# Patient Record
Sex: Male | Born: 1965 | Race: Black or African American | Hispanic: No | Marital: Married | Smoking: Never smoker
Health system: Southern US, Community
[De-identification: ages and names within clinical notes are randomized; demographics above are authoritative.]

## PROBLEM LIST (undated history)

## (undated) DIAGNOSIS — N289 Disorder of kidney and ureter, unspecified: Secondary | ICD-10-CM

## (undated) DIAGNOSIS — M779 Enthesopathy, unspecified: Secondary | ICD-10-CM

## (undated) DIAGNOSIS — M542 Cervicalgia: Secondary | ICD-10-CM

## (undated) DIAGNOSIS — I4891 Unspecified atrial fibrillation: Secondary | ICD-10-CM

## (undated) DIAGNOSIS — E119 Type 2 diabetes mellitus without complications: Secondary | ICD-10-CM

## (undated) DIAGNOSIS — I10 Essential (primary) hypertension: Secondary | ICD-10-CM

## (undated) DIAGNOSIS — M79603 Pain in arm, unspecified: Secondary | ICD-10-CM

## (undated) HISTORY — PX: NERVE SURGERY: SHX1016

---

## 2003-04-20 ENCOUNTER — Emergency Department (HOSPITAL_COMMUNITY): Admission: EM | Admit: 2003-04-20 | Discharge: 2003-04-20 | Payer: Self-pay | Admitting: Internal Medicine

## 2003-04-20 ENCOUNTER — Encounter: Payer: Self-pay | Admitting: *Deleted

## 2005-04-20 ENCOUNTER — Inpatient Hospital Stay (HOSPITAL_COMMUNITY): Admission: AD | Admit: 2005-04-20 | Discharge: 2005-04-22 | Payer: Self-pay | Admitting: Family Medicine

## 2005-04-21 ENCOUNTER — Ambulatory Visit: Payer: Self-pay | Admitting: *Deleted

## 2005-05-10 ENCOUNTER — Ambulatory Visit: Payer: Self-pay | Admitting: Cardiology

## 2007-12-26 ENCOUNTER — Emergency Department (HOSPITAL_COMMUNITY): Admission: EM | Admit: 2007-12-26 | Discharge: 2007-12-26 | Payer: Self-pay | Admitting: *Deleted

## 2008-06-14 ENCOUNTER — Ambulatory Visit (HOSPITAL_COMMUNITY): Admission: RE | Admit: 2008-06-14 | Discharge: 2008-06-14 | Payer: Self-pay | Admitting: Family Medicine

## 2008-06-14 ENCOUNTER — Encounter: Payer: Self-pay | Admitting: Orthopedic Surgery

## 2008-07-08 ENCOUNTER — Encounter: Payer: Self-pay | Admitting: Orthopedic Surgery

## 2008-07-15 ENCOUNTER — Ambulatory Visit: Payer: Self-pay | Admitting: Orthopedic Surgery

## 2008-07-15 DIAGNOSIS — M7580 Other shoulder lesions, unspecified shoulder: Secondary | ICD-10-CM

## 2008-07-15 DIAGNOSIS — M25519 Pain in unspecified shoulder: Secondary | ICD-10-CM | POA: Insufficient documentation

## 2008-07-15 DIAGNOSIS — S53106A Unspecified dislocation of unspecified ulnohumeral joint, initial encounter: Secondary | ICD-10-CM | POA: Insufficient documentation

## 2008-07-15 DIAGNOSIS — M25819 Other specified joint disorders, unspecified shoulder: Secondary | ICD-10-CM | POA: Insufficient documentation

## 2008-07-17 ENCOUNTER — Telehealth: Payer: Self-pay | Admitting: Orthopedic Surgery

## 2011-04-16 NOTE — Procedures (Signed)
NAMESEMAJ, COBURN                 ACCOUNT NO.:  [id number]   MEDICAL RECORD NO.:  [id number]          PATIENT TYPE:  INP   LOCATION:                                FACILITY:  [facility]   PHYSICIAN:  Donna Bernard, M.D.DATE OF BIRTH:  03/08/1966   DATE OF PROCEDURE:  04/22/2005  DATE OF DISCHARGE:  04/22/2005                                EKG INTERPRETATION   Electrocardiogram reveals normal sinus rhythm.  There are nonspecific ST-T  wave changes noted.  Voltage is on the low side on the limb leads.  There is  a small Q wave in lead 3 which is felt to be clinically nonsignificant.      Donna Bernard, M.D.  Electronically Signed     WSL/MEDQ  D:  09/06/2005  T:  09/06/2005  Job:  [id number]

## 2011-04-16 NOTE — Procedures (Signed)
NAMEKAUAN, KLOOSTERMAN                 ACCOUNT NO.:  [id number]   MEDICAL RECORD NO.:  [id number]          PATIENT TYPE:  INP   LOCATION:  IC07                          FACILITY:  [facility]   PHYSICIAN:  [place] Bing, M.D.  DATE OF BIRTH:  [birth year]   DATE OF PROCEDURE:  04/21/2005  DATE OF DISCHARGE:                                  ECHOCARDIOGRAM   PROCEDURE:  Echocardiogram.   CLINICAL DATA:  A 45 year old gentleman with atrial fibrillation.   DESCRIPTION OF PROCEDURE:  M-mode aorta 3.3.   Left atrium 3.9.   Septum 1.5.   Posterior wall 1.1.   LV diastole 4.0.   LV systole 2.7.   IMPRESSION:  1.  Technically adequate echocardiographic study.  2.  Normal left atrium, right atrium, and right ventricle.  3.  Normal aortic, mitral, and tricuspid, and pulmonic valve; flat mitral      coaptation without definite prolapse.  4.  Normal Doppler study with physiologic tricuspid regurgitation; estimated      right ventricular systolic pressure is normal.  5.  Normal proximal pulmonary artery.  6.  Normal left ventricular size; minimal septal hypertrophy, particularly      proximally.  Normal regional and global function.  7.  Normal inferior vena cava.      RR/MEDQ  D:  04/21/2005  T:  04/21/2005  Job:  [id number]

## 2011-04-16 NOTE — Group Therapy Note (Signed)
Philip Caldwell, Philip Caldwell                 ACCOUNT NO.:  [id number]   MEDICAL RECORD NO.:  [id number]          PATIENT TYPE:  INP   LOCATION:  IC07                          FACILITY:  [facility]   PHYSICIAN:  Scott A. Gerda Diss, MD    DATE OF BIRTH:  May 25, 1966   DATE OF PROCEDURE:  04/23/2004  DATE OF DISCHARGE:  04/22/2005                                   PROGRESS NOTE   HISTORY OF PRESENT ILLNESS:  The patient is now normal sinus rhythm. Denies  any chest tightness or pressure pain. No fever, chills, or vomiting.   PHYSICAL EXAMINATION:  LUNGS:  Clear.  HEART:  Regular. Chest wall non-tender. Normal EKG A/P. Atrial fibrillation  new onset, resolved.   PLAN:  Will have him see the cardiologist today but expect him to be able to  go home. Recommend followup with cardiologist as a direct, plus also  followup with Dr. Brett Canales within the next few weeks. For right now, to be on  aspirin only. Cardiologist does not recommend any ongoing medication.       SAL/MEDQ  D:  04/23/2005  T:  04/24/2005  Job:  [id number]

## 2011-04-16 NOTE — H&P (Signed)
Philip Caldwell, Philip Caldwell                 ACCOUNT NO.:  [id number]   MEDICAL RECORD NO.:  [id number]          PATIENT TYPE:  INP   LOCATION:  IC07                          FACILITY:  [facility]   PHYSICIAN:  Donna Bernard, M.D.DATE OF BIRTH:  December 20, 1965   DATE OF ADMISSION:  04/20/2005  DATE OF DISCHARGE:  LH                                HISTORY & PHYSICAL   CHIEF COMPLAINT:  Rapid heart rate.   SUBJECTIVE:  This patient is a 45 year old black male with a benign prior  medical history who presents to the office the day of admission with a sense  of a rapid heart rate. He woke up mid afternoon, noticing that his heart was  going quite fast and felt it skipping. He had no chest pain or pressure. No  shortness of breath. No nausea or diaphoresis. No history of prior  arrhythmia type problems or symptomatology. The patient notes recently he  has been under some increased stress with some issues at work and regarding  remodeling his home. He has no history of thyroid disease. No drug abuse. No  alcohol or tobacco abuse. Recently, we did a lipid profile with his total  cholesterol 190, triglycerides 167, HDL 43, LDL 114.   FAMILY HISTORY:  Interestingly, the patient has a sibling with heart rhythm  problems who recently required a pacemaker. Also positive for colon cancer,  brain cancer.   PRIOR SURGERIES:  None.   PRIOR HOSPITALIZATIONS:  None.   ALLERGIES:  PENICILLIN.   SOCIAL HISTORY:  The patient works as a Control and instrumentation engineer, is married, two  children, no tobacco or alcohol use is noted.   REVIEW OF SYSTEMS:  Otherwise negative.   PHYSICAL EXAMINATION:  VITAL SIGNS:  Blood pressure 126/70, heart rate 140  upon presentation and irregular.  GENERAL:  The patient is alert. No acute distress.  HEENT:  Normal fundi, disks sharp. TMs normal.  NECK:  Supple.  LUNGS:  Clear.  HEART:  Significant tachycardia noted. Irregular. No significant murmurs.  ABDOMEN:  Soft.  EXTREMITIES:  Normal.  NEUROLOGICAL:  Intact.   SIGNIFICANT LABORATORY DATA:  EKG:  Atrial fibrillation with an uncontrolled  rate of approximately 140 beats per minute. Blood work pending.   IMPRESSION:  New onset atrial fibrillation.   PLAN:  Check appropriate blood work. Schedule echocardiogram. Control rate  with Cardizem bolus and Cardizem drip. Initiate anticoagulant procedures  with Lovenox. [facility] cardiology consultation. Further orders in the chart.       WSL/MEDQ  D:  04/21/2005  T:  04/21/2005  Job:  [id number]

## 2011-04-16 NOTE — Consult Note (Signed)
NAME:  Philip Caldwell, Philip Caldwell                 ACCOUNT NO.:  [id number]   MEDICAL RECORD NO.:  [id number]          PATIENT TYPE:  INP   LOCATION:  IC07                          FACILITY:  [facility]   PHYSICIAN:  Vida Roller, M.D.   DATE OF BIRTH:  09-07-1966   DATE OF CONSULTATION:  04/21/2005  DATE OF DISCHARGE:                                   CONSULTATION   PRIMARY:  Dr. Lubertha South   HISTORY OF PRESENT ILLNESS:  Philip Caldwell is a 45 year old man with no  significant past medical history who presents with acute onset of  palpitations approximately 24 hours ago.  He was admitted by the  hospitalists group with rapid ventricular response, atrial fibrillation, and  was treated with subcutaneous Lovenox and IV diltiazem with subsequent  control of his heart rate.  We were asked to evaluate him for potential  treatment.  He states that he works nights for [employer] and that he has been  recently kind of burning the midnight oil as he says without a whole heck of  a lot of sleep and noticed when he awakened he had sudden onset of  palpitations.  No discomfort in his chest.  No PND, orthopnea.  No lower  extremity edema.  No syncope or presyncope.  Presented to the emergency  department for evaluation and was subsequently admitted.   PAST MEDICAL HISTORY:  Almost completely normal.  He has had no previous  surgeries.  He does not take any medications.  He does not smoke, drink, or  use illicit drugs.  He does not use any over-the-counter supplements.   FAMILY HISTORY:  His mother is alive and relatively healthy.  His father  died of cancer in his 31s.  He has one sister who has an arrhythmia problem  that required a pacemaker a history of thyroid problems, but no other  siblings with cardiovascular problems.   REVIEW OF SYSTEMS:  He denies any headache, visual changes, sinus  tenderness, sinus discharge, cough, production of sputum, shortness of  breath, wheezing.  He denies any thyroid problems,  problems with his neck,  difficulty swallowing.  No odynophagia or dysphasia.  He denies any  shortness of breath.  No wheezing, no sputum production.  No hemoptysis.  No  abdominal pain.  No hematemesis or melena.  No hematochezia.  No colitis,  jaundice.  No abdominal pain.  No lower extremity edema.  No musculoskeletal  problems.  No neurologic problems.  No skin rashes.  The remainder of review  of systems is negative.   PHYSICAL EXAMINATION:  VITAL SIGNS:  His heart rate is irregular, but around  60-70.  His blood pressure is 116/70.  His respirations were 14.  He is  afebrile.  HEENT:  Unremarkable.  NECK:  Supple.  There is no jugular venous distension, no carotid bruits.  His thyroid is normal size in the midline.  He has normal carotid upstrokes.  CHEST:  Clear to auscultation.  CARDIAC:  Irregular with no obvious murmur.  No third or fourth heart sound  is noted.  ABDOMEN:  Soft,  nontender with normoactive bowel sounds.  EXTREMITIES:  Lower extremities are without clubbing, cyanosis, or edema.  Pulses are 2+ throughout.  There are no bruits noted.  NEUROLOGIC:  Normal.  MUSCULOSKELETAL:  Normal.   LABORATORIES:  White blood cell count 8.9, H&H of 16 and 46, platelet count  340.  Sodium 134, potassium 3.4, chloride 103, bicarbonate 27, BUN 8,  creatinine 1.2, blood sugar 94.  Cardiac enzymes x2 were negative.  Chest x-  ray is unremarkable.  Abdominal x-ray shows a question of ileus.  CT scan of  the abdomen shows fatty liver and question of renal cysts.  There is some  question of pancreatitis on the abdominal scan, although it was not fairly  certain.  This was a definitive diagnosis.   CURRENT MEDICATIONS:  1.  Lovenox subcutaneous 1 mg/kg b.i.d.  2.  Potassium chloride 20 mEq a day.  3.  IV diltiazem.  4.  Tylenol p.r.n.   His electrocardiogram shows atrial fibrillation at a rate of 68 with normal  QRS duration at 78 milliseconds, QT corrected at 404.  His QRS axis  is  normal at 54.  He has no ischemic ST-T wave changes and there are no Q-waves  concerning for an old myocardial infarction.   ASSESSMENT:  1.  Atrial fibrillation, unknown etiology.  History of atrial fibrillation      with associated thyroid disease in a sister.  His heart rate is well-      controlled on IV diltiazem and he is adequately anticoagulated with      Lovenox although I am concerned that no PT/PTT was obtained prior to      starting the Lovenox.  2.  Hypokalemia, unknown etiology.  He is being replaced.  Probably would be      reasonable to recheck his potassium and his magnesium relatively soon      and continue to address this.  3.  Ileus on abdominal x-ray.  He does not appear to clinically have that      and he really denies any significant abdominal problems.  I am not sure      how to follow up on this and I will discuss this with his primary.   RECOMMENDATIONS:  1.  Check a PTT, probably check a urine drug screen as well.  2.  Check potassium and magnesium probably around noon today and then      supplemental those appropriately.  3.  Continue the Lovenox as soon as PTT is obtained.  4.  Start p.o. diltiazem and then titrate him off the IV diltiazem.  He      needs an echocardiogram.  We probably should check a set of thyroid      function studies and I would make him n.p.o. after midnight tonight and      if he is still in atrial fibrillation in the morning he can be      cardioverted without any particular concern if his surface      echocardiogram appears to be unremarkable.  The question regarding      whether to put him on Coumadin or not will be determined once the      echocardiogram has been reviewed.      JH/MEDQ  D:  04/21/2005  T:  04/21/2005  Job:  [id number]

## 2011-04-16 NOTE — Discharge Summary (Signed)
Philip Caldwell, Philip Caldwell                 ACCOUNT NO.:  [id number]   MEDICAL RECORD NO.:  [id number]          PATIENT TYPE:  IC07   LOCATION:                                FACILITY:  [facility]   PHYSICIAN:  Donna Bernard, M.D.DATE OF BIRTH:  06-Aug-1966   DATE OF ADMISSION:  DATE OF DISCHARGE:  05/25/2006LH                                 DISCHARGE SUMMARY   DISCHARGE DIAGNOSIS:  New-onset atrial fibrillation.   DISPOSITION:  Discharged to home.   DISCHARGE MEDICATIONS:  Enteric coated aspirin 325 mg daily.   HISTORY OF PRESENT ILLNESS:  Please see H&P as dictated.   HOSPITAL COURSE:  This patient is a 45 year old, African-American male with  a benign medical history who presented to the office on the day of admission  with the sense of a rapid heart rate.  He was noted to be significantly  tachycardic on exam at approximately 140 beats per minute and irregular.  An  EKG revealed new-onset atrial fibrillation.  The patient was admitted to the  hospital.  He was started on appropriate anticoagulation with Lovenox.  The  patient was given a Cardizem bolus and then drip to initiate control of the  heart rate.  Cardiac enzymes were done and these proved to be negative.  Potassium was shown to be borderline low at 3.4.  Based on this, potassium  was supplemented.  The cardiology folks were consulted as noted.  Echocardiogram revealed no significant abnormalities.  Potassium was  corrected with supplementation.  The patient on the second day of admission  spontaneously reverted to normal sinus rhythm and remained there.  On the  day of discharge, the patient was feeling fine.  Discharged home with  diagnosis and disposition as noted above.       WSL/MEDQ  D:  06/02/2005  T:  06/02/2005  Job:  [id number]

## 2012-06-10 ENCOUNTER — Encounter (HOSPITAL_COMMUNITY): Payer: Self-pay

## 2012-06-10 ENCOUNTER — Emergency Department (HOSPITAL_COMMUNITY)
Admission: EM | Admit: 2012-06-10 | Discharge: 2012-06-10 | Disposition: A | Discharge status: Home / Self Care | Payer: 59 | Attending: Emergency Medicine | Admitting: Emergency Medicine

## 2012-06-10 ENCOUNTER — Emergency Department (HOSPITAL_COMMUNITY): Payer: 59

## 2012-06-10 DIAGNOSIS — I4891 Unspecified atrial fibrillation: Secondary | ICD-10-CM | POA: Insufficient documentation

## 2012-06-10 DIAGNOSIS — J40 Bronchitis, not specified as acute or chronic: Secondary | ICD-10-CM | POA: Insufficient documentation

## 2012-06-10 DIAGNOSIS — J310 Chronic rhinitis: Secondary | ICD-10-CM

## 2012-06-10 HISTORY — DX: Unspecified atrial fibrillation: I48.91

## 2012-06-10 MED ORDER — PREDNISONE 20 MG PO TABS
60.0000 mg | ORAL_TABLET | ORAL | Status: AC
  Administered 2012-06-10: 60 mg via ORAL
  Filled 2012-06-10: qty 3

## 2012-06-10 MED ORDER — IPRATROPIUM BROMIDE 0.02 % IN SOLN
0.5000 mg | Freq: Once | RESPIRATORY_TRACT | Status: AC
  Administered 2012-06-10: 0.5 mg via RESPIRATORY_TRACT
  Filled 2012-06-10: qty 2.5

## 2012-06-10 MED ORDER — PREDNISONE 20 MG PO TABS: ORAL_TABLET | ORAL | Status: DC

## 2012-06-10 MED ORDER — ALBUTEROL SULFATE HFA 108 (90 BASE) MCG/ACT IN AERS: 2.0000 | INHALATION_SPRAY | RESPIRATORY_TRACT | Status: DC | PRN

## 2012-06-10 MED ORDER — ALBUTEROL SULFATE (5 MG/ML) 0.5% IN NEBU
5.0000 mg | INHALATION_SOLUTION | Freq: Once | RESPIRATORY_TRACT | Status: AC
  Administered 2012-06-10: 5 mg via RESPIRATORY_TRACT
  Filled 2012-06-10: qty 1

## 2012-06-10 NOTE — ED Notes (Signed)
Pt reports sinus and chest congestion since the beginning of July.  Reports was given an antibiotic and started feeling a little better.  Finished antibiotics and started feeling worse.  C/O chest pressure and difficulty breathing, and productive cough with clear sputum.

## 2012-06-10 NOTE — ED Notes (Signed)
Sick w/ sinus ans nasal congestion since July 1st, started on antibiotic by pmd, not coughing now, but feels worse.  No fever.

## 2012-06-10 NOTE — ED Provider Notes (Cosign Needed)
History   This chart was scribed for Ward Givens, MD by Sofie Rower. The patient was seen in room APA04/APA04 and the patient's care was started at 4:04 PM     CSN: [id number]  Arrival date & time 06/10/12  1535   First MD Initiated Contact with Patient 06/10/12 1550      Chief Complaint  Patient presents with  . Facial Pain  . Nasal Congestion    (Consider location/radiation/quality/duration/timing/severity/associated sxs/prior treatment) HPI  Philip Caldwell is a 46 y.o. male who presents to the Emergency Department complaining of nasal congestion onset two days ago with associated symptoms of sneezing, productive white cough, wheezing, chest feeling tight.  The pt informs the EDP that he went to see Dr. Gerda Diss July 1st, with symptoms of being stopped up (nasally), white nasal drainage difficulty breathing, productive white cough. Pt reports there was no wheezing noted, no chest x-ray performed, he was prescribed antibiotics, clarithromycin 500 mg daily for 10 days. Pt informs the EDP that he was feeling better but  that the last two days he has become worse, it has become increasingly difficult to breathe. Modifying factors include blowing nose which provides moderate relief.   Pt has a hx of atrial fibrillation, allergy to penicillin.   Pt denies fever, sore throat, nausea, vomiting, hx of asthma as a child. Pt has had wheezing in the past and used a inhaler.  PCP is Dr. Lubertha South   Past Medical History  Diagnosis Date  . A-fib     History reviewed. No pertinent past surgical history.  No family history on file.  History  Substance Use Topics  . Smoking status: Never Smoker   . Smokeless tobacco: Not on file  . Alcohol Use: No    Pt does not smoke.  Pt is employed as a Merchandiser, retail with [employer].   Review of Systems  All other systems reviewed and are negative.    10 Systems reviewed and all are negative for acute change except as noted in the HPI.    Allergies    Penicillins  Home Medications  No current outpatient prescriptions on file.  BP 150/84  Pulse 74  Temp 98.2 F (36.8 C) (Oral)  Resp 20  Ht 5\' 9"  (1.753 m)  Wt 185 lb (83.915 kg)  BMI 27.32 kg/m2  SpO2 100%  Vital signs normal    Physical Exam  Nursing note and vitals reviewed. Constitutional: He is oriented to person, place, and time. He appears well-developed and well-nourished.  HENT:  Head: Normocephalic and atraumatic.  Right Ear: External ear normal.  Left Ear: External ear normal.  Mouth/Throat: Oropharynx is clear and moist.       Nasal mucosa boggy, slightly inflamed.    Eyes: Conjunctivae and EOM are normal. Pupils are equal, round, and reactive to light.  Neck: Normal range of motion. Neck supple.  Cardiovascular: Normal rate and regular rhythm.  Exam reveals no gallop.   No murmur heard. Pulmonary/Chest: Effort normal and breath sounds normal. No respiratory distress. He has no wheezes. He has no rales.  Musculoskeletal: Normal range of motion.  Neurological: He is alert and oriented to person, place, and time.  Skin: Skin is warm and dry.  Psychiatric: He has a normal mood and affect. His behavior is normal.    ED Course  Procedures (including critical care time)  Medications  clarithromycin (BIAXIN) 500 MG tablet (not administered)  predniSONE (DELTASONE) tablet 60 mg (not administered)  albuterol (PROVENTIL HFA;VENTOLIN  HFA) 108 (90 BASE) MCG/ACT inhaler (not administered)  predniSONE (DELTASONE) 20 MG tablet (not administered)  albuterol (PROVENTIL) (5 MG/ML) 0.5% nebulizer solution 5 mg (5 mg Nebulization Given 06/10/12 1630)  ipratropium (ATROVENT) nebulizer solution 0.5 mg (0.5 mg Nebulization Given 06/10/12 1630)     4:10PM- EDP at bedside discusses treatment plan concerning breathing treatment.   5:09PM- EDP at bedside discusses treatment plan concerning x-ray results, application of steroids, application of inhaler. EDP evaluates pt breath  sounds, lungs clear, improved air movement. Pt reports he is breathing deeper, feeling better.    Dg Chest 2 View  06/10/2012  *RADIOLOGY REPORT*  Clinical Data: Cough and chest congestion.  CHEST - 2 VIEW  Comparison:  None.  Findings:  The heart size and mediastinal contours are within normal limits.  Both lungs are clear.  The visualized skeletal structures are unremarkable.  IMPRESSION: No active cardiopulmonary disease.  Original Report Authenticated By: Danae Orleans, M.D.      Date: 06/10/2012  Rate: 69  Rhythm: normal sinus rhythm  QRS Axis: normal  Intervals: normal  ST/T Wave abnormalities: normal  Conduction Disutrbances:none  Narrative Interpretation:   Old EKG Reviewed: unchanged from 12/26/2007     1. Bronchitis   2. Rhinitis    New Prescriptions   ALBUTEROL (PROVENTIL HFA;VENTOLIN HFA) 108 (90 BASE) MCG/ACT INHALER    Inhale 2 puffs into the lungs every 4 (four) hours as needed for wheezing or shortness of breath.   PREDNISONE (DELTASONE) 20 MG TABLET    Take 3 po QD x 2d starting tomorrow, then 2 po QD x 3d then 1 po QD x 3d    Plan discharge    MDM   I personally performed the services described in this documentation, which was scribed in my presence. The recorded information has been reviewed and considered.    Ward Givens, MD 06/10/12 1719

## 2012-12-09 ENCOUNTER — Other Ambulatory Visit: Payer: Self-pay

## 2012-12-09 ENCOUNTER — Encounter (HOSPITAL_COMMUNITY): Payer: Self-pay

## 2012-12-09 ENCOUNTER — Emergency Department (HOSPITAL_COMMUNITY)
Admission: EM | Admit: 2012-12-09 | Discharge: 2012-12-09 | Disposition: A | Discharge status: Home / Self Care | Payer: 59 | Attending: Emergency Medicine | Admitting: Emergency Medicine

## 2012-12-09 DIAGNOSIS — R110 Nausea: Secondary | ICD-10-CM | POA: Insufficient documentation

## 2012-12-09 DIAGNOSIS — R51 Headache: Secondary | ICD-10-CM

## 2012-12-09 DIAGNOSIS — I10 Essential (primary) hypertension: Secondary | ICD-10-CM

## 2012-12-09 LAB — COMPREHENSIVE METABOLIC PANEL
ALT: 36 U/L (ref 0–53)
Albumin: 4.1 g/dL (ref 3.5–5.2)
Alkaline Phosphatase: 83 U/L (ref 39–117)
BUN: 12 mg/dL (ref 6–23)
Chloride: 100 mEq/L (ref 96–112)
Glucose, Bld: 87 mg/dL (ref 70–99)
Potassium: 4.1 mEq/L (ref 3.5–5.1)
Sodium: 134 mEq/L — ABNORMAL LOW (ref 135–145)
Total Bilirubin: 0.4 mg/dL (ref 0.3–1.2)
Total Protein: 7.6 g/dL (ref 6.0–8.3)

## 2012-12-09 LAB — CBC WITH DIFFERENTIAL/PLATELET
Basophils Relative: 0 % (ref 0–1)
Eosinophils Relative: 2 % (ref 0–5)
Hemoglobin: 16.2 g/dL (ref 13.0–17.0)
Lymphs Abs: 2.7 10*3/uL (ref 0.7–4.0)
MCH: 29.5 pg (ref 26.0–34.0)
MCV: 80 fL (ref 78.0–100.0)
Monocytes Absolute: 1 10*3/uL (ref 0.1–1.0)
Monocytes Relative: 10 % (ref 3–12)
RBC: 5.5 MIL/uL (ref 4.22–5.81)
WBC: 10 10*3/uL (ref 4.0–10.5)

## 2012-12-09 MED ORDER — IBUPROFEN 800 MG PO TABS
800.0000 mg | ORAL_TABLET | Freq: Once | ORAL | Status: AC
  Administered 2012-12-09: 800 mg via ORAL

## 2012-12-09 MED ORDER — IBUPROFEN 800 MG PO TABS
ORAL_TABLET | ORAL | Status: AC
  Filled 2012-12-09: qty 1

## 2012-12-09 NOTE — ED Notes (Signed)
Spoke with Dr. Estell Harpin about pt's complaint, no CT ordered at this time.  Instructed to adminsiter ibuprofen 800mg .

## 2012-12-09 NOTE — ED Notes (Signed)
Pt reports bp has been elevated for past few days but worse today.  C/O headache that started around 0600 and nausea.  Reports bp at home 163/105.  Reports was diagnosed borderline htn but has never been put on any bp medication.

## 2012-12-09 NOTE — ED Provider Notes (Signed)
History    Scribed for Benny Lennert, MD, the patient was seen in room APA04/APA04. This chart was scribed by Katha Cabal.   CSN: [id number]  Arrival date & time 12/09/12  1300   First MD Initiated Contact with Patient 12/09/12 1417      Chief Complaint  Patient presents with  . Headache  . Nausea  . Hypertension    (Consider location/radiation/quality/duration/timing/severity/associated sxs/prior Treatment)  Benny Lennert, MD entered patient's room at 2:26 PM  Patient is a 47 y.o. male presenting with headaches and hypertension. The history is provided by the patient and a relative. No language interpreter was used.  Headache  This is a new problem. The current episode started more than 2 days ago. The problem occurs constantly. The problem has not changed since onset.Associated with: elevated blood pressure. The quality of the pain is described as throbbing. The pain is moderate. Associated symptoms include nausea. He has tried nothing for the symptoms.  Hypertension The problem has not changed since onset.Associated symptoms include headaches.    BP 163/105 at home.  Patient reports that past 2 weeks that BP has been elevated.  Patient has not been diagnosed with hypertension but reports that Dr. Gerda Diss wanted him to monitor his blood pressure.     PCP Harlow Asa, MD      Past Medical History  Diagnosis Date  . A-fib     History reviewed. No pertinent past surgical history.  No family history on file.  History  Substance Use Topics  . Smoking status: Never Smoker   . Smokeless tobacco: Not on file  . Alcohol Use: No      Review of Systems  Gastrointestinal: Positive for nausea.  Neurological: Positive for headaches.  All other systems reviewed and are negative.    Allergies  Penicillins  Home Medications   Current Outpatient Rx  Name  Route  Sig  Dispense  Refill  . VITAMIN C 500 MG PO TABS   Oral   Take 500 mg by mouth daily as  needed.           BP 150/103  Pulse 62  Temp 98.3 F (36.8 C) (Oral)  Resp 20  Ht 5\' 9"  (1.753 m)  Wt 180 lb (81.647 kg)  BMI 26.58 kg/m2  SpO2 95%  Physical Exam  Constitutional: He is oriented to person, place, and time. He appears well-developed.  HENT:  Head: Normocephalic and atraumatic.  Right Ear: Tympanic membrane normal.  Left Ear: Tympanic membrane normal.  Mouth/Throat: Oropharynx is clear and moist.  Eyes: Conjunctivae normal and EOM are normal. Pupils are equal, round, and reactive to light. No scleral icterus.  Neck: Neck supple. No thyromegaly present.  Cardiovascular: Normal rate and regular rhythm.  Exam reveals no gallop and no friction rub.   No murmur heard. Pulmonary/Chest: No stridor. He has no wheezes. He has no rales. He exhibits no tenderness.  Abdominal: He exhibits no distension. There is no tenderness. There is no rebound.  Musculoskeletal: Normal range of motion. He exhibits no edema.  Lymphadenopathy:    He has no cervical adenopathy.  Neurological: He is oriented to person, place, and time. Coordination normal.  Skin: No rash noted. No erythema.  Psychiatric: He has a normal mood and affect. His behavior is normal.    ED Course  Procedures (including critical care time)    DIAGNOSTIC STUDIES: Oxygen Saturation is 98% on room air normal by my interpretation.     COORDINATION  OF CARE: 2:32 PM  Physical exam complete.  Will order basic blood workup.   3:45 PM  Laboratory findings discussed with patient.  Plan to discharge patient.  Patient agrees with plan.        LABS / RADIOLOGY:   Labs Reviewed  CBC WITH DIFFERENTIAL - Abnormal; Notable for the following:    MCHC 36.8 (*)     All other components within normal limits  COMPREHENSIVE METABOLIC PANEL - Abnormal; Notable for the following:    Sodium 134 (*)     GFR calc non Af Amer 65 (*)     GFR calc Af Amer 75 (*)     All other components within normal limits   No results  found.      IMPRESSION: No diagnosis found.   NEW MEDICATIONS: New Prescriptions   No medications on file          MDM         The chart was scribed for me under my direct supervision.  I personally performed the history, physical, and medical decision making and all procedures in the evaluation of this patient.Benny Lennert, MD 12/09/12 [phone]

## 2013-05-09 ENCOUNTER — Emergency Department (HOSPITAL_COMMUNITY)
Admission: EM | Admit: 2013-05-09 | Discharge: 2013-05-09 | Disposition: A | Discharge status: Home / Self Care | Payer: 59 | Attending: Emergency Medicine | Admitting: Emergency Medicine

## 2013-05-09 ENCOUNTER — Encounter (HOSPITAL_COMMUNITY): Payer: Self-pay | Admitting: Emergency Medicine

## 2013-05-09 ENCOUNTER — Emergency Department (HOSPITAL_COMMUNITY): Payer: 59

## 2013-05-09 DIAGNOSIS — Z880 Allergy status to penicillin: Secondary | ICD-10-CM | POA: Insufficient documentation

## 2013-05-09 DIAGNOSIS — M2540 Effusion, unspecified joint: Secondary | ICD-10-CM | POA: Insufficient documentation

## 2013-05-09 DIAGNOSIS — Z8679 Personal history of other diseases of the circulatory system: Secondary | ICD-10-CM | POA: Insufficient documentation

## 2013-05-09 DIAGNOSIS — Z79899 Other long term (current) drug therapy: Secondary | ICD-10-CM | POA: Insufficient documentation

## 2013-05-09 DIAGNOSIS — M778 Other enthesopathies, not elsewhere classified: Secondary | ICD-10-CM

## 2013-05-09 DIAGNOSIS — M65839 Other synovitis and tenosynovitis, unspecified forearm: Secondary | ICD-10-CM | POA: Insufficient documentation

## 2013-05-09 MED ORDER — HYDROCODONE-ACETAMINOPHEN 5-325 MG PO TABS: ORAL_TABLET | ORAL | Status: DC

## 2013-05-09 MED ORDER — NAPROXEN 500 MG PO TABS: 500.0000 mg | ORAL_TABLET | Freq: Two times a day (BID) | ORAL | Status: DC

## 2013-05-09 NOTE — ED Provider Notes (Signed)
History     CSN: [id number]  Arrival date & time 05/09/13  1155   First MD Initiated Contact with Patient 05/09/13 1236      Chief Complaint  Patient presents with  . Wrist Pain    (Consider location/radiation/quality/duration/timing/severity/associated sxs/prior treatment) HPI Comments: Philip Caldwell is a 47 y.o. male who presents to the Emergency Department complaining of lateral left wrist pain for 2 weeks. States pain is worse with side-to-side movement of the wrist. He denies known injury. He also denies redness, numbness or swelling. He has not tried any home medications. He also denies any pain proximal to the wrist.  Patient is a 47 y.o. male presenting with wrist pain. The history is provided by the patient.  Wrist Pain Associated symptoms include arthralgias and joint swelling. Pertinent negatives include no chills or fever.    Past Medical History  Diagnosis Date  . A-fib     History reviewed. No pertinent past surgical history.  No family history on file.  History  Substance Use Topics  . Smoking status: Never Smoker   . Smokeless tobacco: Not on file  . Alcohol Use: No      Review of Systems  Constitutional: Negative for fever and chills.  Genitourinary: Negative for dysuria and difficulty urinating.  Musculoskeletal: Positive for joint swelling and arthralgias.  Skin: Negative for color change and wound.  All other systems reviewed and are negative.    Allergies  Penicillins  Home Medications   Current Outpatient Rx  Name  Route  Sig  Dispense  Refill  . verapamil (CALAN-SR) 180 MG CR tablet   Oral   Take 180 mg by mouth at bedtime.           BP 144/88  Pulse 73  Temp(Src) 98 F (36.7 C) (Oral)  Resp 20  Ht 5\' 9"  (1.753 m)  Wt 185 lb (83.915 kg)  BMI 27.31 kg/m2  SpO2 99%  Physical Exam  Nursing note and vitals reviewed. Constitutional: He is oriented to person, place, and time. He appears well-developed and well-nourished. No  distress.  HENT:  Head: Normocephalic and atraumatic.  Cardiovascular: Normal rate, regular rhythm, normal heart sounds and intact distal pulses.   No murmur heard. Pulmonary/Chest: Effort normal and breath sounds normal. No respiratory distress.  Musculoskeletal: Normal range of motion. He exhibits tenderness. He exhibits no edema.       Left wrist: He exhibits tenderness. He exhibits normal range of motion, no bony tenderness, no swelling, no effusion, no crepitus, no deformity and no laceration.       Arms: tenderness to palpation of the lateral left wrist .  Radial pulse is brisk, distal sensation intact.  CR< 2 sec.  No bruising, erythema or bony deformity.  Patient has full ROM. Positive Finkelstein test. No proximal tenderness  Neurological: He is alert and oriented to person, place, and time. He exhibits normal muscle tone. Coordination normal.  Skin: Skin is warm and dry.    ED Course  Procedures (including critical care time)  Labs Reviewed - No data to display Dg Wrist Complete Left  05/09/2013   *RADIOLOGY REPORT*  Clinical Data: Left wrist pain for 2 weeks.  LEFT WRIST - COMPLETE 3+ VIEW  Comparison: None.  Findings: Imaged bones, joints and soft tissues appear normal.  IMPRESSION: Negative exam.   Original Report Authenticated By: Holley Dexter, M.D.    Velcro wrist splint applied, pain improved, remains neurovascularly intact.    MDM  Pain is likely related to tendinitis. Do not suspect a septic joint. Patient agrees to elevate, ice, and wear the Velcro splint for at least one week. Have advised him to followup with Dr. Romeo Apple if the pain is not improving. I will treat with anti-inflammatory and Vicodin.      Bailyn Spackman L. Katty Fretwell, PA-C 05/09/13 1309

## 2013-05-09 NOTE — ED Notes (Signed)
Pt c/o left wrist pain x 2 weeks. Denies injury. C/m/s intact.

## 2013-05-09 NOTE — ED Notes (Signed)
Pt presents with left wrist pain x 2 weeks that has worsened. Denies injury/fall/trauma to said limb. No swelling noted.  Pt states pain is worse with abduction movement. NAD noted.

## 2013-05-09 NOTE — ED Provider Notes (Signed)
Medical screening examination/treatment/procedure(s) were performed by non-physician practitioner and as supervising physician I was immediately available for consultation/collaboration.   Benny Lennert, MD 05/09/13 1426

## 2013-06-25 ENCOUNTER — Other Ambulatory Visit: Payer: Self-pay | Admitting: Family Medicine

## 2014-04-02 ENCOUNTER — Emergency Department (HOSPITAL_COMMUNITY): Payer: 59

## 2014-04-02 ENCOUNTER — Emergency Department (HOSPITAL_COMMUNITY)
Admission: EM | Admit: 2014-04-02 | Discharge: 2014-04-02 | Disposition: A | Discharge status: Other Acute Inpatient Hospital | Payer: 59

## 2014-04-02 ENCOUNTER — Encounter (HOSPITAL_COMMUNITY): Payer: Self-pay | Admitting: Emergency Medicine

## 2014-04-02 ENCOUNTER — Emergency Department (HOSPITAL_COMMUNITY)
Admission: EM | Admit: 2014-04-02 | Discharge: 2014-04-02 | Disposition: A | Discharge status: Home / Self Care | Payer: 59 | Attending: Emergency Medicine | Admitting: Emergency Medicine

## 2014-04-02 DIAGNOSIS — S46812A Strain of other muscles, fascia and tendons at shoulder and upper arm level, left arm, initial encounter: Secondary | ICD-10-CM

## 2014-04-02 DIAGNOSIS — M47812 Spondylosis without myelopathy or radiculopathy, cervical region: Secondary | ICD-10-CM | POA: Insufficient documentation

## 2014-04-02 DIAGNOSIS — S43499A Other sprain of unspecified shoulder joint, initial encounter: Secondary | ICD-10-CM | POA: Insufficient documentation

## 2014-04-02 DIAGNOSIS — S46819A Strain of other muscles, fascia and tendons at shoulder and upper arm level, unspecified arm, initial encounter: Principal | ICD-10-CM

## 2014-04-02 DIAGNOSIS — Y929 Unspecified place or not applicable: Secondary | ICD-10-CM | POA: Insufficient documentation

## 2014-04-02 DIAGNOSIS — Z791 Long term (current) use of non-steroidal anti-inflammatories (NSAID): Secondary | ICD-10-CM | POA: Insufficient documentation

## 2014-04-02 DIAGNOSIS — Z880 Allergy status to penicillin: Secondary | ICD-10-CM | POA: Insufficient documentation

## 2014-04-02 DIAGNOSIS — R202 Paresthesia of skin: Secondary | ICD-10-CM

## 2014-04-02 DIAGNOSIS — R200 Anesthesia of skin: Secondary | ICD-10-CM

## 2014-04-02 DIAGNOSIS — X500XXA Overexertion from strenuous movement or load, initial encounter: Secondary | ICD-10-CM | POA: Insufficient documentation

## 2014-04-02 DIAGNOSIS — Y9389 Activity, other specified: Secondary | ICD-10-CM | POA: Insufficient documentation

## 2014-04-02 DIAGNOSIS — I10 Essential (primary) hypertension: Secondary | ICD-10-CM | POA: Insufficient documentation

## 2014-04-02 HISTORY — DX: Essential (primary) hypertension: I10

## 2014-04-02 MED ORDER — NAPROXEN 500 MG PO TABS: 500.0000 mg | ORAL_TABLET | Freq: Two times a day (BID) | ORAL | Status: DC

## 2014-04-02 MED ORDER — DIAZEPAM 5 MG/ML IJ SOLN
5.0000 mg | Freq: Once | INTRAMUSCULAR | Status: AC
  Administered 2014-04-02: 5 mg via INTRAMUSCULAR
  Filled 2014-04-02: qty 2

## 2014-04-02 MED ORDER — KETOROLAC TROMETHAMINE 60 MG/2ML IM SOLN
60.0000 mg | Freq: Once | INTRAMUSCULAR | Status: AC
  Administered 2014-04-02: 60 mg via INTRAMUSCULAR
  Filled 2014-04-02: qty 2

## 2014-04-02 MED ORDER — CYCLOBENZAPRINE HCL 5 MG PO TABS: 5.0000 mg | ORAL_TABLET | Freq: Three times a day (TID) | ORAL | Status: DC | PRN

## 2014-04-02 NOTE — ED Provider Notes (Signed)
CSN: [id number]     Arrival date & time 04/02/14  0718 History  This chart was scribed for Philip Norrie, MD by Eston Mould, ED Scribe. This patient was seen in room APA19/[facility]19 and the patient's care was started at 8:11 AM.    Chief Complaint  Patient presents with  . Arm Pain   The history is provided by the patient. No language interpreter was used.   HPI Comments: Philip Caldwell is a 48 y.o. male who presents to the Emergency Department complaining of sudden L arm pain with associated tingling and numbness that worsens with movement of neck that began last night. Pt states he woke up yesterday with a "stiff neck" to the L side and states last night while he was driving, reports having numbness with tingling sensation in L hand and arm. He denies having trouble opening doors or clothing himself but unsure if strength is normal. States he is still having neck stiffness at this time and denies hx of prior pain. Pt is a Librarian, academic of [employer] but denies any new activities or strenuous lifting while at work. No known injury.  Reports taking Verapamil daily but denies any other daily medications.Pt denies HA, blurred vision, n/v/d.  Dr. Francetta Found is his PCP.  Past Medical History  Diagnosis Date  . A-fib   . Hypertension    History reviewed. No pertinent past surgical history. History reviewed. No pertinent family history. History  Substance Use Topics  . Smoking status: Never Smoker   . Smokeless tobacco: Not on file  . Alcohol Use: No  employed  Review of Systems  Eyes: Negative for visual disturbance.  Gastrointestinal: Negative for nausea, vomiting and diarrhea.  Musculoskeletal: Positive for myalgias, neck pain and neck stiffness.  Neurological: Negative for weakness and headaches.  All other systems reviewed and are negative.  Allergies  Penicillins  Home Medications   Prior to Admission medications   Medication Sig Start Date End Date Taking? Authorizing Provider   HYDROcodone-acetaminophen (NORCO/VICODIN) 5-325 MG per tablet Take one-two tabs po q 4-6 hrs prn pain 05/09/13   Tammy L. Triplett, PA-C  naproxen (NAPROSYN) 500 MG tablet Take 1 tablet (500 mg total) by mouth 2 (two) times daily. 05/09/13   Tammy L. Triplett, PA-C  verapamil (CALAN-SR) 180 MG CR tablet TAKE 1 TABLET AT BEDTIME 06/25/13   [name], MD   BP 139/96  Pulse 79  Temp(Src) 98.3 F (36.8 C) (Oral)  Resp 18  Ht 5\' 9"  (1.753 m)  Wt 180 lb (81.647 kg)  BMI 26.57 kg/m2  SpO2 100%  Vital signs normal    Physical Exam  Nursing note and vitals reviewed. Constitutional: He is oriented to person, place, and time. He appears well-developed and well-nourished.  Non-toxic appearance. He does not appear ill. No distress.  HENT:  Head: Normocephalic and atraumatic.  Right Ear: External ear normal.  Left Ear: External ear normal.  Nose: Nose normal. No mucosal edema or rhinorrhea.  Mouth/Throat: Oropharynx is clear and moist and mucous membranes are normal. No dental abscesses or uvula swelling.  Eyes: Conjunctivae and EOM are normal. Pupils are equal, round, and reactive to light.  Neck: Normal range of motion and full passive range of motion without pain. Neck supple.    Non-tender c-spine. Tender along L trapezius. When looking R, feels pulling on L trapezius. Pain when looking L and looking upward in the L trapezius. Looking down causes stretching in the left trapezius.  Pulmonary/Chest: Effort normal  and breath sounds normal. No respiratory distress. He has no rhonchi. He exhibits no crepitus.  Abdominal: Normal appearance.  Musculoskeletal: Normal range of motion. He exhibits no edema and no tenderness.  Moves all extremities well.   Neurological: He is alert and oriented to person, place, and time. He has normal strength. No cranial nerve deficit.  Intact strength in shoulder to abduction/adduction, dorsiflexion and extension wrists , and flexion & extension of elbows  bilaterally. Equal strength.  Skin: Skin is warm, dry and intact. No rash noted. No erythema. No pallor.  Psychiatric: He has a normal mood and affect. His speech is normal and behavior is normal. His mood appears not anxious.   ED Course  Procedures   Medications  ketorolac (TORADOL) injection 60 mg (60 mg Intramuscular Given 04/02/14 0840)  diazepam (VALIUM) injection 5 mg (5 mg Intramuscular Given 04/02/14 0841)    DIAGNOSTIC STUDIES: Oxygen Saturation is 100% on RA, normal by my interpretation.    COORDINATION OF CARE: 8:17 AM-Discussed treatment plan which includes X-ray to R/O arthritis and pinched nerves. Will administer shots of Toradol and Vallum to improve pain. Pt agreed to plan.   9:50 Rechecked pt states he is better, does state his numbness is worse in his thumb.   Labs Review Labs Reviewed - No data to display  Imaging Review Dg Cervical Spine Complete  04/02/2014   CLINICAL DATA:  Neck pain.  Left arm numbness.  EXAM: CERVICAL SPINE  4+ VIEWS  COMPARISON:  None.  FINDINGS: Normal alignment. No fracture or prevertebral soft tissue swelling. Neural foraminal narrowing due to uncinate spurring and loss of their space height at C5-6 bilaterally, worse on the right. Lung apices clear. Negative odontoid. Negative cervicothoracic junction.  IMPRESSION: Cervical spondylosis at C5-6, right worse than left.   Electronically Signed   By: [name] M.D.   On: 04/02/2014 09:40     EKG Interpretation None      Date: 04/02/2014  Rate: 64  Rhythm: normal sinus rhythm  QRS Axis: normal  Intervals: normal  ST/T Wave abnormalities: normal  Conduction Disutrbances:none  Narrative Interpretation:   Old EKG Reviewed: unchanged     MDM   Final diagnoses:  Cervical spondylosis  Strain of left trapezius muscle  Numbness and tingling in left hand    New Prescriptions   CYCLOBENZAPRINE (FLEXERIL) 5 MG TABLET    Take 1 tablet (5 mg total) by mouth 3 (three) times daily as  needed for muscle spasms.   NAPROXEN (NAPROSYN) 500 MG TABLET    Take 1 tablet (500 mg total) by mouth 2 (two) times daily.    Plan discharge  [name], MD, FACEP   I personally performed the services described in this documentation, which was scribed in my presence. The recorded information has been reviewed and considered.  [name], MD, FACEP    Philip Norrie, MD 04/02/14 1003

## 2014-04-02 NOTE — ED Notes (Signed)
Pt reports woke up yesterday morning with "crick in his neck". Pt denies any known injury. Pt reports since last night numbness/tingling sensation has increased since last night. Pt denies any cp,sob, n/v,dizziness,changes in vision. Pt reports pain increased with movement of neck as well. Airway patent.

## 2014-04-02 NOTE — Discharge Instructions (Signed)
Ice packs and heat to the area. Take the medications as prescribed. Recheck if you aren't improving over the next week of if you get worse. If you continue to have problems with your neck, consider seeing Dr Ellene Route, a neurosurgeon, who is a specialist of the spine.

## 2014-04-08 ENCOUNTER — Ambulatory Visit (INDEPENDENT_AMBULATORY_CARE_PROVIDER_SITE_OTHER): Payer: 59 | Admitting: Family Medicine

## 2014-04-08 ENCOUNTER — Encounter: Payer: Self-pay | Admitting: Family Medicine

## 2014-04-08 VITALS — BP 138/68 | Ht 69.0 in | Wt 194.0 lb

## 2014-04-08 DIAGNOSIS — IMO0002 Reserved for concepts with insufficient information to code with codable children: Secondary | ICD-10-CM

## 2014-04-08 DIAGNOSIS — M792 Neuralgia and neuritis, unspecified: Secondary | ICD-10-CM

## 2014-04-08 MED ORDER — HYDROCODONE-ACETAMINOPHEN 5-325 MG PO TABS: 1.0000 | ORAL_TABLET | Freq: Four times a day (QID) | ORAL | Status: DC | PRN

## 2014-04-08 MED ORDER — PREDNISONE 20 MG PO TABS: ORAL_TABLET | ORAL | Status: DC

## 2014-04-08 NOTE — Progress Notes (Signed)
   Subjective:    Patient ID: Philip Caldwell, male    DOB: Dec 22, 1965, 48 y.o.   MRN: [id number]  [facility] Went to ED on 04/02/14 because he was having left arm numbness and tingling. Still having neck pain and numbness in arm. No better. Taking flexeril 5 BID and naproxen 500 Bid.   Deep pain in left lat neck and at times unbearable  Using heat and ice  Left hand somewhat weak  Hurts when turning the neck Has had numbness extend all the way into left hand. Accompanied by tingling. reclls no injry  No strong family history of disc disease. Review of Systems No headache no chest pain no back pain no abdominal pain ROS otherwise negative    Objective:   Physical Exam  Alert mild malaise. No acute distress. HEENT neck supple. Some pain with rotation of neck. Left arm strength completely intact. Reflexes intact. Sensation grossly intact at this time.      Assessment & Plan:  Impression nerve root irritation. Hopefully this represents temporary etiology. Discussed with patient. Plan stop Naprosyn start prednisone taper. Hydrocodone when necessary. Recheck in 2 weeks. Of note went to the emergency room at all he are notes were reviewed easily 25 minutes spent with patient most in discussion. WSL

## 2014-04-23 ENCOUNTER — Encounter: Payer: Self-pay | Admitting: Family Medicine

## 2014-04-23 ENCOUNTER — Ambulatory Visit (INDEPENDENT_AMBULATORY_CARE_PROVIDER_SITE_OTHER): Payer: 59 | Admitting: Family Medicine

## 2014-04-23 VITALS — BP 114/80 | Ht 69.0 in | Wt 191.0 lb

## 2014-04-23 DIAGNOSIS — M792 Neuralgia and neuritis, unspecified: Secondary | ICD-10-CM

## 2014-04-23 DIAGNOSIS — G542 Cervical root disorders, not elsewhere classified: Secondary | ICD-10-CM

## 2014-04-23 DIAGNOSIS — IMO0002 Reserved for concepts with insufficient information to code with codable children: Secondary | ICD-10-CM

## 2014-04-23 MED ORDER — CYCLOBENZAPRINE HCL 5 MG PO TABS: 5.0000 mg | ORAL_TABLET | Freq: Three times a day (TID) | ORAL | Status: DC | PRN

## 2014-04-23 MED ORDER — HYDROCODONE-ACETAMINOPHEN 5-325 MG PO TABS: 1.0000 | ORAL_TABLET | Freq: Four times a day (QID) | ORAL | Status: DC | PRN

## 2014-04-23 NOTE — Progress Notes (Signed)
   Subjective:    Patient ID: Philip Caldwell, male    DOB: 1966/02/02, 48 y.o.   MRN: [id number]  HPI Patient is here today for a follow up visit on neck pain. Patient states that the pain is about the same, no improvement noted. Patient states that the pain has now moved into his left shoulder and numbness is noted in his left arm also.   Patient states he has no other concerns at this time.   Numbness still going on, strength still intact  Pain impressive day and night  Pain his on the fourth  Naprosyn didn't help and now pred taper didn't  Review of Systems No chest pain no back pain no abdominal pain no change about habits no blood in stool ROS otherwise negative    Objective:   Physical Exam  Alert slight malaise no acute distress lungs clear heart rare rhythm neck supple left arm distal strength sensation intact shoulder good range of motion. Some diminished sensation in left hand. Hand strength appears okay. Numbness and tingling down into left hand when turning neck to the right      Assessment & Plan:  Impression neuropathic pain likely cervical disc disease and/or degenerative joint disease with secondary nerve root irritation discussed plan MRI of neck. Hydrocodone and Flexeril refilled. Local measures discussed further recommendations based results. WSL

## 2014-04-26 ENCOUNTER — Ambulatory Visit (HOSPITAL_COMMUNITY)
Admission: RE | Admit: 2014-04-26 | Discharge: 2014-04-26 | Disposition: A | Discharge status: Home / Self Care | Payer: 59 | Source: Ambulatory Visit | Attending: Family Medicine | Admitting: Family Medicine

## 2014-04-26 DIAGNOSIS — M5030 Other cervical disc degeneration, unspecified cervical region: Secondary | ICD-10-CM | POA: Insufficient documentation

## 2014-04-26 DIAGNOSIS — M542 Cervicalgia: Secondary | ICD-10-CM | POA: Insufficient documentation

## 2014-04-26 DIAGNOSIS — R209 Unspecified disturbances of skin sensation: Secondary | ICD-10-CM | POA: Insufficient documentation

## 2014-04-26 DIAGNOSIS — M5020 Other cervical disc displacement, unspecified cervical region: Secondary | ICD-10-CM | POA: Insufficient documentation

## 2014-04-29 NOTE — Addendum Note (Signed)
Addended by: Carmelina Noun on: 04/29/2014 11:54 AM   Modules accepted: Orders

## 2014-08-03 ENCOUNTER — Other Ambulatory Visit: Payer: Self-pay | Admitting: Family Medicine

## 2014-09-09 ENCOUNTER — Telehealth: Payer: Self-pay | Admitting: Family Medicine

## 2014-09-09 DIAGNOSIS — Z125 Encounter for screening for malignant neoplasm of prostate: Secondary | ICD-10-CM

## 2014-09-09 DIAGNOSIS — Z139 Encounter for screening, unspecified: Secondary | ICD-10-CM

## 2014-09-09 DIAGNOSIS — I10 Essential (primary) hypertension: Secondary | ICD-10-CM

## 2014-09-09 DIAGNOSIS — Z79899 Other long term (current) drug therapy: Secondary | ICD-10-CM

## 2014-09-09 NOTE — Telephone Encounter (Signed)
bloodwork orders are ready. Pt notified.  

## 2014-09-09 NOTE — Telephone Encounter (Signed)
bw orders for phy on 11/3  Last labs were pre epic, had some done via [facility] encounter Jan 1014   Call pt when labs sent

## 2014-09-09 NOTE — Telephone Encounter (Signed)
Lip liv m7 psa 

## 2014-09-11 LAB — BASIC METABOLIC PANEL
BUN: 10 mg/dL (ref 6–23)
CO2: 27 mEq/L (ref 19–32)
Calcium: 9.9 mg/dL (ref 8.4–10.5)
Chloride: 104 mEq/L (ref 96–112)
Creat: 1.22 mg/dL (ref 0.50–1.35)
GLUCOSE: 75 mg/dL (ref 70–99)
Potassium: 4.5 mEq/L (ref 3.5–5.3)
Sodium: 141 mEq/L (ref 135–145)

## 2014-09-11 LAB — HEPATIC FUNCTION PANEL
ALT: 16 U/L (ref 0–53)
AST: 18 U/L (ref 0–37)
Albumin: 4.3 g/dL (ref 3.5–5.2)
Alkaline Phosphatase: 70 U/L (ref 39–117)
BILIRUBIN DIRECT: 0.1 mg/dL (ref 0.0–0.3)
Indirect Bilirubin: 0.5 mg/dL (ref 0.2–1.2)
Total Bilirubin: 0.6 mg/dL (ref 0.2–1.2)
Total Protein: 7.1 g/dL (ref 6.0–8.3)

## 2014-09-11 LAB — LIPID PANEL
CHOLESTEROL: 186 mg/dL (ref 0–200)
HDL: 60 mg/dL (ref >39)
LDL Cholesterol: 109 mg/dL — ABNORMAL HIGH (ref 0–99)
Total CHOL/HDL Ratio: 3.1 Ratio
Triglycerides: 83 mg/dL (ref <150)
VLDL: 17 mg/dL (ref 0–40)

## 2014-09-12 LAB — PSA: PSA: 1.12 ng/mL (ref <=4.00)

## 2014-10-01 ENCOUNTER — Ambulatory Visit (HOSPITAL_COMMUNITY)
Admission: RE | Admit: 2014-10-01 | Discharge: 2014-10-01 | Disposition: A | Discharge status: Home / Self Care | Payer: 59 | Source: Ambulatory Visit | Attending: Family Medicine | Admitting: Family Medicine

## 2014-10-01 ENCOUNTER — Ambulatory Visit (INDEPENDENT_AMBULATORY_CARE_PROVIDER_SITE_OTHER): Payer: 59 | Admitting: Family Medicine

## 2014-10-01 ENCOUNTER — Encounter: Payer: Self-pay | Admitting: Family Medicine

## 2014-10-01 VITALS — BP 126/78 | Ht 69.0 in | Wt 188.0 lb

## 2014-10-01 DIAGNOSIS — M25532 Pain in left wrist: Secondary | ICD-10-CM

## 2014-10-01 DIAGNOSIS — M25572 Pain in left ankle and joints of left foot: Secondary | ICD-10-CM

## 2014-10-01 DIAGNOSIS — M79672 Pain in left foot: Secondary | ICD-10-CM

## 2014-10-01 DIAGNOSIS — Z0000 Encounter for general adult medical examination without abnormal findings: Secondary | ICD-10-CM

## 2014-10-01 DIAGNOSIS — M79642 Pain in left hand: Secondary | ICD-10-CM | POA: Diagnosis present

## 2014-10-01 DIAGNOSIS — R531 Weakness: Secondary | ICD-10-CM

## 2014-10-01 LAB — CBC WITH DIFFERENTIAL/PLATELET
BASOS ABS: 0.1 10*3/uL (ref 0.0–0.1)
Basophils Relative: 1 % (ref 0–1)
Eosinophils Absolute: 0.2 10*3/uL (ref 0.0–0.7)
Eosinophils Relative: 3 % (ref 0–5)
HCT: 42.9 % (ref 39.0–52.0)
Hemoglobin: 15.7 g/dL (ref 13.0–17.0)
Lymphocytes Relative: 26 % (ref 12–46)
Lymphs Abs: 2.1 10*3/uL (ref 0.7–4.0)
MCH: 29.1 pg (ref 26.0–34.0)
MCHC: 36.6 g/dL — ABNORMAL HIGH (ref 30.0–36.0)
MCV: 79.4 fL (ref 78.0–100.0)
Monocytes Absolute: 0.7 10*3/uL (ref 0.1–1.0)
Monocytes Relative: 9 % (ref 3–12)
NEUTROS ABS: 5 10*3/uL (ref 1.7–7.7)
NEUTROS PCT: 61 % (ref 43–77)
Platelets: 333 10*3/uL (ref 150–400)
RBC: 5.4 MIL/uL (ref 4.22–5.81)
RDW: 14.3 % (ref 11.5–15.5)
WBC: 8.2 10*3/uL (ref 4.0–10.5)

## 2014-10-01 LAB — TSH: TSH: 1.878 u[IU]/mL (ref 0.350–4.500)

## 2014-10-01 LAB — RHEUMATOID FACTOR: Rhuematoid fact SerPl-aCnc: <10 IU/mL (ref <=14)

## 2014-10-01 NOTE — Progress Notes (Signed)
Subjective:    Patient ID: Philip Caldwell, male    DOB: [birth year], 48 y.o.   MRN: [id number]  HPI The patient comes in today for a wellness visit.  A review of their health history was completed.  A review of medications was also completed.  Any needed refills: NO  Eating habits: Does not try to be health conscious  Falls/  MVA accidents in past few months: NO  Regular exercise: NO  Specialist pt sees on regular basis: NO  Preventative health issues were discussed.   Additional concerns: Refused flu vaccine. Wants to discuss weight loss. He lost 15 lbs and was not trying.  Left foot pain. He stepped on it wrong 2 weeks ago.   Top numbers 120s  Lost weight from 40 waist back to 38 waist.  c o pain in left foot  Joint pain for three to months, all over wrists and ankles.no hx of crippling arthritis. Noticeable when first gets up.  Thumb and index finger still numb,no losw of strength in the hand at this poingt. Feels at night some too.  No sig, no sig weight loss  fam hx of colon ca with mo's bro,  No reg exercise for a couple of yrs  Left lat foot pain feels with evry step, and notes it worse with certain motions. Stepped off of . Takes ibu prn    Results for orders placed or performed in visit on 09/09/14  Lipid panel  Result Value Ref Range   Cholesterol 186 0 - 200 mg/dL   Triglycerides 83 <150 mg/dL   HDL 60 >39 mg/dL   Total CHOL/HDL Ratio 3.1 Ratio   VLDL 17 0 - 40 mg/dL   LDL Cholesterol 109 (H) 0 - 99 mg/dL  Hepatic function panel  Result Value Ref Range   Total Bilirubin 0.6 0.2 - 1.2 mg/dL   Bilirubin, Direct 0.1 0.0 - 0.3 mg/dL   Indirect Bilirubin 0.5 0.2 - 1.2 mg/dL   Alkaline Phosphatase 70 39 - 117 U/L   AST 18 0 - 37 U/L   ALT 16 0 - 53 U/L   Total Protein 7.1 6.0 - 8.3 g/dL   Albumin 4.3 3.5 - 5.2 g/dL  Basic metabolic panel  Result Value Ref Range   Sodium 141 135 - 145 mEq/L   Potassium 4.5 3.5 - 5.3 mEq/L   Chloride 104 96 -  112 mEq/L   CO2 27 19 - 32 mEq/L   Glucose, Bld 75 70 - 99 mg/dL   BUN 10 6 - 23 mg/dL   Creat 1.22 0.50 - 1.35 mg/dL   Calcium 9.9 8.4 - 10.5 mg/dL  PSA  Result Value Ref Range   PSA 1.12 <=4.00 ng/mL     Pt mo was on thyr supp many yrs ago         Review of Systems  Constitutional: Negative for fever, activity change and appetite change.  HENT: Negative for congestion and rhinorrhea.   Eyes: Negative for discharge.  Respiratory: Negative for cough and wheezing.   Cardiovascular: Negative for chest pain.  Gastrointestinal: Negative for vomiting, abdominal pain and blood in stool.  Genitourinary: Negative for frequency and difficulty urinating.  Musculoskeletal: Negative for neck pain.  Skin: Negative for rash.  Allergic/Immunologic: Negative for environmental allergies and food allergies.  Neurological: Negative for weakness and headaches.  Psychiatric/Behavioral: Negative for agitation.  All other systems reviewed and are negative.      Objective:   Physical Exam  Constitutional: He appears well-developed and well-nourished.  HENT:  Head: Normocephalic and atraumatic.  Right Ear: External ear normal.  Left Ear: External ear normal.  Nose: Nose normal.  Mouth/Throat: Oropharynx is clear and moist.  Eyes: EOM are normal. Pupils are equal, round, and reactive to light.  Neck: Normal range of motion. Neck supple. No thyromegaly present.  Cardiovascular: Normal rate, regular rhythm and normal heart sounds.   No murmur heard. Pulmonary/Chest: Effort normal and breath sounds normal. No respiratory distress. He has no wheezes.  Abdominal: Soft. Bowel sounds are normal. He exhibits no distension and no mass. There is no tenderness.  Genitourinary: Penis normal.  Musculoskeletal: Normal range of motion. He exhibits no edema.  No obvious changes of arthritis or other noticeable joint abnormalities.Left lateral foot tenderness to deep palpation  Lymphadenopathy:    He has  no cervical adenopathy.  Neurological: He is alert. He exhibits normal muscle tone.  Sensation currently normal limits both hands.  Skin: Skin is warm and dry. No erythema.  Psychiatric: He has a normal mood and affect. His behavior is normal. Judgment normal.  Vitals reviewed.         Assessment & Plan:  Impression #1 wellness exam #2 multiple additional concerns. Including persistent left hand paresthesia post cervical spine injections., Left lateral foot tenderness after injury, 15 pounds weight loss with increased stress of latediffuse joint pain several months in duration. Worse in the morning with associated stiffness. Plan appropriate additional blood work and x-ray as indicated. Colonoscopy she given. Additional blood work. Current blood work reviewed. Follow-up in 2 weeks for continued workup. WSL

## 2014-10-02 LAB — SEDIMENTATION RATE: SED RATE: 4 mm/h (ref 0–16)

## 2014-10-04 ENCOUNTER — Telehealth: Payer: Self-pay | Admitting: *Deleted

## 2014-10-04 MED ORDER — BENZONATATE 100 MG PO CAPS: 100.0000 mg | ORAL_CAPSULE | Freq: Four times a day (QID) | ORAL | Status: DC | PRN

## 2014-10-04 MED ORDER — AZITHROMYCIN 250 MG PO TABS: ORAL_TABLET | ORAL | Status: DC

## 2014-10-04 NOTE — Telephone Encounter (Signed)
Meds sent to pharm. Pt notified.

## 2014-10-04 NOTE — Telephone Encounter (Signed)
z pk, tess perles 100 mg 24 one q 6hr pen xough

## 2014-10-04 NOTE — Telephone Encounter (Signed)
Patient seen Tuesday for PE- stared that night with cough congestion and fever- cough in congestion with color- request antibiotic and something for cough to [facility] [place]

## 2014-10-14 ENCOUNTER — Ambulatory Visit (INDEPENDENT_AMBULATORY_CARE_PROVIDER_SITE_OTHER): Payer: 59 | Admitting: Family Medicine

## 2014-10-14 ENCOUNTER — Encounter: Payer: Self-pay | Admitting: Family Medicine

## 2014-10-14 VITALS — BP 128/86 | Temp 98.7°F | Ht 69.0 in | Wt 189.0 lb

## 2014-10-14 DIAGNOSIS — M25562 Pain in left knee: Secondary | ICD-10-CM

## 2014-10-14 DIAGNOSIS — M25551 Pain in right hip: Secondary | ICD-10-CM

## 2014-10-14 DIAGNOSIS — M25552 Pain in left hip: Secondary | ICD-10-CM

## 2014-10-14 DIAGNOSIS — G8929 Other chronic pain: Secondary | ICD-10-CM | POA: Insufficient documentation

## 2014-10-14 DIAGNOSIS — J209 Acute bronchitis, unspecified: Secondary | ICD-10-CM

## 2014-10-14 DIAGNOSIS — M25561 Pain in right knee: Secondary | ICD-10-CM

## 2014-10-14 DIAGNOSIS — I10 Essential (primary) hypertension: Secondary | ICD-10-CM | POA: Insufficient documentation

## 2014-10-14 DIAGNOSIS — M25569 Pain in unspecified knee: Secondary | ICD-10-CM

## 2014-10-14 DIAGNOSIS — M25559 Pain in unspecified hip: Secondary | ICD-10-CM

## 2014-10-14 MED ORDER — LEVOFLOXACIN 500 MG PO TABS: 500.0000 mg | ORAL_TABLET | Freq: Every day | ORAL | Status: AC

## 2014-10-14 MED ORDER — BENZONATATE 100 MG PO CAPS: 100.0000 mg | ORAL_CAPSULE | Freq: Four times a day (QID) | ORAL | Status: DC | PRN

## 2014-10-14 NOTE — Progress Notes (Signed)
   Subjective:    Patient ID: Philip Caldwell, male    DOB: [birth year], 48 y.o.   MRN: [id number]  Cough This is a new problem. The current episode started 1 to 4 weeks ago. Associated symptoms include shortness of breath. Associated symptoms comments: congestion. Treatments tried: zpack, tessalon pearls. The treatment provided moderate relief.   Patient recently had productive cough. Follow viral syndrome. Now ongoing congested productive cough with element of burning in chest.  Has had difficulty with progressive arthralgias over the past several months. Blood work showed negative ESR negative rheumatoid factor and x-rays which showed no distracted of arthritis.   Patient also dealing with numbness and paresthesias and thumb and forefinger of left hand. Had cervical spine injection earlier in the summer. No weakness no pain. Wonders what to do now.  Compliant blood pressure medication. No obvious side effects.  Follow up on bloodwork and xrays for joint pain.     Review of Systems  Respiratory: Positive for cough and shortness of breath.   no headache no chest pain no back pain no abdominal pain no change in bowel habits no blood in stool     Objective:   Physical Exam Alert no apparent distress. HEENT normal. Lungs bronchial cough no true wheezes heart regular in rhythm. Pharynx normal. Left hand sensation slightly diminished, and forefinger strength intact pulses good negative Tinel's negative Phalen's sign good range of motion good strength proximally       Assessment & Plan:  Impression 1 persistent bronchitis post parainfluenza discussed #2 hypertension good control. #3 left hand paresthesias post cervical radiculopathy discussed #4 early osteoarthritis discussed plan exercise encourage. Antibiotics prescribed. Since Medicare discussed. Maintain same blood pressure medicine. Check every 6 months. No further injections at this time. And less pain or weakness ensues

## 2014-10-14 NOTE — Patient Instructions (Signed)
This is called parainfluenza

## 2014-10-26 ENCOUNTER — Other Ambulatory Visit: Payer: Self-pay | Admitting: Family Medicine

## 2015-07-03 ENCOUNTER — Telehealth: Payer: Self-pay | Admitting: Family Medicine

## 2015-07-03 ENCOUNTER — Other Ambulatory Visit: Payer: Self-pay | Admitting: Family Medicine

## 2015-07-03 NOTE — Telephone Encounter (Signed)
Pt would like to get a referral for his colonoscopy that he didn't get Done last year.   Doesn't care if it's [facility] or [facility], which ever one can get him  The soonest.

## 2015-07-03 NOTE — Telephone Encounter (Signed)
See my response re spouse

## 2015-07-03 NOTE — Telephone Encounter (Signed)
See note in wife, Jahe[facility] chart, # given for [facility] @ Dr. Olevia Perches office

## 2015-07-04 ENCOUNTER — Other Ambulatory Visit (INDEPENDENT_AMBULATORY_CARE_PROVIDER_SITE_OTHER): Payer: Self-pay | Admitting: *Deleted

## 2015-07-04 DIAGNOSIS — Z1211 Encounter for screening for malignant neoplasm of colon: Secondary | ICD-10-CM

## 2015-07-31 ENCOUNTER — Other Ambulatory Visit: Payer: Self-pay | Admitting: Family Medicine

## 2015-08-11 ENCOUNTER — Encounter: Payer: Self-pay | Admitting: Family Medicine

## 2015-08-11 ENCOUNTER — Ambulatory Visit (INDEPENDENT_AMBULATORY_CARE_PROVIDER_SITE_OTHER): Payer: 59 | Admitting: Family Medicine

## 2015-08-11 VITALS — BP 126/82 | Ht 69.0 in | Wt 191.2 lb

## 2015-08-11 DIAGNOSIS — I10 Essential (primary) hypertension: Secondary | ICD-10-CM

## 2015-08-11 DIAGNOSIS — Z0000 Encounter for general adult medical examination without abnormal findings: Secondary | ICD-10-CM

## 2015-08-11 DIAGNOSIS — Z125 Encounter for screening for malignant neoplasm of prostate: Secondary | ICD-10-CM | POA: Diagnosis not present

## 2015-08-11 DIAGNOSIS — Z79899 Other long term (current) drug therapy: Secondary | ICD-10-CM | POA: Diagnosis not present

## 2015-08-11 DIAGNOSIS — Z13220 Encounter for screening for lipoid disorders: Secondary | ICD-10-CM

## 2015-08-11 MED ORDER — VERAPAMIL HCL ER 180 MG PO TBCR: 180.0000 mg | EXTENDED_RELEASE_TABLET | Freq: Every day | ORAL | Status: DC

## 2015-08-11 NOTE — Progress Notes (Signed)
   Subjective:    Patient ID: Philip Caldwell, male    DOB: May 03, 1966, 49 y.o.   MRN: [id number]  HPI  The patient comes in today for a wellness visit.    A review of their health history was completed.  A review of medications was also completed.  Any needed refills; Yes  Eating habits: good  Falls/  MVA accidents in past few months: None  Regular exercise: none  Specialist pt sees on regular basis: none  Preventative health issues were discussed.   Additional concerns: Patient would like to discuss "knot in armpit" this visit. Patient states that knots are painful at times. Started tender like a boil, then finally wen t away, felt some discomfort where it is now  BP cked at home. Overall numbers are good 130 or so syst and 97J diastolic. Does not miss meds.  Trying to wwach salt intake, not exrcising much at this time  Pt has nt had colon yet, sched for November,   Review of Systems  Constitutional: Negative for fever, activity change and appetite change.  HENT: Negative for congestion and rhinorrhea.   Eyes: Negative for discharge.  Respiratory: Negative for cough and wheezing.   Cardiovascular: Negative for chest pain.  Gastrointestinal: Negative for vomiting, abdominal pain and blood in stool.  Genitourinary: Negative for frequency and difficulty urinating.  Musculoskeletal: Negative for neck pain.  Skin: Negative for rash.  Allergic/Immunologic: Negative for environmental allergies and food allergies.  Neurological: Negative for weakness and headaches.  Psychiatric/Behavioral: Negative for agitation.  All other systems reviewed and are negative.      Objective:   Physical Exam  Constitutional: He appears well-developed and well-nourished.  HENT:  Head: Normocephalic and atraumatic.  Right Ear: External ear normal.  Left Ear: External ear normal.  Nose: Nose normal.  Mouth/Throat: Oropharynx is clear and moist.  Eyes: EOM are normal. Pupils are equal,  round, and reactive to light.  Neck: Normal range of motion. Neck supple. No thyromegaly present.  Cardiovascular: Normal rate, regular rhythm and normal heart sounds.   No murmur heard. Pulmonary/Chest: Effort normal and breath sounds normal. No respiratory distress. He has no wheezes.  Abdominal: Soft. Bowel sounds are normal. He exhibits no distension and no mass. There is no tenderness.  Genitourinary: Penis normal.  Musculoskeletal: Normal range of motion. He exhibits no edema.  Lymphadenopathy:    He has no cervical adenopathy.  Neurological: He is alert. He exhibits normal muscle tone.  Skin: Skin is warm and dry. No erythema.   2 discrete subcutaneous nodules, tender in nature, within the sob dermis  Psychiatric: He has a normal mood and affect. His behavior is normal. Judgment normal.  Vitals reviewed.         Assessment & Plan:  Impression #1 wellness exam #2 hypertension good control discussed #3 intermittent infected sweat/oil glands. Discussed. Not enough for medications now. Plan diet discussed exercise discussed. Appropriate blood work. Blood pressure medicines refilled. If infection worsens call back and we will call in antibiotics on occasion [facility]

## 2015-08-13 LAB — LIPID PANEL
Chol/HDL Ratio: 5 ratio units (ref 0.0–5.0)
Cholesterol, Total: 216 mg/dL — ABNORMAL HIGH (ref 100–199)
HDL: 43 mg/dL (ref >39)
LDL Calculated: 109 mg/dL — ABNORMAL HIGH (ref 0–99)
Triglycerides: 319 mg/dL — ABNORMAL HIGH (ref 0–149)
VLDL Cholesterol Cal: 64 mg/dL — ABNORMAL HIGH (ref 5–40)

## 2015-08-13 LAB — HEPATIC FUNCTION PANEL
ALT: 19 IU/L (ref 0–44)
AST: 20 IU/L (ref 0–40)
Albumin: 4.1 g/dL (ref 3.5–5.5)
Alkaline Phosphatase: 88 IU/L (ref 39–117)
Bilirubin Total: 0.3 mg/dL (ref 0.0–1.2)
Bilirubin, Direct: 0.08 mg/dL (ref 0.00–0.40)
Total Protein: 6.9 g/dL (ref 6.0–8.5)

## 2015-08-13 LAB — BASIC METABOLIC PANEL
BUN/Creatinine Ratio: 6 — ABNORMAL LOW (ref 9–20)
BUN: 7 mg/dL (ref 6–24)
CHLORIDE: 99 mmol/L (ref 97–108)
CO2: 26 mmol/L (ref 18–29)
CREATININE: 1.1 mg/dL (ref 0.76–1.27)
Calcium: 9.4 mg/dL (ref 8.7–10.2)
GFR calc Af Amer: 89 mL/min/{1.73_m2} (ref >59)
GFR calc non Af Amer: 77 mL/min/{1.73_m2} (ref >59)
Glucose: 91 mg/dL (ref 65–99)
Potassium: 4.3 mmol/L (ref 3.5–5.2)
Sodium: 139 mmol/L (ref 134–144)

## 2015-08-13 LAB — PSA: Prostate Specific Ag, Serum: 1.5 ng/mL (ref 0.0–4.0)

## 2015-08-14 ENCOUNTER — Encounter: Payer: Self-pay | Admitting: Family Medicine

## 2015-09-02 ENCOUNTER — Telehealth (INDEPENDENT_AMBULATORY_CARE_PROVIDER_SITE_OTHER): Payer: Self-pay | Admitting: *Deleted

## 2015-09-02 DIAGNOSIS — Z1211 Encounter for screening for malignant neoplasm of colon: Secondary | ICD-10-CM

## 2015-09-02 NOTE — Telephone Encounter (Signed)
Patient need suprep ?

## 2015-09-05 MED ORDER — SUPREP BOWEL PREP KIT 17.5-3.13-1.6 GM/177ML PO SOLN: 1.0000 | Freq: Once | ORAL | Status: DC

## 2015-09-10 ENCOUNTER — Telehealth (INDEPENDENT_AMBULATORY_CARE_PROVIDER_SITE_OTHER): Payer: Self-pay | Admitting: *Deleted

## 2015-09-10 NOTE — Telephone Encounter (Signed)
Referring MD/PCP: steve luking   Procedure: tcs  Reason/Indication:  screening  Has patient had this procedure before?  no  If so, when, by whom and where?    Is there a family history of colon cancer?  Not sure  Who?  What age when diagnosed?    Is patient diabetic?   no      Does patient have prosthetic heart valve?  no  Do you have a pacemaker?  no  Has patient ever had endocarditis? no  Has patient had joint replacement within last 12 months?  no  Does patient tend to be constipated or take laxatives? no  Does patient have a history of alcohol/drug use? no  Is patient on Coumadin, Plavix and/or Aspirin? no  Medications: verapamil 180 mg daily  Allergies: pcn  Medication Adjustment:   Procedure date & time: 10/08/15 at 1030

## 2015-09-11 NOTE — Telephone Encounter (Signed)
agree

## 2015-09-29 ENCOUNTER — Telehealth (INDEPENDENT_AMBULATORY_CARE_PROVIDER_SITE_OTHER): Payer: Self-pay | Admitting: *Deleted

## 2015-09-29 DIAGNOSIS — Z1211 Encounter for screening for malignant neoplasm of colon: Secondary | ICD-10-CM

## 2015-09-29 NOTE — Telephone Encounter (Signed)
Patient need suprep, pharmacy didn't get

## 2015-09-30 MED ORDER — SUPREP BOWEL PREP KIT 17.5-3.13-1.6 GM/177ML PO SOLN: 1.0000 | Freq: Once | ORAL | Status: DC

## 2015-10-08 ENCOUNTER — Encounter (HOSPITAL_COMMUNITY)
Admission: RE | Disposition: A | Discharge status: Home / Self Care | Payer: Self-pay | Source: Ambulatory Visit | Attending: Internal Medicine

## 2015-10-08 ENCOUNTER — Encounter (HOSPITAL_COMMUNITY): Payer: Self-pay | Admitting: *Deleted

## 2015-10-08 ENCOUNTER — Ambulatory Visit (HOSPITAL_COMMUNITY)
Admission: RE | Admit: 2015-10-08 | Discharge: 2015-10-08 | Disposition: A | Discharge status: Home / Self Care | Payer: 59 | Source: Ambulatory Visit | Attending: Internal Medicine | Admitting: Internal Medicine

## 2015-10-08 DIAGNOSIS — I4891 Unspecified atrial fibrillation: Secondary | ICD-10-CM | POA: Diagnosis not present

## 2015-10-08 DIAGNOSIS — Z1211 Encounter for screening for malignant neoplasm of colon: Secondary | ICD-10-CM | POA: Diagnosis not present

## 2015-10-08 DIAGNOSIS — D124 Benign neoplasm of descending colon: Secondary | ICD-10-CM | POA: Insufficient documentation

## 2015-10-08 DIAGNOSIS — Z79899 Other long term (current) drug therapy: Secondary | ICD-10-CM | POA: Diagnosis not present

## 2015-10-08 DIAGNOSIS — I10 Essential (primary) hypertension: Secondary | ICD-10-CM | POA: Diagnosis not present

## 2015-10-08 DIAGNOSIS — K644 Residual hemorrhoidal skin tags: Secondary | ICD-10-CM | POA: Diagnosis not present

## 2015-10-08 DIAGNOSIS — K648 Other hemorrhoids: Secondary | ICD-10-CM | POA: Diagnosis not present

## 2015-10-08 HISTORY — PX: COLONOSCOPY: SHX5424

## 2015-10-08 SURGERY — COLONOSCOPY
Anesthesia: Moderate Sedation

## 2015-10-08 MED ORDER — MEPERIDINE HCL 50 MG/ML IJ SOLN
INTRAMUSCULAR | Status: DC | PRN
  Administered 2015-10-08 (×2): 25 mg via INTRAVENOUS

## 2015-10-08 MED ORDER — MEPERIDINE HCL 50 MG/ML IJ SOLN
INTRAMUSCULAR | Status: AC
  Filled 2015-10-08: qty 1

## 2015-10-08 MED ORDER — SODIUM CHLORIDE 0.9 % IV SOLN
INTRAVENOUS | Status: DC
  Administered 2015-10-08: 1000 mL via INTRAVENOUS

## 2015-10-08 MED ORDER — MIDAZOLAM HCL 5 MG/5ML IJ SOLN
INTRAMUSCULAR | Status: DC | PRN
  Administered 2015-10-08: 1 mg via INTRAVENOUS
  Administered 2015-10-08 (×3): 2 mg via INTRAVENOUS
  Administered 2015-10-08: 3 mg via INTRAVENOUS

## 2015-10-08 MED ORDER — MIDAZOLAM HCL 5 MG/5ML IJ SOLN
INTRAMUSCULAR | Status: AC
  Filled 2015-10-08: qty 10

## 2015-10-08 MED ORDER — STERILE WATER FOR IRRIGATION IR SOLN
Status: DC | PRN
  Administered 2015-10-08: 2.5 mL

## 2015-10-08 NOTE — Discharge Instructions (Signed)
Resume usual medications and diet. °No driving for 24 hours. °Physician will call with biopsy results. ° ° °Colonoscopy, Care After °These instructions give you information on caring for yourself after your procedure. Your doctor may also give you more specific instructions. Call your doctor if you have any problems or questions after your procedure. °HOME CARE °· Do not drive for 24 hours. °· Do not sign important papers or use machinery for 24 hours. °· You may shower. °· You may go back to your usual activities, but go slower for the first 24 hours. °· Take rest breaks often during the first 24 hours. °· Walk around or use warm packs on your belly (abdomen) if you have belly cramping or gas. °· Drink enough fluids to keep your pee (urine) clear or pale yellow. °· Resume your normal diet. Avoid heavy or fried foods. °· Avoid drinking alcohol for 24 hours or as told by your doctor. °· Only take medicines as told by your doctor. °If a tissue sample (biopsy) was taken during the procedure:  °· Do not take aspirin or blood thinners for 7 days, or as told by your doctor. °· Do not drink alcohol for 7 days, or as told by your doctor. °· Eat soft foods for the first 24 hours. °GET HELP IF: °You still have a small amount of blood in your poop (stool) 2-3 days after the procedure. °GET HELP RIGHT AWAY IF: °· You have more than a small amount of blood in your poop. °· You see clumps of tissue (blood clots) in your poop. °· Your belly is puffy (swollen). °· You feel sick to your stomach (nauseous) or throw up (vomit). °· You have a fever. °· You have belly pain that gets worse and medicine does not help. °MAKE SURE YOU: °· Understand these instructions. °· Will watch your condition. °· Will get help right away if you are not doing well or get worse. °  °This information is not intended to replace advice given to you by your health care provider. Make sure you discuss any questions you have with your health care provider. °    °Document Released: 12/18/2010 Document Revised: 11/20/2013 Document Reviewed: 07/23/2013 °Elsevier Interactive Patient Education ©2016 [employer] ° ° °Hemorrhoids °Hemorrhoids are swollen veins around the rectum or anus. There are two types of hemorrhoids:  °· Internal hemorrhoids. These occur in the veins just inside the rectum. They may poke through to the outside and become irritated and painful. °· External hemorrhoids. These occur in the veins outside the anus and can be felt as a painful swelling or hard lump near the anus. °CAUSES °· Pregnancy.   °· Obesity.   °· Constipation or diarrhea.   °· Straining to have a bowel movement.   °· Sitting for long periods on the toilet. °· Heavy lifting or other activity that caused you to strain. °· Anal intercourse. °SYMPTOMS  °· Pain.   °· Anal itching or irritation.   °· Rectal bleeding.   °· Fecal leakage.   °· Anal swelling.   °· One or more lumps around the anus.   °DIAGNOSIS  °Your caregiver may be able to diagnose hemorrhoids by visual examination. Other examinations or tests that may be performed include:  °· Examination of the rectal area with a gloved hand (digital rectal exam).   °· Examination of anal canal using a small tube (scope).   °· A blood test if you have lost a significant amount of blood. °· A test to look inside the colon (sigmoidoscopy or colonoscopy). °TREATMENT °Most   hemorrhoids can be treated at home. However, if symptoms do not seem to be getting better or if you have a lot of rectal bleeding, your caregiver may perform a procedure to help make the hemorrhoids get smaller or remove them completely. Possible treatments include:  °· Placing a rubber band at the base of the hemorrhoid to cut off the circulation (rubber band ligation).   °· Injecting a chemical to shrink the hemorrhoid (sclerotherapy).   °· Using a tool to burn the hemorrhoid (infrared light therapy).   °· Surgically removing the hemorrhoid (hemorrhoidectomy).   °· Stapling  the hemorrhoid to block blood flow to the tissue (hemorrhoid stapling).   °HOME CARE INSTRUCTIONS  °· Eat foods with fiber, such as whole grains, beans, nuts, fruits, and vegetables. Ask your doctor about taking products with added fiber in them (fiber supplements). °· Increase fluid intake. Drink enough water and fluids to keep your urine clear or pale yellow.   °· Exercise regularly.   °· Go to the bathroom when you have the urge to have a bowel movement. Do not wait.   °· Avoid straining to have bowel movements.   °· Keep the anal area dry and clean. Use wet toilet paper or moist towelettes after a bowel movement.   °· Medicated creams and suppositories may be used or applied as directed.   °· Only take over-the-counter or prescription medicines as directed by your caregiver.   °· Take warm sitz baths for 15-20 minutes, 3-4 times a day to ease pain and discomfort.   °· Place ice packs on the hemorrhoids if they are tender and swollen. Using ice packs between sitz baths may be helpful.   °¨ Put ice in a plastic bag.   °¨ Place a towel between your skin and the bag.   °¨ Leave the ice on for 15-20 minutes, 3-4 times a day.   °· Do not use a donut-shaped pillow or sit on the toilet for long periods. This increases blood pooling and pain.   °SEEK MEDICAL CARE IF: °· You have increasing pain and swelling that is not controlled by treatment or medicine. °· You have uncontrolled bleeding. °· You have difficulty or you are unable to have a bowel movement. °· You have pain or inflammation outside the area of the hemorrhoids. °MAKE SURE YOU: °· Understand these instructions. °· Will watch your condition. °· Will get help right away if you are not doing well or get worse. °  °This information is not intended to replace advice given to you by your health care provider. Make sure you discuss any questions you have with your health care provider. °  °Document Released: 11/12/2000 Document Revised: 11/01/2012 Document Reviewed:  09/19/2012 °Elsevier Interactive Patient Education ©2016 [employer] ° ° °Colon Polyps °Polyps are lumps of extra tissue growing inside the body. Polyps can grow in the large intestine (colon). Most colon polyps are noncancerous (benign). However, some colon polyps can become cancerous over time. Polyps that are larger than a pea may be harmful. To be safe, caregivers remove and test all polyps. °CAUSES  °Polyps form when mutations in the genes cause your cells to grow and divide even though no more tissue is needed. °RISK FACTORS °There are a number of risk factors that can increase your chances of getting colon polyps. They include: °· Being older than 50 years. °· Family history of colon polyps or colon cancer. °· Long-term colon diseases, such as colitis or Crohn disease. °· Being overweight. °· Smoking. °· Being inactive. °· Drinking too much alcohol. °SYMPTOMS  °Most small polyps do not   cause symptoms. If symptoms are present, they may include: °· Blood in the stool. The stool may look dark red or black. °· Constipation or diarrhea that lasts longer than 1 week. °DIAGNOSIS °People often do not know they have polyps until their caregiver finds them during a regular checkup. Your caregiver can use 4 tests to check for polyps: °· Digital rectal exam. The caregiver wears gloves and feels inside the rectum. This test would find polyps only in the rectum. °· Barium enema. The caregiver puts a liquid called barium into your rectum before taking X-rays of your colon. Barium makes your colon look white. Polyps are dark, so they are easy to see in the X-ray pictures. °· Sigmoidoscopy. A thin, flexible tube (sigmoidoscope) is placed into your rectum. The sigmoidoscope has a light and tiny camera in it. The caregiver uses the sigmoidoscope to look at the last third of your colon. °· Colonoscopy. This test is like sigmoidoscopy, but the caregiver looks at the entire colon. This is the most common method for finding and  removing polyps. °TREATMENT  °Any polyps will be removed during a sigmoidoscopy or colonoscopy. The polyps are then tested for cancer. °PREVENTION  °To help lower your risk of getting more colon polyps: °· Eat plenty of fruits and vegetables. Avoid eating fatty foods. °· Do not smoke. °· Avoid drinking alcohol. °· Exercise every day. °· Lose weight if recommended by your caregiver. °· Eat plenty of calcium and folate. Foods that are rich in calcium include milk, cheese, and broccoli. Foods that are rich in folate include chickpeas, kidney beans, and spinach. °HOME CARE INSTRUCTIONS °Keep all follow-up appointments as directed by your caregiver. You may need periodic exams to check for polyps. °SEEK MEDICAL CARE IF: °You notice bleeding during a bowel movement. °  °This information is not intended to replace advice given to you by your health care provider. Make sure you discuss any questions you have with your health care provider. °  °Document Released: 08/11/2004 Document Revised: 12/06/2014 Document Reviewed: 01/25/2012 °Elsevier Interactive Patient Education ©2016 [employer] ° °

## 2015-10-08 NOTE — Op Note (Signed)
COLONOSCOPY PROCEDURE REPORT  PATIENT:  Philip Caldwell  MR#:  [id number] Birthdate:  June 18, 1966, 49 y.o., male Endoscopist:  Dr. Rogene Houston, MD Referred By:  Dr. Kelle Darting, MD Procedure Date: 10/08/2015  Procedure:   Colonoscopy  Indications:  Patient is 49 year old African-American male who is here for average risk screening colonoscopy  Informed Consent:  The procedure and risks were reviewed with the patient and informed consent was obtained.  Medications:  Demerol 50 mg IV Versed 10 mg IV  Description of procedure:  After a digital rectal exam was performed, that colonoscope was advanced from the anus through the rectum and colon to the area of the cecum, ileocecal valve and appendiceal orifice. The cecum was deeply intubated. These structures were well-seen and photographed for the record. From the level of the cecum and ileocecal valve, the scope was slowly and cautiously withdrawn. The mucosal surfaces were carefully surveyed utilizing scope tip to flexion to facilitate fold flattening as needed. The scope was pulled down into the rectum where a thorough exam including retroflexion was performed.  Findings:   Prep satisfactory. He had stool coating mucosa of cecum and ascending colon and landmarks well seen after vigorous washing. Normal mucosa of cecum, ascending colon, hepatic flexure, transverse colon, splenic flexure and sigmoid colon. 5 mm polyp cold snare from descending colon. Normal rectal mucosa. Small hemorrhoids below the dentate line.   Therapeutic/Diagnostic Maneuvers Performed:   None  Complications:  None  EBL: none  Cecal Withdrawal Time:  11 minutes  Impression:  Examination performed to cecum. 5 mm polyp cold snare from descending colon. Small external hemorrhoids.  Recommendations:  Standard instructions given. I will contact patient with biopsy results and further recommendations.  Finnbar Cedillos U  10/08/2015 11:11 AM  CC: Dr.  Mickie Hillier, MD & Dr. Rayne Du ref. provider found

## 2015-10-08 NOTE — H&P (Signed)
Philip Caldwell is an 49 y.o. male.   Chief Complaint: Patient is here for colonoscopy. HPI: She is 49 year old African-American male was here for screening colonoscopy. He denies abdominal pain change in bowel habits or rectal bleeding. Family history is negative for CRC.  Past Medical History  Diagnosis Date  . A-fib ([place])   . Hypertension     History reviewed. No pertinent past surgical history.  Family History  Problem Relation Age of Onset  . Dementia Mother   . Cancer Father   . Heart attack Brother    Social History:  reports that he has never smoked. He does not have any smokeless tobacco history on file. He reports that he does not drink alcohol or use illicit drugs.  Allergies:  Allergies  Allergen Reactions  . Penicillins Rash    Medications Prior to Admission  Medication Sig Dispense Refill  . SUPREP BOWEL PREP SOLN Take 1 kit by mouth once. 1 Bottle 0  . verapamil (CALAN-SR) 180 MG CR tablet Take 1 tablet (180 mg total) by mouth at bedtime. 30 tablet 6  . benzonatate (TESSALON PERLES) 100 MG capsule Take 1 capsule (100 mg total) by mouth every 6 (six) hours as needed for cough. (Patient not taking: Reported on 08/11/2015) 30 capsule 0  . benzonatate (TESSALON) 100 MG capsule Take 1 capsule (100 mg total) by mouth every 6 (six) hours as needed for cough. (Patient not taking: Reported on 08/11/2015) 24 capsule 0  . SUPREP BOWEL PREP SOLN Take 1 kit by mouth once. 1 Bottle 0    No results found for this or any previous visit (from the past 48 hour(s)). No results found.  ROS  Blood pressure 122/84, pulse 76, temperature 98.5 F (36.9 C), temperature source Oral, resp. rate 12, height 5' 9"  (1.753 m), weight 185 lb (83.915 kg), SpO2 96 %. Physical Exam  Constitutional: He appears well-developed and well-nourished.  HENT:  Mouth/Throat: Oropharynx is clear and moist.  Eyes: Conjunctivae are normal. No scleral icterus.  Neck: No thyromegaly present.  Cardiovascular:  Normal rate, regular rhythm and normal heart sounds.   No murmur heard. Respiratory: Effort normal and breath sounds normal.  GI: Soft. He exhibits no distension and no mass. There is no tenderness.  Musculoskeletal: He exhibits no edema.  Lymphadenopathy:    He has no cervical adenopathy.  Neurological: He is alert.  Skin: Skin is warm and dry.     Assessment/Plan Average risk screening colonoscopy.  Philip Caldwell U 10/08/2015, 10:33 AM

## 2015-10-13 ENCOUNTER — Encounter (HOSPITAL_COMMUNITY): Payer: Self-pay | Admitting: Internal Medicine

## 2016-01-30 ENCOUNTER — Other Ambulatory Visit: Payer: Self-pay | Admitting: Family Medicine

## 2016-02-24 ENCOUNTER — Other Ambulatory Visit: Payer: Self-pay

## 2016-02-24 MED ORDER — VERAPAMIL HCL ER 180 MG PO TBCR: EXTENDED_RELEASE_TABLET | ORAL | Status: DC

## 2016-03-17 ENCOUNTER — Other Ambulatory Visit: Payer: Self-pay

## 2016-03-17 ENCOUNTER — Telehealth: Payer: Self-pay | Admitting: Family Medicine

## 2016-03-17 DIAGNOSIS — Z13220 Encounter for screening for lipoid disorders: Secondary | ICD-10-CM

## 2016-03-17 DIAGNOSIS — I10 Essential (primary) hypertension: Secondary | ICD-10-CM

## 2016-03-17 DIAGNOSIS — Z125 Encounter for screening for malignant neoplasm of prostate: Secondary | ICD-10-CM

## 2016-03-17 DIAGNOSIS — Z139 Encounter for screening, unspecified: Secondary | ICD-10-CM

## 2016-03-17 MED ORDER — VERAPAMIL HCL ER 180 MG PO TBCR: EXTENDED_RELEASE_TABLET | ORAL | Status: DC

## 2016-03-17 NOTE — Telephone Encounter (Signed)
Pt would like BW called in for this appt please

## 2016-03-17 NOTE — Telephone Encounter (Signed)
Discussed with pt. Pt wants to do bw with physical coming up. Orders put in. Pt notified orders are ready.

## 2016-03-17 NOTE — Telephone Encounter (Signed)
Normally we do his b w yrly, wants it sooner, if so repeat, if not, wait ti next six mo visit

## 2016-03-17 NOTE — Telephone Encounter (Signed)
Last labs 08/12/15 lipid, liver, bmp, psa

## 2016-03-17 NOTE — Telephone Encounter (Signed)
Pt is needing refills on his   verapamil (CALAN-SR) 180 MG CR tablet        [facility]

## 2016-03-17 NOTE — Telephone Encounter (Signed)
Spoke with patient and informed him that per protocol we are sending over refill on verapamil. Informed patient that he was due for an office visit. Patient transferred to front desk to schedule an appointment.

## 2016-03-24 ENCOUNTER — Emergency Department (HOSPITAL_COMMUNITY): Payer: 59

## 2016-03-24 ENCOUNTER — Emergency Department (HOSPITAL_COMMUNITY)
Admission: EM | Admit: 2016-03-24 | Discharge: 2016-03-24 | Disposition: A | Discharge status: Home / Self Care | Payer: 59 | Attending: Emergency Medicine | Admitting: Emergency Medicine

## 2016-03-24 ENCOUNTER — Encounter (HOSPITAL_COMMUNITY): Payer: Self-pay | Admitting: *Deleted

## 2016-03-24 DIAGNOSIS — X58XXXA Exposure to other specified factors, initial encounter: Secondary | ICD-10-CM | POA: Insufficient documentation

## 2016-03-24 DIAGNOSIS — I10 Essential (primary) hypertension: Secondary | ICD-10-CM | POA: Diagnosis not present

## 2016-03-24 DIAGNOSIS — S3992XA Unspecified injury of lower back, initial encounter: Secondary | ICD-10-CM | POA: Diagnosis present

## 2016-03-24 DIAGNOSIS — I4891 Unspecified atrial fibrillation: Secondary | ICD-10-CM | POA: Insufficient documentation

## 2016-03-24 DIAGNOSIS — Y9389 Activity, other specified: Secondary | ICD-10-CM | POA: Diagnosis not present

## 2016-03-24 DIAGNOSIS — Y929 Unspecified place or not applicable: Secondary | ICD-10-CM | POA: Diagnosis not present

## 2016-03-24 DIAGNOSIS — Z79899 Other long term (current) drug therapy: Secondary | ICD-10-CM | POA: Diagnosis not present

## 2016-03-24 DIAGNOSIS — S39012A Strain of muscle, fascia and tendon of lower back, initial encounter: Secondary | ICD-10-CM | POA: Diagnosis not present

## 2016-03-24 DIAGNOSIS — Y999 Unspecified external cause status: Secondary | ICD-10-CM | POA: Diagnosis not present

## 2016-03-24 LAB — BASIC METABOLIC PANEL
BUN / CREAT RATIO: 9 (ref 9–20)
BUN: 11 mg/dL (ref 6–24)
CHLORIDE: 98 mmol/L (ref 96–106)
CO2: 25 mmol/L (ref 18–29)
Calcium: 9.4 mg/dL (ref 8.7–10.2)
Creatinine, Ser: 1.29 mg/dL — ABNORMAL HIGH (ref 0.76–1.27)
GFR calc non Af Amer: 64 mL/min/{1.73_m2} (ref >59)
GFR, EST AFRICAN AMERICAN: 74 mL/min/{1.73_m2} (ref >59)
Glucose: 86 mg/dL (ref 65–99)
POTASSIUM: 4.2 mmol/L (ref 3.5–5.2)
Sodium: 139 mmol/L (ref 134–144)

## 2016-03-24 LAB — LIPID PANEL
CHOLESTEROL TOTAL: 192 mg/dL (ref 100–199)
Chol/HDL Ratio: 4.5 ratio units (ref 0.0–5.0)
HDL: 43 mg/dL (ref >39)
LDL Calculated: 105 mg/dL — ABNORMAL HIGH (ref 0–99)
TRIGLYCERIDES: 219 mg/dL — AB (ref 0–149)
VLDL Cholesterol Cal: 44 mg/dL — ABNORMAL HIGH (ref 5–40)

## 2016-03-24 LAB — PSA: Prostate Specific Ag, Serum: 1.4 ng/mL (ref 0.0–4.0)

## 2016-03-24 LAB — HEPATIC FUNCTION PANEL
ALT: 28 IU/L (ref 0–44)
AST: 22 IU/L (ref 0–40)
Albumin: 4.3 g/dL (ref 3.5–5.5)
Alkaline Phosphatase: 83 IU/L (ref 39–117)
BILIRUBIN TOTAL: 0.3 mg/dL (ref 0.0–1.2)
BILIRUBIN, DIRECT: 0.11 mg/dL (ref 0.00–0.40)
Total Protein: 7.2 g/dL (ref 6.0–8.5)

## 2016-03-24 MED ORDER — HYDROCODONE-ACETAMINOPHEN 5-325 MG PO TABS: 1.0000 | ORAL_TABLET | ORAL | Status: DC | PRN

## 2016-03-24 MED ORDER — PREDNISONE 10 MG PO TABS: ORAL_TABLET | ORAL | Status: DC

## 2016-03-24 MED ORDER — PREDNISONE 50 MG PO TABS
60.0000 mg | ORAL_TABLET | Freq: Once | ORAL | Status: AC
  Administered 2016-03-24: 60 mg via ORAL
  Filled 2016-03-24: qty 1

## 2016-03-24 MED ORDER — CYCLOBENZAPRINE HCL 5 MG PO TABS: 5.0000 mg | ORAL_TABLET | Freq: Three times a day (TID) | ORAL | Status: DC | PRN

## 2016-03-24 MED ORDER — HYDROCODONE-ACETAMINOPHEN 5-325 MG PO TABS
1.0000 | ORAL_TABLET | Freq: Once | ORAL | Status: AC
  Administered 2016-03-24: 1 via ORAL
  Filled 2016-03-24: qty 1

## 2016-03-24 NOTE — ED Notes (Signed)
Pt. C/o low back that started Monday. Denies any GI/GU problems. Seen by chiropractor for same.

## 2016-03-24 NOTE — ED Provider Notes (Signed)
CSN: [id number]     Arrival date & time 03/24/16  1317 History   First MD Initiated Contact with Patient 03/24/16 1324     Chief Complaint  Patient presents with  . Back Pain     (Consider location/radiation/quality/duration/timing/severity/associated sxs/prior Treatment) The history is provided by the patient.   Philip Caldwell is a 50 y.o. male with a history of intermittent mild low back pain which is generally relieved with rest after a few days presenting with bilateral lower back pain starting insidiously 2 days ago.  He endorses constant pressure like pain across his lower back which is worsened with certain movements.  He describes stiffness after he has been sitting for a while such as driving home which then improves after walking and stretching.  He denies radiation of pain into his extremities and has no numbness or weakness in his legs, also denies urinary or fecal incontinence or retention.  He took a Flexeril this morning without symptom relief.  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     Past Medical History  Diagnosis Date  . A-fib ([place])   . Hypertension    Past Surgical History  Procedure Laterality Date  . Colonoscopy N/A 10/08/2015    Procedure: COLONOSCOPY;  Surgeon: Rogene Houston, MD;  Location: [facility];  Service: Endoscopy;  Laterality: N/A;  1030   Family History  Problem Relation Age of Onset  . Dementia Mother   . Cancer Father   . Heart attack Brother    Social History  Substance Use Topics  . Smoking status: Never Smoker   . Smokeless tobacco: None  . Alcohol Use: No    Review of Systems  Constitutional: Negative for fever.  Respiratory: Negative for shortness of breath.   Cardiovascular: Negative for chest pain and leg swelling.  Gastrointestinal: Negative for abdominal pain, constipation and abdominal distention.  Genitourinary: Negative for dysuria, urgency, frequency,  flank pain and difficulty urinating.  Musculoskeletal: Positive for back pain. Negative for joint swelling and gait problem.  Skin: Negative for rash.  Neurological: Negative for weakness and numbness.      Allergies  Penicillins  Home Medications   Prior to Admission medications   Medication Sig Start Date End Date Taking? Authorizing Provider  verapamil (CALAN-SR) 180 MG CR tablet TAKE 1 TABLET (180 MG TOTAL) BY MOUTH AT BEDTIME. 03/17/16  Yes [name], MD  cyclobenzaprine (FLEXERIL) 5 MG tablet Take 1 tablet (5 mg total) by mouth 3 (three) times daily as needed for muscle spasms. 03/24/16   Evalee Jefferson, PA-C  HYDROcodone-acetaminophen (NORCO/VICODIN) 5-325 MG tablet Take 1 tablet by mouth every 4 (four) hours as needed. 03/24/16   Evalee Jefferson, PA-C  predniSONE (DELTASONE) 10 MG tablet 6, 5, 4, 3, 2 then 1 tablet by mouth daily for 6 days total. 03/24/16   Evalee Jefferson, PA-C   BP 155/84 mmHg  Pulse 63  Temp(Src) 98 F (36.7 C) (Oral)  Resp 17  Ht 5\' 9"  (1.753 m)  Wt 86.183 kg  BMI 28.05 kg/m2  SpO2 98% Physical Exam  Constitutional: He appears well-developed and well-nourished.  HENT:  Head: Normocephalic.  Eyes: Conjunctivae are normal.  Neck: Normal range of motion. Neck supple.  Cardiovascular: Normal rate and intact distal pulses.   Pedal pulses normal.  Pulmonary/Chest: Effort normal.  Abdominal: Soft. Bowel sounds are normal. He exhibits no distension and no mass.  Musculoskeletal: Normal range  of motion. He exhibits no edema.       Lumbar back: He exhibits tenderness. He exhibits no swelling, no edema and no spasm.  ttp bilateral low lumbar soft tissue.  No midline pain.  Neurological: He is alert. He has normal strength. He displays no atrophy and no tremor. No sensory deficit. Gait normal.  Reflex Scores:      Patellar reflexes are 2+ on the right side and 2+ on the left side.      Achilles reflexes are 2+ on the right side and 2+ on the left side. No strength  deficit noted in hip and knee flexor and extensor muscle groups.  Ankle flexion and extension intact.  Skin: Skin is warm and dry.  Psychiatric: He has a normal mood and affect.  Nursing note and vitals reviewed.   ED Course  Procedures (including critical care time) Labs Review Labs Reviewed - No data to display  Imaging Review Dg Lumbar Spine Complete  03/24/2016  CLINICAL DATA:  50 year old male with a history of lumbar back pain for 2 days. No injury EXAM: LUMBAR SPINE - COMPLETE 4+ VIEW COMPARISON:  None. FINDINGS: Lumbar Spine: Lumbar vertebral elements maintain normal alignment without evidence of anterolisthesis, retrolisthesis, subluxation. No fracture line identified. Vertebral body heights maintained as well as disc space heights. No significant degenerative disc disease or endplate changes. No significant facet changes. Unremarkable appearance of the visualized abdomen. Oblique images demonstrate no displaced pars defect. IMPRESSION: Negative for acute fracture or malalignment of the lumbar spine. Signed, Dulcy Fanny. Earleen Newport, DO [facility] Electronically Signed   By: Corrie Mckusick D.O.   On: 03/24/2016 14:10   I have personally reviewed and evaluated these images and lab results as part of my medical decision-making.   EKG Interpretation None      MDM   Final diagnoses:  Lumbar strain, initial encounter     Radiological studies were viewed, interpreted and considered during the medical decision making and disposition process. I agree with radiologists reading.  Results were also discussed with patient. Pt prescribed additional flexeril, hydrocodone, prednisone.  Advised activity as tolerated,  Recheck by pcp if not improved over 5 days.    No neuro deficit on exam or by history to suggest emergent or surgical presentation.  Discussed worsened sx that should prompt immediate re-evaluation including distal weakness,  bowel/bladder retention/incontinence.           Evalee Jefferson, PA-C 03/24/16 [place], MD 03/27/16 [phone]

## 2016-03-24 NOTE — Discharge Instructions (Signed)
Lumbosacral Strain Lumbosacral strain is a strain of any of the parts that make up your lumbosacral vertebrae. Your lumbosacral vertebrae are the bones that make up the lower third of your backbone. Your lumbosacral vertebrae are held together by muscles and tough, fibrous tissue (ligaments).  CAUSES  A sudden blow to your back can cause lumbosacral strain. Also, anything that causes an excessive stretch of the muscles in the low back can cause this strain. This is typically seen when people exert themselves strenuously, fall, lift heavy objects, bend, or crouch repeatedly. RISK FACTORS  Physically demanding work.  Participation in pushing or pulling sports or sports that require a sudden twist of the back (tennis, golf, baseball).  Weight lifting.  Excessive lower back curvature.  Forward-tilted pelvis.  Weak back or abdominal muscles or both.  Tight hamstrings. SIGNS AND SYMPTOMS  Lumbosacral strain may cause pain in the area of your injury or pain that moves (radiates) down your leg.  DIAGNOSIS Your health care provider can often diagnose lumbosacral strain through a physical exam. In some cases, you may need tests such as X-ray exams.  TREATMENT  Treatment for your lower back injury depends on many factors that your clinician will have to evaluate. However, most treatment will include the use of anti-inflammatory medicines. HOME CARE INSTRUCTIONS   Avoid hard physical activities (tennis, racquetball, waterskiing) if you are not in proper physical condition for it. This may aggravate or create problems.  If you have a back problem, avoid sports requiring sudden body movements. Swimming and walking are generally safer activities.  Maintain good posture.  Maintain a healthy weight.  For acute conditions, you may put ice on the injured area.  Put ice in a plastic bag.  Place a towel between your skin and the bag.  Leave the ice on for 20 minutes, 2-3 times a day.  When the  low back starts healing, stretching and strengthening exercises may be recommended. SEEK MEDICAL CARE IF:  Your back pain is getting worse.  You experience severe back pain not relieved with medicines. SEEK IMMEDIATE MEDICAL CARE IF:   You have numbness, tingling, weakness, or problems with the use of your arms or legs.  There is a change in bowel or bladder control.  You have increasing pain in any area of the body, including your belly (abdomen).  You notice shortness of breath, dizziness, or feel faint.  You feel sick to your stomach (nauseous), are throwing up (vomiting), or become sweaty.  You notice discoloration of your toes or legs, or your feet get very cold. MAKE SURE YOU:   Understand these instructions.  Will watch your condition.  Will get help right away if you are not doing well or get worse.   This information is not intended to replace advice given to you by your health care provider. Make sure you discuss any questions you have with your health care provider.   Document Released: 08/25/2005 Document Revised: 12/06/2014 Document Reviewed: 07/04/2013 Elsevier Interactive Patient Education 2016 [place] your next dose of prednisone tomorrow morning.  Use the the other medicines as directed.  Do not drive within 4 hours of taking hydrocodone as this will make you drowsy.  Avoid lifting,  Bending,  Twisting or any other activity that worsens your pain over the next week.  Apply an  icepack  to your lower back for 10-15 minutes every 2 hours for the next 2 days.  You should get rechecked if  your symptoms are not better over the next 5 days,  Or you develop increased pain,  Weakness in your leg(s) or loss of bladder or bowel function - these are symptoms of a worse injury.

## 2016-03-29 ENCOUNTER — Other Ambulatory Visit: Payer: Self-pay | Admitting: Family Medicine

## 2016-04-01 ENCOUNTER — Encounter: Payer: Self-pay | Admitting: Family Medicine

## 2016-04-01 ENCOUNTER — Ambulatory Visit (INDEPENDENT_AMBULATORY_CARE_PROVIDER_SITE_OTHER): Payer: 59 | Admitting: Family Medicine

## 2016-04-01 VITALS — BP 138/86 | Ht 69.0 in | Wt 202.4 lb

## 2016-04-01 DIAGNOSIS — Z0000 Encounter for general adult medical examination without abnormal findings: Secondary | ICD-10-CM | POA: Diagnosis not present

## 2016-04-01 MED ORDER — VERAPAMIL HCL ER 180 MG PO TBCR: EXTENDED_RELEASE_TABLET | ORAL | Status: DC

## 2016-04-01 NOTE — Patient Instructions (Signed)
Results for orders placed or performed in visit on 03/17/16  Lipid panel  Result Value Ref Range   Cholesterol, Total 192 100 - 199 mg/dL   Triglycerides 219 (H) 0 - 149 mg/dL   HDL 43 >39 mg/dL   VLDL Cholesterol Cal 44 (H) 5 - 40 mg/dL   LDL Calculated 105 (H) 0 - 99 mg/dL   Chol/HDL Ratio 4.5 0.0 - 5.0 ratio units  Hepatic function panel  Result Value Ref Range   Total Protein 7.2 6.0 - 8.5 g/dL   Albumin 4.3 3.5 - 5.5 g/dL   Bilirubin Total 0.3 0.0 - 1.2 mg/dL   Bilirubin, Direct 0.11 0.00 - 0.40 mg/dL   Alkaline Phosphatase 83 39 - 117 IU/L   AST 22 0 - 40 IU/L   ALT 28 0 - 44 IU/L  Basic metabolic panel  Result Value Ref Range   Glucose 86 65 - 99 mg/dL   BUN 11 6 - 24 mg/dL   Creatinine, Ser 1.29 (H) 0.76 - 1.27 mg/dL   GFR calc non Af Amer 64 >59 mL/min/1.73   GFR calc Af Amer 74 >59 mL/min/1.73   BUN/Creatinine Ratio 9 9 - 20   Sodium 139 134 - 144 mmol/L   Potassium 4.2 3.5 - 5.2 mmol/L   Chloride 98 96 - 106 mmol/L   CO2 25 18 - 29 mmol/L   Calcium 9.4 8.7 - 10.2 mg/dL  PSA  Result Value Ref Range   Prostate Specific Ag, Serum 1.4 0.0 - 4.0 ng/mL

## 2016-04-01 NOTE — Progress Notes (Signed)
Subjective:    Patient ID: Philip Caldwell, male    DOB: 1966/05/05, 50 y.o.   MRN: [id number]  HPI The patient comes in today for a wellness visit.  Colon done, given seven yre interval Pt had fairly severe low back pain, did xrays, normal per pt, pain has gone away, finished taking the meds A review of their health history was completed.  A review of medications was also completed.  not exercising reg at these days   Any needed refills; yes  Eating habits: could be better  Falls/  MVA accidents in past few months: none  Regular exercise: not really  Specialist pt sees on regular basis: no  Preventative health issues were discussed.   Pt calle d [employer] and statee since this  Additional concerns: none  Results for orders placed or performed in visit on 03/17/16  Lipid panel  Result Value Ref Range   Cholesterol, Total 192 100 - 199 mg/dL   Triglycerides 219 (H) 0 - 149 mg/dL   HDL 43 >39 mg/dL   VLDL Cholesterol Cal 44 (H) 5 - 40 mg/dL   LDL Calculated 105 (H) 0 - 99 mg/dL   Chol/HDL Ratio 4.5 0.0 - 5.0 ratio units  Hepatic function panel  Result Value Ref Range   Total Protein 7.2 6.0 - 8.5 g/dL   Albumin 4.3 3.5 - 5.5 g/dL   Bilirubin Total 0.3 0.0 - 1.2 mg/dL   Bilirubin, Direct 0.11 0.00 - 0.40 mg/dL   Alkaline Phosphatase 83 39 - 117 IU/L   AST 22 0 - 40 IU/L   ALT 28 0 - 44 IU/L  Basic metabolic panel  Result Value Ref Range   Glucose 86 65 - 99 mg/dL   BUN 11 6 - 24 mg/dL   Creatinine, Ser 1.29 (H) 0.76 - 1.27 mg/dL   GFR calc non Af Amer 64 >59 mL/min/1.73   GFR calc Af Amer 74 >59 mL/min/1.73   BUN/Creatinine Ratio 9 9 - 20   Sodium 139 134 - 144 mmol/L   Potassium 4.2 3.5 - 5.2 mmol/L   Chloride 98 96 - 106 mmol/L   CO2 25 18 - 29 mmol/L   Calcium 9.4 8.7 - 10.2 mg/dL  PSA  Result Value Ref Range   Prostate Specific Ag, Serum 1.4 0.0 - 4.0 ng/mL     Review of Systems  Constitutional: Negative for fever, activity change and appetite change.    HENT: Negative for congestion and rhinorrhea.   Eyes: Negative for discharge.  Respiratory: Negative for cough and wheezing.   Cardiovascular: Negative for chest pain.  Gastrointestinal: Negative for vomiting, abdominal pain and blood in stool.  Genitourinary: Negative for frequency and difficulty urinating.  Musculoskeletal: Negative for neck pain.  Skin: Negative for rash.  Allergic/Immunologic: Negative for environmental allergies and food allergies.  Neurological: Negative for weakness and headaches.  Psychiatric/Behavioral: Negative for agitation.  All other systems reviewed and are negative.      Objective:   Physical Exam  Constitutional: He appears well-developed and well-nourished.  HENT:  Head: Normocephalic and atraumatic.  Right Ear: External ear normal.  Left Ear: External ear normal.  Nose: Nose normal.  Mouth/Throat: Oropharynx is clear and moist.  Eyes: EOM are normal. Pupils are equal, round, and reactive to light.  Neck: Normal range of motion. Neck supple. No thyromegaly present.  Cardiovascular: Normal rate, regular rhythm and normal heart sounds.   No murmur heard. Pulmonary/Chest: Effort normal and breath sounds normal. No  respiratory distress. He has no wheezes.  Abdominal: Soft. Bowel sounds are normal. He exhibits no distension and no mass. There is no tenderness.  Genitourinary: Penis normal.  Musculoskeletal: Normal range of motion. He exhibits no edema.  Lymphadenopathy:    He has no cervical adenopathy.  Neurological: He is alert. He exhibits normal muscle tone.  Skin: Skin is warm and dry. No erythema.  Psychiatric: He has a normal mood and affect. His behavior is normal. Judgment normal.  Vitals reviewed.  prostate within normal limits        Assessment & Plan:  Impression well adult exam, of note patient had a wellness exam only 7 months ago, he states he called [place] [facility] and was told he can get another one now #2 hypertension  good control #3 overweight discussed plan exercise diet discussed. Up-to-date on colonoscopy. Blood work reviewed. Medications refilled. Recheck in 6 months, ongoing into the future [facility]

## 2016-10-04 ENCOUNTER — Ambulatory Visit: Payer: 59

## 2016-10-12 ENCOUNTER — Other Ambulatory Visit: Payer: Self-pay | Admitting: Family Medicine

## 2016-10-13 ENCOUNTER — Other Ambulatory Visit: Payer: Self-pay | Admitting: Family Medicine

## 2016-11-10 ENCOUNTER — Other Ambulatory Visit: Payer: Self-pay | Admitting: *Deleted

## 2016-11-10 MED ORDER — VERAPAMIL HCL ER 180 MG PO TBCR: EXTENDED_RELEASE_TABLET | ORAL | 0 refills | Status: DC

## 2016-11-12 ENCOUNTER — Other Ambulatory Visit: Payer: Self-pay | Admitting: Family Medicine

## 2016-12-01 ENCOUNTER — Ambulatory Visit (INDEPENDENT_AMBULATORY_CARE_PROVIDER_SITE_OTHER): Payer: 59 | Admitting: Family Medicine

## 2016-12-01 ENCOUNTER — Encounter: Payer: Self-pay | Admitting: Family Medicine

## 2016-12-01 VITALS — BP 132/90 | Temp 99.5°F | Ht 69.0 in | Wt 205.0 lb

## 2016-12-01 DIAGNOSIS — J189 Pneumonia, unspecified organism: Secondary | ICD-10-CM

## 2016-12-01 DIAGNOSIS — J181 Lobar pneumonia, unspecified organism: Secondary | ICD-10-CM | POA: Diagnosis not present

## 2016-12-01 DIAGNOSIS — R509 Fever, unspecified: Secondary | ICD-10-CM

## 2016-12-01 MED ORDER — ONDANSETRON 4 MG PO TBDP: 4.0000 mg | ORAL_TABLET | Freq: Three times a day (TID) | ORAL | 0 refills | Status: DC | PRN

## 2016-12-01 MED ORDER — DOXYCYCLINE HYCLATE 100 MG PO TABS: 100.0000 mg | ORAL_TABLET | Freq: Two times a day (BID) | ORAL | 0 refills | Status: DC

## 2016-12-01 MED ORDER — CEFTRIAXONE SODIUM 1 G IJ SOLR
1.0000 g | Freq: Once | INTRAMUSCULAR | Status: AC
  Administered 2016-12-01: 1 g via INTRAMUSCULAR

## 2016-12-01 NOTE — Progress Notes (Signed)
   Subjective:    Patient ID: Philip Caldwell, male    DOB: 1966/10/06, 51 y.o.   MRN: [id number]  Sinusitis  This is a new problem. Episode onset: 3 days. Associated symptoms include congestion, coughing and headaches. (Fever, wheezing, diarrhea, abd pain)   Patient had about a one-week prodrome cough congestion drainage. Slowly diminishing energy. Cold and cough for a week or so Next  2 days ago had a fairly quick turn for the worse. Substantial fevers. Chills. Right-sided pleuritic chest pain worse with deep breath worse with cough. Productive green phlegm diminished energy diminished appetite no vomiting mild headache   mon night fever  Right sided chest pain, sharp in nature  Dm guaf for cough  Taking tob prn    Review of Systems  HENT: Positive for congestion.   Respiratory: Positive for cough.   Neurological: Positive for headaches.       Objective:   Physical Exam  Alert moderate malaise. Vitals stable lungs distinct right basilar crackles. No wheezes no tick tachypnea heart rare rhythm abdomen benign      Assessment & Plan:  Impression right lobar pneumonia long discussion held. Patient on calcium channel blockers. We should avoid macrolides. However with community-acquired need to cover for both atypical and straightforward agents. Due to relatively substantial sickness and malaise and need for turning him around or [place] up in the emergency room or hospitalized. With an injection. Then oral doxycycline. Symptom care discussed warning signs discussed carefully easily 25 minutes spent most in discussion of the patient's pneumonia which concerned particularly his wife during discussion

## 2016-12-14 ENCOUNTER — Emergency Department (HOSPITAL_COMMUNITY)
Admission: EM | Admit: 2016-12-14 | Discharge: 2016-12-14 | Disposition: A | Discharge status: Home / Self Care | Payer: 59 | Attending: Emergency Medicine | Admitting: Emergency Medicine

## 2016-12-14 ENCOUNTER — Emergency Department (HOSPITAL_COMMUNITY): Payer: 59

## 2016-12-14 ENCOUNTER — Encounter (HOSPITAL_COMMUNITY): Payer: Self-pay | Admitting: Emergency Medicine

## 2016-12-14 DIAGNOSIS — I10 Essential (primary) hypertension: Secondary | ICD-10-CM | POA: Diagnosis not present

## 2016-12-14 DIAGNOSIS — Z791 Long term (current) use of non-steroidal anti-inflammatories (NSAID): Secondary | ICD-10-CM | POA: Insufficient documentation

## 2016-12-14 DIAGNOSIS — M7989 Other specified soft tissue disorders: Secondary | ICD-10-CM | POA: Diagnosis not present

## 2016-12-14 DIAGNOSIS — M79671 Pain in right foot: Secondary | ICD-10-CM | POA: Insufficient documentation

## 2016-12-14 DIAGNOSIS — M25571 Pain in right ankle and joints of right foot: Secondary | ICD-10-CM | POA: Diagnosis not present

## 2016-12-14 MED ORDER — NAPROXEN 500 MG PO TABS: 500.0000 mg | ORAL_TABLET | Freq: Two times a day (BID) | ORAL | 1 refills | Status: DC

## 2016-12-14 NOTE — Discharge Instructions (Signed)
Recommending that you follow-up with podiatry. There is podiatrist here in [place] area. Dr. Caprice Beaver has an office in [place]. Phone number is [phone]. Take the Naprosyn on a regular basis. X-rays were negative.

## 2016-12-14 NOTE — ED Provider Notes (Signed)
[facility] DEPT Provider Note   CSN: [id number] Arrival date & time: 12/14/16  0720     History   Chief Complaint Chief Complaint  Patient presents with  . Foot Pain    HPI Philip Caldwell is a 51 y.o. male.  The patient with complaint of right foot pain lateral aspect of the foot for one week. No known injury. No prior history of any similar problem. Patient states her swelling to the lateral aspect of the right foot. No other complaints. Patient works for [employer] a lot of time on his feet.      Past Medical History:  Diagnosis Date  . A-fib ([place])   . Hypertension     Patient Active Problem List   Diagnosis Date Noted  . Essential hypertension 10/14/2014  . Chronic arthralgias of knees and hips 10/14/2014  . SHOULDER PAIN 07/15/2008  . IMPINGEMENT SYNDROME 07/15/2008  . [place] HEAD 07/15/2008    Past Surgical History:  Procedure Laterality Date  . COLONOSCOPY N/A 10/08/2015   Procedure: COLONOSCOPY;  Surgeon: Rogene Houston, MD;  Location: [facility];  Service: Endoscopy;  Laterality: N/A;  1030       Home Medications    Prior to Admission medications   Medication Sig Start Date End Date Taking? Authorizing Provider  ibuprofen (ADVIL,MOTRIN) 200 MG tablet Take 200-400 mg by mouth as needed for moderate pain.   Yes Historical Provider, MD  doxycycline (VIBRA-TABS) 100 MG tablet Take 1 tablet (100 mg total) by mouth 2 (two) times daily. Patient not taking: Reported on 12/14/2016 12/01/16   [name], MD  naproxen (NAPROSYN) 500 MG tablet Take 1 tablet (500 mg total) by mouth 2 (two) times daily. 12/14/16   Fredia Sorrow, MD  ondansetron (ZOFRAN ODT) 4 MG disintegrating tablet Take 1 tablet (4 mg total) by mouth every 8 (eight) hours as needed for nausea or vomiting. Patient not taking: Reported on 12/14/2016 12/01/16   [name], MD  verapamil (CALAN-SR) 180 MG CR tablet TAKE 1 TABLET (180 MG TOTAL) BY MOUTH AT BEDTIME. 10/12/16    [name], MD    Family History Family History  Problem Relation Age of Onset  . Dementia Mother   . Cancer Father   . Heart attack Brother     Social History Social History  Substance Use Topics  . Smoking status: Never Smoker  . Smokeless tobacco: Never Used  . Alcohol use No     Allergies   Penicillins   Review of Systems Review of Systems  Constitutional: Negative for fever.  HENT: Negative for congestion.   Eyes: Negative for redness.  Respiratory: Negative for shortness of breath.   Cardiovascular: Negative for chest pain.  Gastrointestinal: Negative for abdominal pain.  Genitourinary: Negative for hematuria.  Musculoskeletal: Positive for joint swelling. Negative for back pain.  Neurological: Negative for headaches.  Hematological: Does not bruise/bleed easily.  Psychiatric/Behavioral: Negative for confusion.     Physical Exam Updated Vital Signs BP 160/98 (BP Location: Left Arm)   Pulse 73   Temp 97.8 F (36.6 C) (Oral)   Resp 18   Ht 5\' 9"  (1.753 m)   Wt 93 kg   SpO2 96%   BMI 30.27 kg/m   Physical Exam  Constitutional: He is oriented to person, place, and time. He appears well-developed and well-nourished. No distress.  HENT:  Head: Normocephalic and atraumatic.  Eyes: EOM are normal. Pupils are equal, round, and reactive to light.  Neck: Normal range of motion. Neck supple.  Cardiovascular: Normal rate, regular rhythm and normal heart sounds.   Pulmonary/Chest: Effort normal and breath sounds normal. No respiratory distress.  Abdominal: Soft. Bowel sounds are normal. There is no tenderness.  Musculoskeletal: Normal range of motion. He exhibits edema.  Right foot laterally and proximal area about 5 cm increased warmth and swelling. Cap refill is normal. No proximal leg tenderness. No swelling to the medial aspect of the foot.  Neurological: He is alert and oriented to person, place, and time. No cranial nerve deficit or sensory  deficit. He exhibits normal muscle tone. Coordination normal.  Skin: Skin is warm. There is erythema.  Nursing note and vitals reviewed.    ED Treatments / Results  Labs (all labs ordered are listed, but only abnormal results are displayed) Labs Reviewed - No data to display  EKG  EKG Interpretation None       Radiology Dg Ankle Complete Right  Result Date: 12/14/2016 CLINICAL DATA:  Pain EXAM: RIGHT ANKLE - COMPLETE 3+ VIEW COMPARISON:  None. FINDINGS: Frontal, oblique, and lateral views were obtained. There is no appreciable fracture or joint effusion. Ankle mortise appears intact. No appreciable joint space narrowing or erosion. IMPRESSION: No fracture or evident arthropathy.  Ankle mortise appears intact. Electronically Signed   By: Lowella Grip III M.D.   On: 12/14/2016 08:49   Dg Foot Complete Right  Result Date: 12/14/2016 CLINICAL DATA:  Pain for 1 week EXAM: RIGHT FOOT COMPLETE - 3+ VIEW COMPARISON:  None. FINDINGS: Frontal, oblique, and lateral views were obtained. There is no fracture or dislocation. The joint spaces appear normal. No erosive change. IMPRESSION: No fracture or dislocation.  No evident arthropathy. Electronically Signed   By: Lowella Grip III M.D.   On: 12/14/2016 08:48    Procedures Procedures (including critical care time)  Medications Ordered in ED Medications - No data to display   Initial Impression / Assessment and Plan / ED Course  I have reviewed the triage vital signs and the nursing notes.  Pertinent labs & imaging results that were available during my care of the patient were reviewed by me and considered in my medical decision making (see chart for details).  Clinical Course    X-rays of the right foot and ankle without any acute findings. Patient with proximal lateral foot increased warmth and swelling suggestive of an inflammatory arthritis. Could possibly be gout. But no bony findings. Will treat with Naprosyn and follow-up  with podiatry.     Final Clinical Impressions(s) / ED Diagnoses   Final diagnoses:  Foot pain, right    New Prescriptions New Prescriptions   NAPROXEN (NAPROSYN) 500 MG TABLET    Take 1 tablet (500 mg total) by mouth 2 (two) times daily.     Fredia Sorrow, MD 12/14/16 [phone]

## 2016-12-14 NOTE — ED Triage Notes (Signed)
Patient complains of right foot pain that started 1 weeks ago.

## 2016-12-28 ENCOUNTER — Other Ambulatory Visit: Payer: Self-pay | Admitting: Family Medicine

## 2017-01-29 ENCOUNTER — Other Ambulatory Visit: Payer: Self-pay | Admitting: Family Medicine

## 2017-02-16 ENCOUNTER — Ambulatory Visit (INDEPENDENT_AMBULATORY_CARE_PROVIDER_SITE_OTHER): Payer: 59 | Admitting: Family Medicine

## 2017-02-16 ENCOUNTER — Encounter: Payer: Self-pay | Admitting: Family Medicine

## 2017-02-16 VITALS — BP 140/92 | Ht 69.0 in | Wt 205.0 lb

## 2017-02-16 DIAGNOSIS — Z125 Encounter for screening for malignant neoplasm of prostate: Secondary | ICD-10-CM | POA: Diagnosis not present

## 2017-02-16 DIAGNOSIS — I10 Essential (primary) hypertension: Secondary | ICD-10-CM | POA: Diagnosis not present

## 2017-02-16 DIAGNOSIS — Z13220 Encounter for screening for lipoid disorders: Secondary | ICD-10-CM

## 2017-02-16 MED ORDER — VERAPAMIL HCL ER 240 MG PO TBCR: 240.0000 mg | EXTENDED_RELEASE_TABLET | Freq: Every day | ORAL | 5 refills | Status: DC

## 2017-02-16 NOTE — Progress Notes (Signed)
   Subjective:    Patient ID: Philip Caldwell, male    DOB: 12-Sep-1966, 51 y.o.   MRN: [id number]  Hypertension  This is a chronic problem. Associated symptoms include headaches. Treatments tried: verapamil 180 mg.  BP readings ( 170's over 90's) high for the past month.   Compliant with bp meds takes regulalry  No exercise at all   Mostly taking b p meds  Pt has own cuff Top numb high o140s  Bottom 90s    Blood pressure medicine and blood pressure levels reviewed today with patient. Compliant with blood pressure medicine. States does not miss a dose. No obvious side effects. Blood pressure generally elevated when checked elsewhere. Watching salt intake.   Review of Systems  Neurological: Positive for headaches.  No vomiting no loss of consciousness     Objective:   Physical Exam Blood pressure 142/92 both arms  HEENT normal lungs clear. Heart regular in rhythm.       Assessment & Plan:  Impression hypertension suboptimal in control discussed plan increase verapamil 240 mg daily compliance discussed diet exercise discussed recheck in several months for wellness plus blood pressure evaluation

## 2017-04-08 DIAGNOSIS — Z13220 Encounter for screening for lipoid disorders: Secondary | ICD-10-CM | POA: Diagnosis not present

## 2017-04-08 DIAGNOSIS — Z0000 Encounter for general adult medical examination without abnormal findings: Secondary | ICD-10-CM | POA: Diagnosis not present

## 2017-04-08 DIAGNOSIS — I10 Essential (primary) hypertension: Secondary | ICD-10-CM | POA: Diagnosis not present

## 2017-04-09 LAB — LIPID PANEL
CHOL/HDL RATIO: 3.9 ratio (ref 0.0–5.0)
Cholesterol, Total: 208 mg/dL — ABNORMAL HIGH (ref 100–199)
HDL: 54 mg/dL (ref >39)
LDL CALC: 139 mg/dL — AB (ref 0–99)
Triglycerides: 75 mg/dL (ref 0–149)
VLDL Cholesterol Cal: 15 mg/dL (ref 5–40)

## 2017-04-09 LAB — BASIC METABOLIC PANEL
BUN / CREAT RATIO: 13 (ref 9–20)
BUN: 18 mg/dL (ref 6–24)
CALCIUM: 9.6 mg/dL (ref 8.7–10.2)
CHLORIDE: 103 mmol/L (ref 96–106)
CO2: 25 mmol/L (ref 18–29)
CREATININE: 1.37 mg/dL — AB (ref 0.76–1.27)
GFR calc non Af Amer: 59 mL/min/{1.73_m2} — ABNORMAL LOW (ref >59)
GFR, EST AFRICAN AMERICAN: 68 mL/min/{1.73_m2} (ref >59)
GLUCOSE: 74 mg/dL (ref 65–99)
Potassium: 4.9 mmol/L (ref 3.5–5.2)
Sodium: 141 mmol/L (ref 134–144)

## 2017-04-09 LAB — HEPATIC FUNCTION PANEL
ALT: 21 IU/L (ref 0–44)
AST: 17 IU/L (ref 0–40)
Albumin: 4.4 g/dL (ref 3.5–5.5)
Alkaline Phosphatase: 85 IU/L (ref 39–117)
BILIRUBIN TOTAL: 0.5 mg/dL (ref 0.0–1.2)
BILIRUBIN, DIRECT: 0.15 mg/dL (ref 0.00–0.40)
TOTAL PROTEIN: 7.1 g/dL (ref 6.0–8.5)

## 2017-04-09 LAB — PSA: PROSTATE SPECIFIC AG, SERUM: 1.8 ng/mL (ref 0.0–4.0)

## 2017-04-11 ENCOUNTER — Telehealth: Payer: Self-pay | Admitting: *Deleted

## 2017-04-11 NOTE — Telephone Encounter (Signed)
Results of bloodwork

## 2017-04-18 ENCOUNTER — Encounter: Payer: Self-pay | Admitting: Family Medicine

## 2017-04-18 ENCOUNTER — Ambulatory Visit (INDEPENDENT_AMBULATORY_CARE_PROVIDER_SITE_OTHER): Payer: 59 | Admitting: Family Medicine

## 2017-04-18 VITALS — BP 120/80 | Ht 69.0 in | Wt 196.1 lb

## 2017-04-18 DIAGNOSIS — Z0000 Encounter for general adult medical examination without abnormal findings: Secondary | ICD-10-CM | POA: Diagnosis not present

## 2017-04-18 MED ORDER — VERAPAMIL HCL ER 240 MG PO TBCR: 240.0000 mg | EXTENDED_RELEASE_TABLET | Freq: Every day | ORAL | 5 refills | Status: DC

## 2017-04-18 NOTE — Progress Notes (Signed)
Subjective:    Patient ID: Philip Caldwell, male    DOB: [birth year], 51 y.o.   MRN: [id number]  HPI The patient comes in today for a wellness visit.    A review of their health history was completed.  A review of medications was also completed.  Any needed refills; Yes   Eating habits: States eating habits are fair.   Falls/  MVA accidents in past few months: None   Regular exercise: Patient states does not exercise  Specialist pt sees on regular basis: States does not see any specialist.   Preventative health issues were discussed.   Additional concerns: State no other concerns this visit.   Blood pressure medicine and blood pressure levels reviewed today with patient. Compliant with blood pressure medicine. States does not miss a dose. No obvious side effects. Blood pressure generally good when checked elsewhere. Watching salt intake.    130s over mostly high 80s  Results for orders placed or performed in visit on 02/16/17  Lipid panel  Result Value Ref Range   Cholesterol, Total 208 (H) 100 - 199 mg/dL   Triglycerides 75 0 - 149 mg/dL   HDL 54 >39 mg/dL   VLDL Cholesterol Cal 15 5 - 40 mg/dL   LDL Calculated 139 (H) 0 - 99 mg/dL   Chol/HDL Ratio 3.9 0.0 - 5.0 ratio  Hepatic function panel  Result Value Ref Range   Total Protein 7.1 6.0 - 8.5 g/dL   Albumin 4.4 3.5 - 5.5 g/dL   Bilirubin Total 0.5 0.0 - 1.2 mg/dL   Bilirubin, Direct 0.15 0.00 - 0.40 mg/dL   Alkaline Phosphatase 85 39 - 117 IU/L   AST 17 0 - 40 IU/L   ALT 21 0 - 44 IU/L  Basic metabolic panel  Result Value Ref Range   Glucose 74 65 - 99 mg/dL   BUN 18 6 - 24 mg/dL   Creatinine, Ser 1.37 (H) 0.76 - 1.27 mg/dL   GFR calc non Af Amer 59 (L) >59 mL/min/1.73   GFR calc Af Amer 68 >59 mL/min/1.73   BUN/Creatinine Ratio 13 9 - 20   Sodium 141 134 - 144 mmol/L   Potassium 4.9 3.5 - 5.2 mmol/L   Chloride 103 96 - 106 mmol/L   CO2 25 18 - 29 mmol/L   Calcium 9.6 8.7 - 10.2 mg/dL  PSA  Result  Value Ref Range   Prostate Specific Ag, Serum 1.8 0.0 - 4.0 ng/mL   Lot of walking t work   Review of Systems  Constitutional: Negative for activity change, appetite change and fever.  HENT: Negative for congestion and rhinorrhea.   Eyes: Negative for discharge.  Respiratory: Negative for cough and wheezing.   Cardiovascular: Negative for chest pain.  Gastrointestinal: Negative for abdominal pain, blood in stool and vomiting.  Genitourinary: Negative for difficulty urinating and frequency.  Musculoskeletal: Negative for neck pain.  Skin: Negative for rash.  Allergic/Immunologic: Negative for environmental allergies and food allergies.  Neurological: Negative for weakness and headaches.  Psychiatric/Behavioral: Negative for agitation.  All other systems reviewed and are negative.      Objective:   Physical Exam  Constitutional: He appears well-developed and well-nourished.  HENT:  Head: Normocephalic and atraumatic.  Right Ear: External ear normal.  Left Ear: External ear normal.  Nose: Nose normal.  Mouth/Throat: Oropharynx is clear and moist.  Eyes: EOM are normal. Pupils are equal, round, and reactive to light.  Neck: Normal range of motion. Neck  supple. No thyromegaly present.  Cardiovascular: Normal rate, regular rhythm and normal heart sounds.   No murmur heard. Pulmonary/Chest: Effort normal and breath sounds normal. No respiratory distress. He has no wheezes.  Abdominal: Soft. Bowel sounds are normal. He exhibits no distension and no mass. There is no tenderness.  Genitourinary: Penis normal.  Musculoskeletal: Normal range of motion. He exhibits no edema.  Lymphadenopathy:    He has no cervical adenopathy.  Neurological: He is alert. He exhibits normal muscle tone.  Skin: Skin is warm and dry. No erythema.  Psychiatric: He has a normal mood and affect. His behavior is normal. Judgment normal.  Vitals reviewed.         Assessment & Plan:  Impression 1 well  adult exam. Up-to-date on colonoscopy. Not exercising currently. Trying to watch diet. #2 hypertension compliant with meds no obvious side effects plan diet exercise discussed blood work discussed follow-up in 6 months [facility]

## 2017-05-11 ENCOUNTER — Other Ambulatory Visit: Payer: Self-pay

## 2017-05-11 MED ORDER — VERAPAMIL HCL ER 240 MG PO TBCR: 240.0000 mg | EXTENDED_RELEASE_TABLET | Freq: Every day | ORAL | 1 refills | Status: DC

## 2017-09-29 ENCOUNTER — Telehealth: Payer: Self-pay | Admitting: Family Medicine

## 2017-09-29 ENCOUNTER — Encounter (HOSPITAL_COMMUNITY): Payer: Self-pay | Admitting: Emergency Medicine

## 2017-09-29 ENCOUNTER — Emergency Department (HOSPITAL_COMMUNITY)
Admission: EM | Admit: 2017-09-29 | Discharge: 2017-09-29 | Disposition: A | Discharge status: Home / Self Care | Payer: 59 | Attending: Emergency Medicine | Admitting: Emergency Medicine

## 2017-09-29 DIAGNOSIS — Z79899 Other long term (current) drug therapy: Secondary | ICD-10-CM | POA: Diagnosis not present

## 2017-09-29 DIAGNOSIS — I10 Essential (primary) hypertension: Secondary | ICD-10-CM | POA: Insufficient documentation

## 2017-09-29 DIAGNOSIS — Z880 Allergy status to penicillin: Secondary | ICD-10-CM | POA: Diagnosis not present

## 2017-09-29 HISTORY — DX: Enthesopathy, unspecified: M77.9

## 2017-09-29 HISTORY — DX: Pain in arm, unspecified: M79.603

## 2017-09-29 HISTORY — DX: Cervicalgia: M54.2

## 2017-09-29 LAB — COMPREHENSIVE METABOLIC PANEL
ALBUMIN: 4.2 g/dL (ref 3.5–5.0)
ALK PHOS: 88 U/L (ref 38–126)
ALT: 40 U/L (ref 17–63)
ANION GAP: 10 (ref 5–15)
AST: 25 U/L (ref 15–41)
BILIRUBIN TOTAL: 0.4 mg/dL (ref 0.3–1.2)
BUN: 14 mg/dL (ref 6–20)
CALCIUM: 9.5 mg/dL (ref 8.9–10.3)
CO2: 27 mmol/L (ref 22–32)
Chloride: 102 mmol/L (ref 101–111)
Creatinine, Ser: 1.38 mg/dL — ABNORMAL HIGH (ref 0.61–1.24)
GFR calc non Af Amer: 57 mL/min — ABNORMAL LOW (ref >60)
GLUCOSE: 138 mg/dL — AB (ref 65–99)
POTASSIUM: 4.2 mmol/L (ref 3.5–5.1)
SODIUM: 139 mmol/L (ref 135–145)
TOTAL PROTEIN: 7.4 g/dL (ref 6.5–8.1)

## 2017-09-29 LAB — CBC WITH DIFFERENTIAL/PLATELET
BASOS ABS: 0 10*3/uL (ref 0.0–0.1)
BASOS PCT: 0 %
EOS ABS: 0.3 10*3/uL (ref 0.0–0.7)
Eosinophils Relative: 3 %
HEMATOCRIT: 43.3 % (ref 39.0–52.0)
HEMOGLOBIN: 15.7 g/dL (ref 13.0–17.0)
Lymphocytes Relative: 34 %
Lymphs Abs: 3.2 10*3/uL (ref 0.7–4.0)
MCH: 29.6 pg (ref 26.0–34.0)
MCHC: 36.3 g/dL — ABNORMAL HIGH (ref 30.0–36.0)
MCV: 81.7 fL (ref 78.0–100.0)
MONOS PCT: 8 %
Monocytes Absolute: 0.7 10*3/uL (ref 0.1–1.0)
NEUTROS ABS: 5.2 10*3/uL (ref 1.7–7.7)
NEUTROS PCT: 55 %
Platelets: 318 10*3/uL (ref 150–400)
RBC: 5.3 MIL/uL (ref 4.22–5.81)
RDW: 14.1 % (ref 11.5–15.5)
WBC: 9.3 10*3/uL (ref 4.0–10.5)

## 2017-09-29 LAB — TROPONIN I: Troponin I: <0.03 ng/mL (ref <0.03)

## 2017-09-29 MED ORDER — LISINOPRIL 10 MG PO TABS: 10.0000 mg | ORAL_TABLET | Freq: Every day | ORAL | 1 refills | Status: DC

## 2017-09-29 MED ORDER — LISINOPRIL 10 MG PO TABS
10.0000 mg | ORAL_TABLET | ORAL | Status: AC
  Administered 2017-09-29: 10 mg via ORAL
  Filled 2017-09-29: qty 1

## 2017-09-29 NOTE — ED Provider Notes (Signed)
[facility] EMERGENCY DEPARTMENT Provider Note   CSN: [id number] Arrival date & time: 09/29/17  1546     History   Chief Complaint Chief Complaint  Patient presents with  . Hypertension    HPI Philip Caldwell is a 51 y.o. male.  HPI  The patient is a 51 year old male with a history of high blood pressure, currently taking verapamil.  He has been on this medication for years, in May it was increased in its dose but he states that he continues to have blood pressures that range from 150-160 at times.  He states that normal is 150/90, today he had increased amounts of general fatigue and then felt a pain in his left arm while the blood pressure machine was squeezing his left arm followed by some tingling in his fingertips which concerned him.  He denies any other neurologic symptoms, he states that he has tenderness and a lump in the left arm just above the antecubital fossa on the medial surface.  He notes that his blood pressure has been slightly higher than usual today and is concerned about that.  He has no other complaints including chest pain shortness of breath numbness or weakness of the arms or the legs except for the fingers tips of the left hand.  No difficulty with balance or vision or speech.  Past Medical History:  Diagnosis Date  . A-fib ([place])   . Arm pain, central    with intermittent numbness  . Hypertension   . Neck pain   . Tendonitis     Patient Active Problem List   Diagnosis Date Noted  . Essential hypertension 10/14/2014  . Chronic arthralgias of knees and hips 10/14/2014  . SHOULDER PAIN 07/15/2008  . IMPINGEMENT SYNDROME 07/15/2008  . [place] HEAD 07/15/2008    Past Surgical History:  Procedure Laterality Date  . COLONOSCOPY N/A 10/08/2015   Procedure: COLONOSCOPY;  Surgeon: Rogene Houston, MD;  Location: [facility];  Service: Endoscopy;  Laterality: N/A;  1030  . NERVE SURGERY         Home Medications    Prior to Admission  medications   Medication Sig Start Date End Date Taking? Authorizing Provider  verapamil (CALAN-SR) 240 MG CR tablet Take 1 tablet (240 mg total) by mouth at bedtime. 05/11/17  Yes [name], MD  lisinopril (PRINIVIL,ZESTRIL) 10 MG tablet Take 1 tablet (10 mg total) by mouth daily. 09/29/17   Noemi Chapel, MD    Family History Family History  Problem Relation Age of Onset  . Dementia Mother   . Cancer Father   . Heart attack Brother     Social History Social History  Substance Use Topics  . Smoking status: Never Smoker  . Smokeless tobacco: Never Used  . Alcohol use No     Allergies   Penicillins   Review of Systems Review of Systems  All other systems reviewed and are negative.    Physical Exam Updated Vital Signs BP 132/70 (BP Location: Right Arm)   Pulse (!) 58   Temp 98.7 F (37.1 C) (Oral)   Resp 16   Ht 5\' 9"  (1.753 m)   Wt 86.2 kg (190 lb)   SpO2 97%   BMI 28.06 kg/m   Physical Exam  Constitutional: He appears well-developed and well-nourished. No distress.  HENT:  Head: Normocephalic and atraumatic.  Mouth/Throat: Oropharynx is clear and moist. No oropharyngeal exudate.  Eyes: Pupils are equal, round, and reactive to light. Conjunctivae and  EOM are normal. Right eye exhibits no discharge. Left eye exhibits no discharge. No scleral icterus.  Neck: Normal range of motion. Neck supple. No JVD present. No thyromegaly present.  Cardiovascular: Normal rate, regular rhythm, normal heart sounds and intact distal pulses.  Exam reveals no gallop and no friction rub.   No murmur heard. Pulmonary/Chest: Effort normal and breath sounds normal. No respiratory distress. He has no wheezes. He has no rales.  Abdominal: Soft. Bowel sounds are normal. He exhibits no distension and no mass. There is no tenderness.  Musculoskeletal: Normal range of motion. He exhibits tenderness ( Focal area of tenderness to the left upper extremity proximal to the antecubital fossa  with a small lump, there is no redness, no induration, no warmth, normal range of motion of the left elbow). He exhibits no edema.  Lymphadenopathy:    He has no cervical adenopathy.  Neurological: He is alert. Coordination normal.  Normal strength and sensation of the bilateral upper and lower extremities, he can straight leg raise and grip bilaterally, major muscle groups are 5 out of 5 strength, normal finger-nose-finger, no pronator drift, speech is clear, coordination is normal, cranial nerves III through XII are normal  Skin: Skin is warm and dry. No rash noted. No erythema.  Psychiatric: He has a normal mood and affect. His behavior is normal.  Nursing note and vitals reviewed.    ED Treatments / Results  Labs (all labs ordered are listed, but only abnormal results are displayed) Labs Reviewed  COMPREHENSIVE METABOLIC PANEL - Abnormal; Notable for the following:       Result Value   Glucose, Bld 138 (*)    Creatinine, Ser 1.38 (*)    GFR calc non Af Amer 57 (*)    All other components within normal limits  CBC WITH DIFFERENTIAL/PLATELET - Abnormal; Notable for the following:    MCHC 36.3 (*)    All other components within normal limits  TROPONIN I    EKG  EKG Interpretation None       Radiology No results found.  Procedures Procedures (including critical care time)  Medications Ordered in ED Medications  lisinopril (PRINIVIL,ZESTRIL) tablet 10 mg (10 mg Oral Given 09/29/17 2018)     Initial Impression / Assessment and Plan / ED Course  I have reviewed the triage vital signs and the nursing notes.  Pertinent labs & imaging results that were available during my care of the patient were reviewed by me and considered in my medical decision making (see chart for details).     The patient has no focal neurologic findings, his laboratory data is unremarkable except for a very slight renal insufficiency.  The patient will need to have close follow-up with his  primary care physician but at this point it looks like it would be reasonable to add an additional medication to his already taking verapamil.  I discussed the risks benefits and alternatives of the ACE inhibitor including the risk for chronic cough or angioedema and the patient accepted this as a medication to use.  He will get the first dose to be discharged home.  He has no focal neurologic symptoms to make me concerned about a stroke.  I believe that the left upper extremity has a very small lump that is likely causing the symptoms in the arm right where the blood pressure cuff squeezes and he was taking his blood pressure more frequently today making this the likely culprit.  Final Clinical Impressions(s) / ED Diagnoses  Final diagnoses:  Essential hypertension    New Prescriptions Discharge Medication List as of 09/29/2017  8:19 PM    START taking these medications   Details  lisinopril (PRINIVIL,ZESTRIL) 10 MG tablet Take 1 tablet (10 mg total) by mouth daily., Starting Thu 09/29/2017, Print         Noemi Chapel, MD 10/01/17 2008

## 2017-09-29 NOTE — Telephone Encounter (Signed)
Patient's wife called and stated that patient has a headache, elevated blood pressure of 160/102 with left arm numbness. Advised patient's wife with the elevated blood pressure and symptoms we recommend patient e elevated at the ER. Patient's wife verbalized understanding.

## 2017-09-29 NOTE — ED Triage Notes (Signed)
PT states he has had dizziness and HTN readings at home for the past 3 days. 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

## 2017-10-03 ENCOUNTER — Ambulatory Visit (INDEPENDENT_AMBULATORY_CARE_PROVIDER_SITE_OTHER): Payer: 59 | Admitting: Family Medicine

## 2017-10-03 ENCOUNTER — Encounter: Payer: Self-pay | Admitting: Family Medicine

## 2017-10-03 VITALS — BP 118/76 | Ht 69.0 in | Wt 204.4 lb

## 2017-10-03 DIAGNOSIS — I10 Essential (primary) hypertension: Secondary | ICD-10-CM

## 2017-10-03 DIAGNOSIS — M79622 Pain in left upper arm: Secondary | ICD-10-CM | POA: Diagnosis not present

## 2017-10-03 DIAGNOSIS — Z23 Encounter for immunization: Secondary | ICD-10-CM

## 2017-10-03 MED ORDER — LISINOPRIL 10 MG PO TABS: 10.0000 mg | ORAL_TABLET | Freq: Every day | ORAL | 1 refills | Status: DC

## 2017-10-03 MED ORDER — VERAPAMIL HCL ER 240 MG PO TBCR: 240.0000 mg | EXTENDED_RELEASE_TABLET | Freq: Every day | ORAL | 1 refills | Status: DC

## 2017-10-03 NOTE — Progress Notes (Signed)
   Subjective:    Patient ID: Philip Caldwell, male    DOB: Jun 23, 1966, 51 y.o.   MRN: [id number]  Hypertension  This is a chronic problem. The current episode started more than 1 year ago.  Full emergency room report reviewed in presence of patient Patient in today for ER follow up for hypertension. Patient was seen at the ED on 09/29/17   Patient had elevated blood pressure.  He also had left arm discomfort.  He began to worry about his heart.  Seen in the emergency room.  Negative EKG.  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  Received injection in past which helped.  Compliant with new medication.  No obvious side effects.    Review of Systems No headache, no major weight loss or weight gain, no chest pain no back pain abdominal pain no change in bowel habits complete ROS otherwise negative     Objective:   Physical Exam  Alert and oriented, vitals reviewed and stable, NAD ENT-TM's and ext canals WNL bilat via otoscopic exam Soft palate, tonsils and post pharynx WNL via oropharyngeal exam Neck-symmetric, no masses; thyroid nonpalpable and nontender Pulmonary-no tachypnea or accessory muscle use; Clear without wheezes via auscultation Card--no abnrml murmurs, rhythm reg and rate WNL Carotid pulses symmetric, without bruits Some left arm sensitivity to deep palpation      Assessment & Plan:  Impression 1 hypertension suboptimal discussed with appropriate need to add new medication.  Tolerating the medicine well.  Proper use of medicines and potential side effects discussed  2.  Emergency room visit reviewed with patient at length  3.  Left arm pain.  No cervical neuropathic in description rationale discussed.  Not cardiac in description either rationale discussed.  With focal tenderness musculoskeletal.  No need for further workups along the heart and  neurological side at this time  Medications refilled diet exercise discussed.  Side effects discussed.  Follow-up in 6 months.  Flu shot today.  Multiple questions answered in this regard also  Greater than 50% of this 25 minute face to face visit was spent in counseling and discussion and coordination of care regarding the above diagnosis/diagnosies

## 2017-10-17 ENCOUNTER — Ambulatory Visit: Payer: 59 | Admitting: Family Medicine

## 2017-11-14 ENCOUNTER — Ambulatory Visit (INDEPENDENT_AMBULATORY_CARE_PROVIDER_SITE_OTHER): Payer: 59 | Admitting: Family Medicine

## 2017-11-14 ENCOUNTER — Encounter: Payer: Self-pay | Admitting: Family Medicine

## 2017-11-14 VITALS — BP 122/80 | Temp 98.7°F | Ht 69.0 in | Wt 202.0 lb

## 2017-11-14 DIAGNOSIS — H9203 Otalgia, bilateral: Secondary | ICD-10-CM | POA: Diagnosis not present

## 2017-11-14 NOTE — Progress Notes (Signed)
   Subjective:    Patient ID: Philip Caldwell, male    DOB: 10-Dec-1965, 51 y.o.   MRN: [id number]  Otalgia   There is pain in both ears. This is a new problem. Episode onset: 2 days. He has tried nothing for the symptoms.   Right knee pain for 3 weeks. Pos sig pain with certain motions, constant ache and discomfort   Both ears aching, ringing   No runny nose no cough no cong  ocas tyl prn    No fever     fam and work responsiblilites       Review of Systems  HENT: Positive for ear pain.        Objective:   Physical Exam Alert vitals stable, NAD. Blood pressure good on repeat. HEENT normal. Lungs clear. Heart regular rate and rhythm. Tympanic membrane is normal bilateral  Right knee good range of motion no obvious effusion no joint line tenderness no joint laxity       Assessment & Plan:  Impression bilateral otalgia likely due to cold exposure no evidence of infection discussed  Right knee suboptimal flare of pain etiology uncertain discussed trial of Aleve recommended for both problems recheck if persists

## 2018-04-03 ENCOUNTER — Ambulatory Visit: Payer: 59 | Admitting: Family Medicine

## 2018-04-12 ENCOUNTER — Other Ambulatory Visit: Payer: Self-pay | Admitting: Family Medicine

## 2018-04-13 ENCOUNTER — Other Ambulatory Visit: Payer: Self-pay | Admitting: *Deleted

## 2018-04-13 ENCOUNTER — Telehealth: Payer: Self-pay | Admitting: Family Medicine

## 2018-04-13 MED ORDER — AMLODIPINE BESYLATE 5 MG PO TABS: 5.0000 mg | ORAL_TABLET | Freq: Every day | ORAL | 0 refills | Status: DC

## 2018-04-13 NOTE — Telephone Encounter (Signed)
Because of the shortage of the medicine I would recommend the following.  Stop verapamil.  Use amlodipine 5 mg 1 daily, #30, 2 refills.  I also recommend the patient do a follow-up office visit somewhere within the course of the next few weeks to see how her blood pressure is doing.  Continue lisinopril.  Explain to the pharmacy they need to explained to the patient that this medication should help him with his blood pressure and that they did talk with [place].

## 2018-04-13 NOTE — Telephone Encounter (Signed)
Patient went to his pharmacy to get refill on his blood pressure medication verapamil 240 mg but pharmacist stated they were out  And didn't know when they would get some in so he is completely out and need something called in that close to this to [facility]

## 2018-04-13 NOTE — Telephone Encounter (Signed)
Discussed with pt. Pt verbalized understanding. New med sent to pharm. Pt has an appointment already scheduled end of the month.

## 2018-04-13 NOTE — Telephone Encounter (Signed)
Med on back order 

## 2018-04-20 ENCOUNTER — Other Ambulatory Visit: Payer: Self-pay | Admitting: Family Medicine

## 2018-04-20 ENCOUNTER — Telehealth: Payer: Self-pay | Admitting: Family Medicine

## 2018-04-20 DIAGNOSIS — Z13220 Encounter for screening for lipoid disorders: Secondary | ICD-10-CM

## 2018-04-20 DIAGNOSIS — Z125 Encounter for screening for malignant neoplasm of prostate: Secondary | ICD-10-CM

## 2018-04-20 DIAGNOSIS — Z0000 Encounter for general adult medical examination without abnormal findings: Secondary | ICD-10-CM

## 2018-04-20 DIAGNOSIS — I10 Essential (primary) hypertension: Secondary | ICD-10-CM

## 2018-04-20 NOTE — Telephone Encounter (Signed)
Patient has an appointment on 04/25/18 with Dr. Richardson Landry.  He wants to know if he is due for labs?

## 2018-04-20 NOTE — Telephone Encounter (Signed)
Left message to return call 

## 2018-04-20 NOTE — Telephone Encounter (Signed)
Pt returned call and was notified that lab orders were placed.

## 2018-04-20 NOTE — Telephone Encounter (Signed)
Rep same four tests again

## 2018-04-20 NOTE — Telephone Encounter (Signed)
Last labs PSA, BMP, Hepatic, Lipid

## 2018-04-21 DIAGNOSIS — I10 Essential (primary) hypertension: Secondary | ICD-10-CM | POA: Diagnosis not present

## 2018-04-21 DIAGNOSIS — Z0000 Encounter for general adult medical examination without abnormal findings: Secondary | ICD-10-CM | POA: Diagnosis not present

## 2018-04-21 DIAGNOSIS — Z13220 Encounter for screening for lipoid disorders: Secondary | ICD-10-CM | POA: Diagnosis not present

## 2018-04-22 LAB — BASIC METABOLIC PANEL
BUN/Creatinine Ratio: 12 (ref 9–20)
BUN: 13 mg/dL (ref 6–24)
CALCIUM: 9.5 mg/dL (ref 8.7–10.2)
CHLORIDE: 105 mmol/L (ref 96–106)
CO2: 23 mmol/L (ref 20–29)
Creatinine, Ser: 1.11 mg/dL (ref 0.76–1.27)
GFR calc Af Amer: 87 mL/min/{1.73_m2} (ref >59)
GFR calc non Af Amer: 75 mL/min/{1.73_m2} (ref >59)
GLUCOSE: 91 mg/dL (ref 65–99)
Potassium: 4.3 mmol/L (ref 3.5–5.2)
Sodium: 143 mmol/L (ref 134–144)

## 2018-04-22 LAB — HEPATIC FUNCTION PANEL
ALT: 38 IU/L (ref 0–44)
AST: 21 IU/L (ref 0–40)
Albumin: 4.3 g/dL (ref 3.5–5.5)
Alkaline Phosphatase: 80 IU/L (ref 39–117)
Bilirubin Total: 0.3 mg/dL (ref 0.0–1.2)
Bilirubin, Direct: 0.11 mg/dL (ref 0.00–0.40)
Total Protein: 7 g/dL (ref 6.0–8.5)

## 2018-04-22 LAB — LIPID PANEL
CHOL/HDL RATIO: 5.4 ratio — AB (ref 0.0–5.0)
CHOLESTEROL TOTAL: 182 mg/dL (ref 100–199)
HDL: 34 mg/dL — AB (ref >39)
LDL CALC: 72 mg/dL (ref 0–99)
TRIGLYCERIDES: 382 mg/dL — AB (ref 0–149)
VLDL Cholesterol Cal: 76 mg/dL — ABNORMAL HIGH (ref 5–40)

## 2018-04-22 LAB — PSA: PROSTATE SPECIFIC AG, SERUM: 1.5 ng/mL (ref 0.0–4.0)

## 2018-04-25 ENCOUNTER — Ambulatory Visit (INDEPENDENT_AMBULATORY_CARE_PROVIDER_SITE_OTHER): Payer: 59 | Admitting: Family Medicine

## 2018-04-25 ENCOUNTER — Encounter: Payer: Self-pay | Admitting: Family Medicine

## 2018-04-25 VITALS — BP 134/72 | Ht 69.0 in | Wt 205.4 lb

## 2018-04-25 DIAGNOSIS — Z0000 Encounter for general adult medical examination without abnormal findings: Secondary | ICD-10-CM

## 2018-04-25 DIAGNOSIS — I10 Essential (primary) hypertension: Secondary | ICD-10-CM

## 2018-04-25 MED ORDER — AMLODIPINE BESYLATE 5 MG PO TABS: 5.0000 mg | ORAL_TABLET | Freq: Every day | ORAL | 1 refills | Status: DC

## 2018-04-25 MED ORDER — LISINOPRIL 10 MG PO TABS: 10.0000 mg | ORAL_TABLET | Freq: Every day | ORAL | 1 refills | Status: DC

## 2018-04-25 NOTE — Patient Instructions (Signed)
Results for orders placed or performed in visit on 04/20/18  Lipid Profile  Result Value Ref Range   Cholesterol, Total 182 100 - 199 mg/dL   Triglycerides 382 (H) 0 - 149 mg/dL   HDL 34 (L) >39 mg/dL   VLDL Cholesterol Cal 76 (H) 5 - 40 mg/dL   LDL Calculated 72 0 - 99 mg/dL   Chol/HDL Ratio 5.4 (H) 0.0 - 5.0 ratio  Hepatic function panel  Result Value Ref Range   Total Protein 7.0 6.0 - 8.5 g/dL   Albumin 4.3 3.5 - 5.5 g/dL   Bilirubin Total 0.3 0.0 - 1.2 mg/dL   Bilirubin, Direct 0.11 0.00 - 0.40 mg/dL   Alkaline Phosphatase 80 39 - 117 IU/L   AST 21 0 - 40 IU/L   ALT 38 0 - 44 IU/L  Basic Metabolic Panel (BMET)  Result Value Ref Range   Glucose 91 65 - 99 mg/dL   BUN 13 6 - 24 mg/dL   Creatinine, Ser 1.11 0.76 - 1.27 mg/dL   GFR calc non Af Amer 75 >59 mL/min/1.73   GFR calc Af Amer 87 >59 mL/min/1.73   BUN/Creatinine Ratio 12 9 - 20   Sodium 143 134 - 144 mmol/L   Potassium 4.3 3.5 - 5.2 mmol/L   Chloride 105 96 - 106 mmol/L   CO2 23 20 - 29 mmol/L   Calcium 9.5 8.7 - 10.2 mg/dL  PSA  Result Value Ref Range   Prostate Specific Ag, Serum 1.5 0.0 - 4.0 ng/mL

## 2018-04-25 NOTE — Progress Notes (Signed)
Subjective:    Patient ID: Philip Caldwell, male    DOB: December 24, 1965, 52 y.o.   MRN: [id number]  HPI The patient comes in today for a wellness visit.    A review of their health history was completed.  A review of medications was also completed.  Any needed refills; not at the moment  Eating habits: tries to be health conscious   Falls/  MVA accidents in past few months: none  Regular exercise: not really   Specialist pt sees on regular basis: no  Preventative health issues were discussed.   Additional concerns: none  Results for orders placed or performed in visit on 04/20/18  Lipid Profile  Result Value Ref Range   Cholesterol, Total 182 100 - 199 mg/dL   Triglycerides 382 (H) 0 - 149 mg/dL   HDL 34 (L) >39 mg/dL   VLDL Cholesterol Cal 76 (H) 5 - 40 mg/dL   LDL Calculated 72 0 - 99 mg/dL   Chol/HDL Ratio 5.4 (H) 0.0 - 5.0 ratio  Hepatic function panel  Result Value Ref Range   Total Protein 7.0 6.0 - 8.5 g/dL   Albumin 4.3 3.5 - 5.5 g/dL   Bilirubin Total 0.3 0.0 - 1.2 mg/dL   Bilirubin, Direct 0.11 0.00 - 0.40 mg/dL   Alkaline Phosphatase 80 39 - 117 IU/L   AST 21 0 - 40 IU/L   ALT 38 0 - 44 IU/L  Basic Metabolic Panel (BMET)  Result Value Ref Range   Glucose 91 65 - 99 mg/dL   BUN 13 6 - 24 mg/dL   Creatinine, Ser 1.11 0.76 - 1.27 mg/dL   GFR calc non Af Amer 75 >59 mL/min/1.73   GFR calc Af Amer 87 >59 mL/min/1.73   BUN/Creatinine Ratio 12 9 - 20   Sodium 143 134 - 144 mmol/L   Potassium 4.3 3.5 - 5.2 mmol/L   Chloride 105 96 - 106 mmol/L   CO2 23 20 - 29 mmol/L   Calcium 9.5 8.7 - 10.2 mg/dL  PSA  Result Value Ref Range   Prostate Specific Ag, Serum 1.5 0.0 - 4.0 ng/mL   Blood pressure medicine and blood pressure levels reviewed today with patient. Compliant with blood pressure medicine. States does not miss a dose. No obvious side effects. Blood pressure generally good when checked elsewhere. Watching salt intake.  BP 130s over 80,  134 over  81   Pt compliant with b p meds     BP cks on occasion, has a portable one at hoem '  Review of Systems  Constitutional: Negative for activity change, appetite change and fever.  HENT: Negative for congestion and rhinorrhea.   Eyes: Negative for discharge.  Respiratory: Negative for cough and wheezing.   Cardiovascular: Negative for chest pain.  Gastrointestinal: Negative for abdominal pain, blood in stool and vomiting.  Genitourinary: Negative for difficulty urinating and frequency.  Musculoskeletal: Negative for neck pain.  Skin: Negative for rash.  Allergic/Immunologic: Negative for environmental allergies and food allergies.  Neurological: Negative for weakness and headaches.  Psychiatric/Behavioral: Negative for agitation.  All other systems reviewed and are negative.      Objective:   Physical Exam  Constitutional: He appears well-developed and well-nourished.  HENT:  Head: Normocephalic and atraumatic.  Right Ear: External ear normal.  Left Ear: External ear normal.  Nose: Nose normal.  Mouth/Throat: Oropharynx is clear and moist.  Eyes: Pupils are equal, round, and reactive to light. EOM are normal.  Neck:  Normal range of motion. Neck supple. No thyromegaly present.  Cardiovascular: Normal rate, regular rhythm and normal heart sounds.  No murmur heard. Pulmonary/Chest: Effort normal and breath sounds normal. No respiratory distress. He has no wheezes.  Abdominal: Soft. Bowel sounds are normal. He exhibits no distension and no mass. There is no tenderness.  Genitourinary: Prostate normal and penis normal.  Musculoskeletal: Normal range of motion. He exhibits no edema.  Lymphadenopathy:    He has no cervical adenopathy.  Neurological: He is alert. He exhibits normal muscle tone.  Skin: Skin is warm and dry. No erythema.  Psychiatric: He has a normal mood and affect. His behavior is normal. Judgment normal.  Vitals reviewed.         Assessment & Plan:   Next colon du three more yr impression wellness exam.  Colonoscopy due 3 more years.  Diet discussed.  Exercise discussed.  Flu shots encouraged.  2.  Hypertension.  Long discussion held.  Now on new calcium channel blocker per loss of prior calcium channel blocker from pharmacy production.  Handling well.  Combination doing well maintain same meds  Follow-up in 6 months medications refilled diet exercise discussed and encouraged

## 2018-05-15 ENCOUNTER — Ambulatory Visit (INDEPENDENT_AMBULATORY_CARE_PROVIDER_SITE_OTHER): Payer: 59 | Admitting: Family Medicine

## 2018-05-15 ENCOUNTER — Encounter: Payer: Self-pay | Admitting: Family Medicine

## 2018-05-15 VITALS — BP 132/82 | Ht 69.0 in | Wt 205.4 lb

## 2018-05-15 DIAGNOSIS — R058 Other specified cough: Secondary | ICD-10-CM

## 2018-05-15 DIAGNOSIS — I10 Essential (primary) hypertension: Secondary | ICD-10-CM

## 2018-05-15 DIAGNOSIS — R05 Cough: Secondary | ICD-10-CM

## 2018-05-15 DIAGNOSIS — T464X5A Adverse effect of angiotensin-converting-enzyme inhibitors, initial encounter: Secondary | ICD-10-CM

## 2018-05-15 MED ORDER — LOSARTAN POTASSIUM 50 MG PO TABS: 50.0000 mg | ORAL_TABLET | Freq: Every day | ORAL | 1 refills | Status: DC

## 2018-05-15 NOTE — Progress Notes (Signed)
   Subjective:    Patient ID: Philip Caldwell, male    DOB: 1966/05/17, 52 y.o.   MRN: [id number]  Cough   Patient arrives with a cough for 2 months.  Having really bad cough and terrible coug  Occurs every so often   No con g no drange      No prob with reflux acid   Severe at times   Cough 2 months duration.  Starts as a tickle.  Then becomes very substantial.  Definitely compromised and ability to do what he wants to do.  Review of Systems  Respiratory: Positive for cough.        Objective:   Physical Exam  Alert vitals stable, NAD. Blood pressure good on repeat. HEENT normal. Lungs clear. Heart regular rate and rhythm.       Assessment & Plan:  Impression probable ACE inhibitor cough.  Discussed.  Stop lisinopril.  Start losartan.  Rationale discussed.  Hopefully cough will fade with this.  There is still a very slight chance of cough with losartan but it certainly generic angiotensin blocker currently discussed

## 2018-07-12 ENCOUNTER — Encounter: Payer: Self-pay | Admitting: Family Medicine

## 2018-07-12 ENCOUNTER — Ambulatory Visit (INDEPENDENT_AMBULATORY_CARE_PROVIDER_SITE_OTHER): Payer: 59 | Admitting: Family Medicine

## 2018-07-12 VITALS — BP 128/80 | Temp 98.1°F | Ht 69.0 in | Wt 203.6 lb

## 2018-07-12 DIAGNOSIS — I10 Essential (primary) hypertension: Secondary | ICD-10-CM

## 2018-07-12 DIAGNOSIS — J329 Chronic sinusitis, unspecified: Secondary | ICD-10-CM | POA: Diagnosis not present

## 2018-07-12 MED ORDER — LOSARTAN POTASSIUM 100 MG PO TABS: 100.0000 mg | ORAL_TABLET | Freq: Every day | ORAL | 1 refills | Status: DC

## 2018-07-12 MED ORDER — BENZONATATE 100 MG PO CAPS: 100.0000 mg | ORAL_CAPSULE | Freq: Three times a day (TID) | ORAL | 0 refills | Status: DC | PRN

## 2018-07-12 MED ORDER — CLARITHROMYCIN 500 MG PO TABS: 500.0000 mg | ORAL_TABLET | Freq: Two times a day (BID) | ORAL | 0 refills | Status: DC

## 2018-07-12 NOTE — Progress Notes (Signed)
   Subjective:    Patient ID: Philip Caldwell, male    DOB: 1966-07-07, 52 y.o.   MRN: [id number]  Cough  This is a new problem. The current episode started in the past 7 days. Associated symptoms include nasal congestion and a sore throat. He has tried nothing for the symptoms.   bp numbers have been On the high side    So went back to the lisinopril and the cough came back   Runny nose and sniffling and voice is very hoarse  Cough in chewt cough is poroductive  Last few days on the new b p med       Review of Systems  HENT: Positive for sore throat.   Respiratory: Positive for cough.        Objective:   Physical Exam  Alert, mild malaise. Hydration good Vitals stable. frontal/ maxillary tenderness evident positive nasal congestion. pharynx normal neck supple  lungs clear/no crackles or wheezes. heart regular in rhythm       Assessment & Plan:  Impression rhinosinusitis likely post viral, discussed with patient. plan antibiotics prescribed. Questions answered. Symptomatic care discussed. warning signs discussed. WSL Suboptimal control of hypertension, with recent switch to losartan because of ACE induced cough, or increased milligrams daily rationale discussed.  Follow-up regular appointment

## 2018-07-19 ENCOUNTER — Other Ambulatory Visit: Payer: Self-pay | Admitting: Dermatology

## 2018-07-19 DIAGNOSIS — L821 Other seborrheic keratosis: Secondary | ICD-10-CM | POA: Diagnosis not present

## 2018-07-19 DIAGNOSIS — D229 Melanocytic nevi, unspecified: Secondary | ICD-10-CM | POA: Diagnosis not present

## 2018-07-19 DIAGNOSIS — D485 Neoplasm of uncertain behavior of skin: Secondary | ICD-10-CM | POA: Diagnosis not present

## 2018-07-19 DIAGNOSIS — L918 Other hypertrophic disorders of the skin: Secondary | ICD-10-CM | POA: Diagnosis not present

## 2018-07-21 ENCOUNTER — Encounter: Payer: Self-pay | Admitting: Family Medicine

## 2018-07-21 ENCOUNTER — Other Ambulatory Visit: Payer: Self-pay

## 2018-07-21 ENCOUNTER — Observation Stay (HOSPITAL_COMMUNITY)
Admission: EM | Admit: 2018-07-21 | Discharge: 2018-07-22 | DRG: 638 | Disposition: A | Discharge status: Home / Self Care | Payer: 59 | Attending: Internal Medicine | Admitting: Internal Medicine

## 2018-07-21 ENCOUNTER — Ambulatory Visit (INDEPENDENT_AMBULATORY_CARE_PROVIDER_SITE_OTHER): Payer: 59 | Admitting: Family Medicine

## 2018-07-21 ENCOUNTER — Encounter (HOSPITAL_COMMUNITY): Payer: Self-pay | Admitting: Emergency Medicine

## 2018-07-21 VITALS — BP 128/84 | Temp 98.6°F | Ht 69.0 in | Wt 200.0 lb

## 2018-07-21 DIAGNOSIS — E119 Type 2 diabetes mellitus without complications: Secondary | ICD-10-CM | POA: Diagnosis not present

## 2018-07-21 DIAGNOSIS — E860 Dehydration: Secondary | ICD-10-CM | POA: Diagnosis not present

## 2018-07-21 DIAGNOSIS — R000 Tachycardia, unspecified: Secondary | ICD-10-CM | POA: Diagnosis not present

## 2018-07-21 DIAGNOSIS — R350 Frequency of micturition: Secondary | ICD-10-CM | POA: Diagnosis not present

## 2018-07-21 DIAGNOSIS — Z79899 Other long term (current) drug therapy: Secondary | ICD-10-CM

## 2018-07-21 DIAGNOSIS — I10 Essential (primary) hypertension: Secondary | ICD-10-CM | POA: Diagnosis present

## 2018-07-21 DIAGNOSIS — N179 Acute kidney failure, unspecified: Secondary | ICD-10-CM | POA: Diagnosis present

## 2018-07-21 DIAGNOSIS — E1100 Type 2 diabetes mellitus with hyperosmolarity without nonketotic hyperglycemic-hyperosmolar coma (NKHHC): Secondary | ICD-10-CM | POA: Diagnosis not present

## 2018-07-21 DIAGNOSIS — E871 Hypo-osmolality and hyponatremia: Secondary | ICD-10-CM | POA: Diagnosis not present

## 2018-07-21 DIAGNOSIS — I4891 Unspecified atrial fibrillation: Secondary | ICD-10-CM | POA: Diagnosis present

## 2018-07-21 DIAGNOSIS — R739 Hyperglycemia, unspecified: Secondary | ICD-10-CM | POA: Diagnosis not present

## 2018-07-21 DIAGNOSIS — E1165 Type 2 diabetes mellitus with hyperglycemia: Secondary | ICD-10-CM | POA: Diagnosis not present

## 2018-07-21 LAB — URINALYSIS, ROUTINE W REFLEX MICROSCOPIC
Bacteria, UA: NONE SEEN
Bilirubin Urine: NEGATIVE
Glucose, UA: >=500 mg/dL — AB
HGB URINE DIPSTICK: NEGATIVE
Ketones, ur: NEGATIVE mg/dL
LEUKOCYTES UA: NEGATIVE
NITRITE: NEGATIVE
PROTEIN: NEGATIVE mg/dL
Specific Gravity, Urine: 1.027 (ref 1.005–1.030)
pH: 6 (ref 5.0–8.0)

## 2018-07-21 LAB — COMPREHENSIVE METABOLIC PANEL
ALBUMIN: 4.2 g/dL (ref 3.5–5.0)
ALT: 50 U/L — AB (ref 0–44)
AST: 30 U/L (ref 15–41)
Alkaline Phosphatase: 113 U/L (ref 38–126)
Anion gap: 12 (ref 5–15)
BUN: 16 mg/dL (ref 6–20)
CHLORIDE: 90 mmol/L — AB (ref 98–111)
CO2: 23 mmol/L (ref 22–32)
Calcium: 9.4 mg/dL (ref 8.9–10.3)
Creatinine, Ser: 1.59 mg/dL — ABNORMAL HIGH (ref 0.61–1.24)
GFR calc Af Amer: 55 mL/min — ABNORMAL LOW (ref >60)
GFR calc non Af Amer: 48 mL/min — ABNORMAL LOW (ref >60)
Glucose, Bld: 891 mg/dL (ref 70–99)
Potassium: 4.7 mmol/L (ref 3.5–5.1)
SODIUM: 125 mmol/L — AB (ref 135–145)
Total Bilirubin: 0.7 mg/dL (ref 0.3–1.2)
Total Protein: 7.7 g/dL (ref 6.5–8.1)

## 2018-07-21 LAB — CBC WITH DIFFERENTIAL/PLATELET
BASOS ABS: 0 10*3/uL (ref 0.0–0.1)
Basophils Relative: 0 %
EOS ABS: 0.2 10*3/uL (ref 0.0–0.7)
Eosinophils Relative: 1 %
HCT: 44.4 % (ref 39.0–52.0)
Hemoglobin: 16.3 g/dL (ref 13.0–17.0)
LYMPHS PCT: 15 %
Lymphs Abs: 1.9 10*3/uL (ref 0.7–4.0)
MCH: 30.2 pg (ref 26.0–34.0)
MCHC: 36.7 g/dL — ABNORMAL HIGH (ref 30.0–36.0)
MCV: 82.4 fL (ref 78.0–100.0)
Monocytes Absolute: 0.9 10*3/uL (ref 0.1–1.0)
Monocytes Relative: 7 %
Neutro Abs: 9.5 10*3/uL — ABNORMAL HIGH (ref 1.7–7.7)
Neutrophils Relative %: 77 %
PLATELETS: 323 10*3/uL (ref 150–400)
RBC: 5.39 MIL/uL (ref 4.22–5.81)
RDW: 13.6 % (ref 11.5–15.5)
WBC: 12.5 10*3/uL — AB (ref 4.0–10.5)

## 2018-07-21 LAB — CBC
HEMATOCRIT: 39.2 % (ref 39.0–52.0)
Hemoglobin: 14.7 g/dL (ref 13.0–17.0)
MCH: 30.4 pg (ref 26.0–34.0)
MCHC: 37.5 g/dL — AB (ref 30.0–36.0)
MCV: 81 fL (ref 78.0–100.0)
PLATELETS: 283 10*3/uL (ref 150–400)
RBC: 4.84 MIL/uL (ref 4.22–5.81)
RDW: 13.6 % (ref 11.5–15.5)
WBC: 11.7 10*3/uL — ABNORMAL HIGH (ref 4.0–10.5)

## 2018-07-21 LAB — CBG MONITORING, ED
GLUCOSE-CAPILLARY: 418 mg/dL — AB (ref 70–99)
GLUCOSE-CAPILLARY: 435 mg/dL — AB (ref 70–99)
Glucose-Capillary: >600 mg/dL (ref 70–99)

## 2018-07-21 LAB — POCT GLYCOSYLATED HEMOGLOBIN (HGB A1C): Hemoglobin A1C: 9.9 % — AB (ref 4.0–5.6)

## 2018-07-21 LAB — TROPONIN I: Troponin I: <0.03 ng/mL (ref <0.03)

## 2018-07-21 LAB — GLUCOSE, CAPILLARY
GLUCOSE-CAPILLARY: 132 mg/dL — AB (ref 70–99)
GLUCOSE-CAPILLARY: 173 mg/dL — AB (ref 70–99)
Glucose-Capillary: 221 mg/dL — ABNORMAL HIGH (ref 70–99)
Glucose-Capillary: 163 mg/dL — ABNORMAL HIGH (ref 70–99)
Glucose-Capillary: 126 mg/dL — ABNORMAL HIGH (ref 70–99)
Glucose-Capillary: 165 mg/dL — ABNORMAL HIGH (ref 70–99)

## 2018-07-21 LAB — BASIC METABOLIC PANEL
Anion gap: 7 (ref 5–15)
BUN: 13 mg/dL (ref 6–20)
CHLORIDE: 104 mmol/L (ref 98–111)
CO2: 26 mmol/L (ref 22–32)
CREATININE: 1.17 mg/dL (ref 0.61–1.24)
Calcium: 8.6 mg/dL — ABNORMAL LOW (ref 8.9–10.3)
GFR calc Af Amer: >60 mL/min (ref >60)
GFR calc non Af Amer: >60 mL/min (ref >60)
Glucose, Bld: 167 mg/dL — ABNORMAL HIGH (ref 70–99)
Potassium: 3.3 mmol/L — ABNORMAL LOW (ref 3.5–5.1)
Sodium: 137 mmol/L (ref 135–145)

## 2018-07-21 LAB — LIPASE, BLOOD: Lipase: 70 U/L — ABNORMAL HIGH (ref 11–51)

## 2018-07-21 MED ORDER — DEXTROSE 50 % IV SOLN: 25.0000 mL | INTRAVENOUS | Status: DC | PRN

## 2018-07-21 MED ORDER — POTASSIUM CHLORIDE 10 MEQ/100ML IV SOLN
10.0000 meq | INTRAVENOUS | Status: DC
  Administered 2018-07-21: 10 meq via INTRAVENOUS
  Filled 2018-07-21: qty 100

## 2018-07-21 MED ORDER — SODIUM CHLORIDE 0.9 % IV BOLUS
1000.0000 mL | Freq: Once | INTRAVENOUS | Status: AC
  Administered 2018-07-21: 1000 mL via INTRAVENOUS

## 2018-07-21 MED ORDER — ACETAMINOPHEN 325 MG PO TABS: 650.0000 mg | ORAL_TABLET | Freq: Four times a day (QID) | ORAL | Status: DC | PRN

## 2018-07-21 MED ORDER — INSULIN DETEMIR 100 UNIT/ML ~~LOC~~ SOLN
10.0000 [IU] | Freq: Every day | SUBCUTANEOUS | Status: DC
  Filled 2018-07-21 (×3): qty 0.1

## 2018-07-21 MED ORDER — INSULIN ASPART 100 UNIT/ML ~~LOC~~ SOLN
0.0000 [IU] | SUBCUTANEOUS | Status: DC
  Administered 2018-07-21: 4 [IU] via SUBCUTANEOUS
  Administered 2018-07-22: 8 [IU] via SUBCUTANEOUS

## 2018-07-21 MED ORDER — DEXTROSE-NACL 5-0.45 % IV SOLN: INTRAVENOUS | Status: DC

## 2018-07-21 MED ORDER — AMLODIPINE BESYLATE 5 MG PO TABS
5.0000 mg | ORAL_TABLET | Freq: Every day | ORAL | Status: DC
  Administered 2018-07-22: 5 mg via ORAL
  Filled 2018-07-21: qty 1

## 2018-07-21 MED ORDER — SODIUM CHLORIDE 0.9 % IV SOLN
INTRAVENOUS | Status: DC
  Administered 2018-07-21: 16:00:00 via INTRAVENOUS

## 2018-07-21 MED ORDER — ONDANSETRON HCL 4 MG/2ML IJ SOLN: 4.0000 mg | Freq: Four times a day (QID) | INTRAMUSCULAR | Status: DC | PRN

## 2018-07-21 MED ORDER — SODIUM CHLORIDE 0.9 % IV SOLN
INTRAVENOUS | Status: DC
  Administered 2018-07-21: 17:00:00 via INTRAVENOUS

## 2018-07-21 MED ORDER — SODIUM CHLORIDE 0.9 % IV SOLN
INTRAVENOUS | Status: DC
  Administered 2018-07-21: 3.2 [IU]/h via INTRAVENOUS
  Filled 2018-07-21: qty 1

## 2018-07-21 MED ORDER — INSULIN DETEMIR 100 UNIT/ML ~~LOC~~ SOLN
10.0000 [IU] | Freq: Once | SUBCUTANEOUS | Status: AC
  Administered 2018-07-21: 10 [IU] via SUBCUTANEOUS
  Filled 2018-07-21: qty 0.1

## 2018-07-21 MED ORDER — ENOXAPARIN SODIUM 40 MG/0.4ML ~~LOC~~ SOLN
40.0000 mg | SUBCUTANEOUS | Status: DC
  Filled 2018-07-21: qty 0.4

## 2018-07-21 MED ORDER — ONDANSETRON HCL 4 MG PO TABS: 4.0000 mg | ORAL_TABLET | Freq: Four times a day (QID) | ORAL | Status: DC | PRN

## 2018-07-21 MED ORDER — SODIUM CHLORIDE 0.9 % IV SOLN
INTRAVENOUS | Status: DC
  Administered 2018-07-21: 5.4 [IU]/h via INTRAVENOUS
  Filled 2018-07-21: qty 1

## 2018-07-21 MED ORDER — INSULIN REGULAR BOLUS VIA INFUSION
0.0000 [IU] | Freq: Three times a day (TID) | INTRAVENOUS | Status: DC
  Filled 2018-07-21: qty 10

## 2018-07-21 MED ORDER — ACETAMINOPHEN 650 MG RE SUPP: 650.0000 mg | Freq: Four times a day (QID) | RECTAL | Status: DC | PRN

## 2018-07-21 NOTE — Progress Notes (Signed)
   Subjective:    Patient ID: Philip Caldwell, male    DOB: 03-22-1966, 52 y.o.   MRN: [id number]                                                                                                                       HPI Pt here today with dizzy, sweating, weakness. Pt was here last week for HTN and sinus infection. Pt states he has no energy, chills, was sitting at home and just started sweating. HTN medication increased and pt was placed on ABT for sinus infection. Pt states he is constantly going to bathroom and his mouth is constantly dry like he can not get enough to drink. Denies having chest pain but has felt like he was gasping for air. Other symptoms include coughing, headache, and abdominal pain.    Off work this week  hasnt done a thing  Broke out in sweat while working, no chest pain,,got to feeling dizzy    States no energy  Going to the bathroom every two hrs   Mouth feels very dry, very thirsty  Lots of water around the clock   Congestion is better   Coughs while lying flat on his back     Has no energy at all   Symptoms have been progressing in recent weeks.  Worse this week.  Patient concerned something serious is going on.  Polydipsia.  Minimal polyphasia.  Severe polyuria day and night.    Review of Systems No headache, no major weight loss or weight gain, no chest pain no back pain abdominal pain no change in bowel habits complete ROS otherwise negative     Objective:   Physical Exam Alert and oriented, vitals reviewed and stable, substantial malaise ENT-TM's and ext canals WNL bilat via otoscopic exam Soft palate, tonsils and post pharynx WNL via oropharyngeal exam Neck-symmetric, no masses; thyroid nonpalpable and nontender Pulmonary-no tachypnea or accessory muscle use; Clear without wheezes via auscultation Card--no abnrml  murmurs, rhythm reg and resting tachycardia otherwise WNL Carotid pulses symmetric, without bruits Resting heart rate 115 bpm supine 135 bpm while standing.  Systolic blood pressure 268 supine.  122 standing.      Assessment & Plan:  Impression polyuria polydipsia dizziness and lightheadedness orthostatic features feeling very poorly.  Thought it was new blood pressure medication.  Glucose this spring 1991 fasting.  Glucose today via glucometer to high to measure.  A1c 9.9% office assessment.  Long discussion held.  With orthostatic features.  Resting tachycardia.  History of borderline renal function.  And now diabetes out-of-control needs IV fluids further assessment potentially IV insulin.  Depending on how high the glucose actually is, may require hospitalization, discussed, spoke with the ER doctor and patient we will press on to the emergency room Greater than 50% of this 40 minute face to face visit was spent in counseling and discussion and coordination of care regarding the above diagnosis/diagnosies

## 2018-07-21 NOTE — ED Notes (Addendum)
Date and time results received: 07/21/18 1256   Test: glucose Critical Value: 891  Name of Provider Notified: Dr. Sabra Heck  Orders Received? Or Actions Taken?: No new orders given at this time.

## 2018-07-21 NOTE — ED Provider Notes (Signed)
[facility] EMERGENCY DEPARTMENT Provider Note   CSN: [id number] Arrival date & time: 07/21/18  1047     History   Chief Complaint Chief Complaint  Patient presents with  . Abnormal Lab    HPI Philip Caldwell is a 52 y.o. male.  HPI   The patient is a pleasant 60-year male who has a history significant for high blood pressure currently taking losartan 100 mg daily and amlodipine 5 mg daily.  The patient recently had an antibiotic for an upper respiratory infection, he noted 2 days ago that he became very symptomatic with some shortness of breath while he was trying to paint a bedroom and since that time is had urinary frequency, generalized fatigue.  His symptoms are persistent, gradually worsening, they are not associated with fevers, swelling of the legs, rashes or focal weakness.  He was seen at his family doctor's office today, Dr. Mickie Hillier, he called ahead to let me know that the patient was coming over with severe hyperglycemia.  Review of the medical record confirms the patient's story and laboratory work-up and history  Past Medical History:  Diagnosis Date  . A-fib ([place])   . Arm pain, central    with intermittent numbness  . Hypertension   . Neck pain   . Tendonitis     Patient Active Problem List   Diagnosis Date Noted  . Uncontrolled type 2 diabetes mellitus with hyperosmolar nonketotic hyperglycemia ([place]) 07/21/2018  . Essential hypertension 10/14/2014  . Chronic arthralgias of knees and hips 10/14/2014  . SHOULDER PAIN 07/15/2008  . IMPINGEMENT SYNDROME 07/15/2008  . [place] HEAD 07/15/2008    Past Surgical History:  Procedure Laterality Date  . COLONOSCOPY N/A 10/08/2015   Procedure: COLONOSCOPY;  Surgeon: Rogene Houston, MD;  Location[facility];  Service: Endoscopy;  Laterality: N/A;  1030  . NERVE SURGERY          Home Medications    Prior to Admission medications   Medication Sig Start Date End Date Taking? Authorizing  Provider  amLODipine (NORVASC) 5 MG tablet Take 1 tablet (5 mg total) by mouth daily. 04/25/18   [name], MD  clarithromycin (BIAXIN) 500 MG tablet Take 1 tablet (500 mg total) by mouth 2 (two) times daily. 07/12/18   [name], MD  losartan (COZAAR) 100 MG tablet Take 1 tablet (100 mg total) by mouth daily. 07/12/18   [name], MD    Family History Family History  Problem Relation Age of Onset  . Dementia Mother   . Cancer Father   . Heart attack Brother     Social History Social History   Tobacco Use  . Smoking status: Never Smoker  . Smokeless tobacco: Never Used  Substance Use Topics  . Alcohol use: No  . Drug use: No     Allergies   Lisinopril and Penicillins   Review of Systems Review of Systems  All other systems reviewed and are negative.    Physical Exam Updated Vital Signs BP (!) 142/92   Pulse 93   Temp 98.4 F (36.9 C) (Oral)   Resp 16   Ht 5\' 9"  (1.753 m)   Wt 90.7 kg   SpO2 97%   BMI 29.53 kg/m   Physical Exam  Constitutional: He appears well-developed and well-nourished. No distress.  HENT:  Head: Normocephalic and atraumatic.  Mouth/Throat: No oropharyngeal exudate.  Mucous membranes dry  Eyes: Pupils are equal, round, and reactive to light. Conjunctivae  and EOM are normal. Right eye exhibits no discharge. Left eye exhibits no discharge. No scleral icterus.  Neck: Normal range of motion. Neck supple. No JVD present. No thyromegaly present.  Cardiovascular: Regular rhythm, normal heart sounds and intact distal pulses. Exam reveals no gallop and no friction rub.  No murmur heard. Heart rate of 120  Pulmonary/Chest: Effort normal and breath sounds normal. No respiratory distress. He has no wheezes. He has no rales.  Lung sounds clear, mildly tachypneic  Abdominal: Soft. Bowel sounds are normal. He exhibits no distension and no mass. There is no tenderness.  Musculoskeletal: Normal range of motion. He exhibits no edema  or tenderness.  Lymphadenopathy:    He has no cervical adenopathy.  Neurological: He is alert. Coordination normal.  Skin: Skin is warm and dry. No rash noted. No erythema.  Psychiatric: He has a normal mood and affect. His behavior is normal.  Nursing note and vitals reviewed.    ED Treatments / Results  Labs (all labs ordered are listed, but only abnormal results are displayed) Labs Reviewed  URINALYSIS, ROUTINE W REFLEX MICROSCOPIC - Abnormal; Notable for the following components:      Result Value   Color, Urine STRAW (*)    Glucose, UA >=500 (*)    All other components within normal limits  CBC WITH DIFFERENTIAL/PLATELET - Abnormal; Notable for the following components:   WBC 12.5 (*)    MCHC 36.7 (*)    Neutro Abs 9.5 (*)    All other components within normal limits  COMPREHENSIVE METABOLIC PANEL - Abnormal; Notable for the following components:   Sodium 125 (*)    Chloride 90 (*)    Glucose, Bld 891 (*)    Creatinine, Ser 1.59 (*)    ALT 50 (*)    GFR calc non Af Amer 48 (*)    GFR calc Af Amer 55 (*)    All other components within normal limits  LIPASE, BLOOD - Abnormal; Notable for the following components:   Lipase 70 (*)    All other components within normal limits  CBG MONITORING, ED - Abnormal; Notable for the following components:   Glucose-Capillary >600 (*)    All other components within normal limits  TROPONIN I    EKG EKG Interpretation  Date/Time:  Friday July 21 2018 11:44:50 EDT Ventricular Rate:  103 PR Interval:    QRS Duration: 94 QT Interval:  324 QTC Calculation: 425 R Axis:   113 Text Interpretation:  Sinus tachycardia Right axis deviation Borderline T wave abnormalities ST elev, probable normal early repol pattern Since last tracing rate faster ST elevation similar to prior Confirmed by Noemi Chapel [id number]) on 07/21/2018 12:13:45 PM   Radiology No results found.  Procedures .Critical Care Performed by: Noemi Chapel,  MD Authorized by: Noemi Chapel, MD   Critical care provider statement:    Critical care time (minutes):  35   Critical care time was exclusive of:  Separately billable procedures and treating other patients and teaching time   Critical care was necessary to treat or prevent imminent or life-threatening deterioration of the following conditions:  Endocrine crisis   Critical care was time spent personally by me on the following activities:  Blood draw for specimens, development of treatment plan with patient or surrogate, discussions with consultants, evaluation of patient's response to treatment, examination of patient, obtaining history from patient or surrogate, ordering and performing treatments and interventions, ordering and review of laboratory studies, ordering and review of  radiographic studies, pulse oximetry, re-evaluation of patient's condition and review of old charts   (including critical care time)  Medications Ordered in ED Medications  sodium chloride 0.9 % bolus 1,000 mL (1,000 mLs Intravenous New Bag/Given 07/21/18 1241)  insulin regular (NOVOLIN R,HUMULIN R) 100 Units in sodium chloride 0.9 % 100 mL (1 Units/mL) infusion (has no administration in time range)  sodium chloride 0.9 % bolus 1,000 mL (has no administration in time range)    And  0.9 %  sodium chloride infusion (has no administration in time range)  dextrose 5 %-0.45 % sodium chloride infusion (has no administration in time range)  potassium chloride 10 mEq in 100 mL IVPB (has no administration in time range)  sodium chloride 0.9 % bolus 1,000 mL (0 mLs Intravenous Stopped 07/21/18 1241)     Initial Impression / Assessment and Plan / ED Course  I have reviewed the triage vital signs and the nursing notes.  Pertinent labs & imaging results that were available during my care of the patient were reviewed by me and considered in my medical decision making (see chart for details).  Clinical Course as of Jul 21 1309   Fri Jul 21, 2018  1254 Urinalysis does show glucosuria but no ketonuria, white blood cell count of 88,502, metabolic panel is pending.  Troponin is negative   [BM]    Clinical Course User Index [BM] Noemi Chapel, MD    The patient's polyuria, polydipsia and generalized fatigue with hyperglycemia suggest that the patient may be going towards diabetic ketoacidosis.  His A1c as measured by his family doctor was 9.9, his blood glucose is too high to register on current machine, I suspect that he has some renal dysfunction as well.  He will need IV fluids and likely an insulin drip, he may be in diabetic ketoacidosis.  I also am concerned given his recent upper respiratory infection that this could have been what triggered his diabetes,  Glucose 891  Labs reviewed showing hyponatremia which is a pseudohyponatremia and corrects with glucose modification.  White blood cell count is 12,500 with no anemia, no thrombocytopenia and a negative urinalysis.  Ketones are not seen however the patient is severely hyperglycemic and will require an insulin drip and likely admission to the hospital for hydration as he is persistently mildly tachycardic.  We will discuss with the hospitalist.  Insulin drip started, critical care provided.  Discussed with Dr. Roderic Palau who will admit to the stepdown unit  Final Clinical Impressions(s) / ED Diagnoses   Final diagnoses:  Hyperglycemia  Diabetes mellitus, new onset ([place])  Hyponatremia  AKI (acute kidney injury) [facility])    ED Discharge Orders    None       Noemi Chapel, MD 07/21/18 1310

## 2018-07-21 NOTE — ED Notes (Addendum)
Patient states that he had went to the MD office due to feeling bad.  Was having dizziness, urinary frequency, nausea, and feeling drained.  Did have a cough and cold this week.  Had a physical in May and everything was ok.  Blood sugar was elevated today along with A1C being elevated >9.  Was sent here for further evaluated.  Was sweating really bad and hot and cold last night.

## 2018-07-21 NOTE — H&P (Signed)
History and Physical    Philip Caldwell GGY:[id number] DOB: 1966-01-07 DOA: 07/21/2018  PCP: [name], MD  Patient coming from: Home  I have personally briefly reviewed patient's old medical records in [place]  Chief Complaint: Generalized fatigue, polyuria  HPI: Philip Caldwell is a 52 y.o. male with medical history significant of hypertension who is been feeling generally unwell for the past week.  He reports that he recently changed his antihypertensive because medication from lisinopril to losartan due to persistent cough.  Past week, he is noted generalized fatigue and weakness, polyuria, polydipsia, blurred vision.  He developed a cough with runny nose and had seen his primary care physician and was prescribed a course of clarithromycin.  Unfortunately, his symptoms persisted.  When he went back to his primary care physician's office today, blood sugar was too high to detect.  Point-of-care A1c was noted to be 9.9.  He was referred to the emergency room for evaluation.  Denies any nausea, vomiting, abdominal pain, diarrhea, chest pain.  ED Course: Vitals were noted to be stable.  Glucose noted to be elevated at 891.  Sodium 125.  Creatinine 1.59.  Patient was started on intravenous fluids as well as IV hydration.  Anion gap was 12  Review of Systems: As per HPI otherwise 10 point review of systems negative.    Past Medical History:  Diagnosis Date  . A-fib ([place])   . Arm pain, central    with intermittent numbness  . Hypertension   . Neck pain   . Tendonitis     Past Surgical History:  Procedure Laterality Date  . COLONOSCOPY N/A 10/08/2015   Procedure: COLONOSCOPY;  Surgeon: Rogene Houston, MD;  Location[facility];  Service: Endoscopy;  Laterality: N/A;  1030  . NERVE SURGERY     Social history:  reports that he has never smoked. He has never used smokeless tobacco. He reports that he does not drink alcohol or use drugs.  Allergies  Allergen Reactions    . Lisinopril Cough  . Penicillins Rash    Has patient had a PCN reaction causing immediate rash, facial/tongue/throat swelling, SOB or lightheadedness with hypotension: Yes Has patient had a PCN reaction causing severe rash involving mucus membranes or skin necrosis: No Has patient had a PCN reaction that required hospitalization No Has patient had a PCN reaction occurring within the last 10 years: No If all of the above answers are "NO", then may proceed with Cephalosporin use.      Family History  Problem Relation Age of Onset  . Dementia Mother   . Cancer Father   . Heart attack Brother      Prior to Admission medications   Medication Sig Start Date End Date Taking? Authorizing Provider  amLODipine (NORVASC) 5 MG tablet Take 1 tablet (5 mg total) by mouth daily. 04/25/18  Yes [name], MD  losartan (COZAAR) 100 MG tablet Take 1 tablet (100 mg total) by mouth daily. 07/12/18  Yes [name], MD    Physical Exam: Vitals:   07/21/18 1330 07/21/18 1400 07/21/18 1430 07/21/18 1500  BP: 120/84 123/76 (!) 127/95 138/85  Pulse: 87  89 91  Resp: 15 15 16 13   Temp:      TempSrc:      SpO2: 95% 95% 96% 97%  Weight:      Height:        Constitutional: NAD, calm, comfortable Eyes: PERRL, lids and conjunctivae normal  ENMT: Mucous membranes are moist. Posterior pharynx clear of any exudate or lesions.Normal dentition.  Neck: normal, supple, no masses, no thyromegaly Respiratory: clear to auscultation bilaterally, no wheezing, no crackles. Normal respiratory effort. No accessory muscle use.  Cardiovascular: Regular rate and rhythm, no murmurs / rubs / gallops. No extremity edema. 2+ pedal pulses. No carotid bruits.  Abdomen: no tenderness, no masses palpated. No hepatosplenomegaly. Bowel sounds positive.  Musculoskeletal: no clubbing / cyanosis. No joint deformity upper and lower extremities. Good ROM, no contractures. Normal muscle tone.  Skin: no rashes, lesions,  ulcers. No induration Neurologic: CN 2-12 grossly intact. Sensation intact, DTR normal. Strength 5/5 in all 4.  Psychiatric: Normal judgment and insight. Alert and oriented x 3. Normal mood.    Labs on Admission: I have personally reviewed following labs and imaging studies  CBC: Recent Labs  Lab 07/21/18 1141  WBC 12.5*  NEUTROABS 9.5*  HGB 16.3  HCT 44.4  MCV 82.4  PLT 314   Basic Metabolic Panel: Recent Labs  Lab 07/21/18 1141  NA 125*  K 4.7  CL 90*  CO2 23  GLUCOSE 891*  BUN 16  CREATININE 1.59*  CALCIUM 9.4   GFR: Estimated Creatinine Clearance: 59.1 mL/min (A) (by C-G formula based on SCr of 1.59 mg/dL (H)). Liver Function Tests: Recent Labs  Lab 07/21/18 1141  AST 30  ALT 50*  ALKPHOS 113  BILITOT 0.7  PROT 7.7  ALBUMIN 4.2   Recent Labs  Lab 07/21/18 1141  LIPASE 70*   No results for input(s): AMMONIA in the last 168 hours. Coagulation Profile: No results for input(s): INR, PROTIME in the last 168 hours. Cardiac Enzymes: Recent Labs  Lab 07/21/18 1143  TROPONINI <0.03   BNP (last 3 results) No results for input(s): PROBNP in the last 8760 hours. HbA1C: Recent Labs    07/21/18 1039  HGBA1C 9.9*   CBG: Recent Labs  Lab 07/21/18 1115 07/21/18 1327 07/21/18 1454  GLUCAP >600* >600* 418*   Lipid Profile: No results for input(s): CHOL, HDL, LDLCALC, TRIG, CHOLHDL, LDLDIRECT in the last 72 hours. Thyroid Function Tests: No results for input(s): TSH, T4TOTAL, FREET4, T3FREE, THYROIDAB in the last 72 hours. Anemia Panel: No results for input(s): VITAMINB12, FOLATE, FERRITIN, TIBC, IRON, RETICCTPCT in the last 72 hours. Urine analysis:    Component Value Date/Time   COLORURINE STRAW (A) 07/21/2018 1141   APPEARANCEUR CLEAR 07/21/2018 1141   LABSPEC 1.027 07/21/2018 1141   PHURINE 6.0 07/21/2018 1141   GLUCOSEU >=500 (A) 07/21/2018 1141   HGBUR NEGATIVE 07/21/2018 1141   BILIRUBINUR NEGATIVE 07/21/2018 1141   KETONESUR NEGATIVE  07/21/2018 1141   PROTEINUR NEGATIVE 07/21/2018 1141   NITRITE NEGATIVE 07/21/2018 1141   LEUKOCYTESUR NEGATIVE 07/21/2018 1141    Radiological Exams on Admission: No results found.  EKG: Independently reviewed.  Sinus tachycardia without acute changes  Assessment/Plan Active Problems:   Essential hypertension   Uncontrolled type 2 diabetes mellitus with hyperosmolar nonketotic hyperglycemia ([facility])   Dehydration   Hyponatremia   AKI (acute kidney injury) ([place])     1. Uncontrolled diabetes with hyperosmolar nonketotic hyperglycemia.  Patient is been started on IV fluids and intravenous insulin.  Will transition to subcutaneous insulin once blood sugars are in goal range.  Continue to monitor the stepdown closely.  Continue aggressive IV hydration. 2. Acute kidney injury.  Related to dehydration from hyperglycemia.  Monitor after hydration. 3. Hyponatremia.  Pseudohyponatremia in the setting of severe hyperglycemia.  Continue to monitor his  blood sugar corrects. 4. Hypertension.  Continue on amlodipine.  Hold losartan as his creatinine is elevated.  DVT prophylaxis: Lovenox Code Status: Full code Family Communication: Discussed with wife at bedside Disposition Plan: Discharge home once improved Consults called:   Admission status: Inpatient, stepdown  Kathie Dike MD [facility] Pager [phone]  If 7PM-7AM, please contact night-coverage [email or web address] Password [facility]  07/21/2018, 3:25 PM

## 2018-07-21 NOTE — ED Triage Notes (Signed)
Patient states he was sent here by Dr Wolfgang Phoenix for A1C of 9.9 today. Patient denies history of diabetes. Patient complains of generalized weakness, dizziness, extreme thirst, and urinary frequency x 1 week.

## 2018-07-22 DIAGNOSIS — E119 Type 2 diabetes mellitus without complications: Secondary | ICD-10-CM | POA: Diagnosis not present

## 2018-07-22 DIAGNOSIS — I10 Essential (primary) hypertension: Secondary | ICD-10-CM | POA: Diagnosis not present

## 2018-07-22 DIAGNOSIS — N179 Acute kidney failure, unspecified: Secondary | ICD-10-CM | POA: Diagnosis not present

## 2018-07-22 DIAGNOSIS — R739 Hyperglycemia, unspecified: Secondary | ICD-10-CM | POA: Diagnosis not present

## 2018-07-22 LAB — BASIC METABOLIC PANEL
ANION GAP: 8 (ref 5–15)
Anion gap: 9 (ref 5–15)
Anion gap: 6 (ref 5–15)
BUN: 13 mg/dL (ref 6–20)
BUN: 12 mg/dL (ref 6–20)
BUN: 12 mg/dL (ref 6–20)
CHLORIDE: 101 mmol/L (ref 98–111)
CHLORIDE: 103 mmol/L (ref 98–111)
CHLORIDE: 104 mmol/L (ref 98–111)
CO2: 26 mmol/L (ref 22–32)
CO2: 27 mmol/L (ref 22–32)
CO2: 22 mmol/L (ref 22–32)
Calcium: 8.1 mg/dL — ABNORMAL LOW (ref 8.9–10.3)
Calcium: 8.2 mg/dL — ABNORMAL LOW (ref 8.9–10.3)
Calcium: 8.5 mg/dL — ABNORMAL LOW (ref 8.9–10.3)
Creatinine, Ser: 1.31 mg/dL — ABNORMAL HIGH (ref 0.61–1.24)
Creatinine, Ser: 1.2 mg/dL (ref 0.61–1.24)
Creatinine, Ser: 1.16 mg/dL (ref 0.61–1.24)
GFR calc Af Amer: >60 mL/min (ref >60)
GFR calc Af Amer: >60 mL/min (ref >60)
GFR calc Af Amer: >60 mL/min (ref >60)
GFR calc non Af Amer: >60 mL/min (ref >60)
GFR calc non Af Amer: >60 mL/min (ref >60)
GFR calc non Af Amer: >60 mL/min (ref >60)
GLUCOSE: 273 mg/dL — AB (ref 70–99)
GLUCOSE: 301 mg/dL — AB (ref 70–99)
GLUCOSE: 308 mg/dL — AB (ref 70–99)
POTASSIUM: 3.6 mmol/L (ref 3.5–5.1)
POTASSIUM: 3.9 mmol/L (ref 3.5–5.1)
POTASSIUM: 4.5 mmol/L (ref 3.5–5.1)
Sodium: 135 mmol/L (ref 135–145)
Sodium: 135 mmol/L (ref 135–145)
Sodium: 136 mmol/L (ref 135–145)

## 2018-07-22 LAB — GLUCOSE, CAPILLARY
GLUCOSE-CAPILLARY: 229 mg/dL — AB (ref 70–99)
GLUCOSE-CAPILLARY: 248 mg/dL — AB (ref 70–99)
GLUCOSE-CAPILLARY: 223 mg/dL — AB (ref 70–99)
GLUCOSE-CAPILLARY: 277 mg/dL — AB (ref 70–99)
Glucose-Capillary: 201 mg/dL — ABNORMAL HIGH (ref 70–99)

## 2018-07-22 LAB — MRSA PCR SCREENING
MRSA BY PCR: NEGATIVE
MRSA BY PCR: POSITIVE — AB

## 2018-07-22 MED ORDER — INSULIN ASPART 100 UNIT/ML ~~LOC~~ SOLN
0.0000 [IU] | Freq: Three times a day (TID) | SUBCUTANEOUS | Status: DC
  Administered 2018-07-22: 5 [IU] via SUBCUTANEOUS

## 2018-07-22 MED ORDER — LIVING WELL WITH DIABETES BOOK
Freq: Once | Status: AC
  Administered 2018-07-22: 17:00:00

## 2018-07-22 MED ORDER — LIVING WELL WITH DIABETES BOOK
Freq: Once | Status: AC
  Administered 2018-07-22: 17:00:00
  Filled 2018-07-22: qty 1

## 2018-07-22 MED ORDER — BLOOD GLUCOSE MONITOR KIT: PACK | 0 refills | Status: DC

## 2018-07-22 MED ORDER — INSULIN PEN NEEDLE 29G X 10MM MISC: 0 refills | Status: DC

## 2018-07-22 MED ORDER — INSULIN STARTER KIT- PEN NEEDLES (ENGLISH)
1.0000 | Freq: Once | Status: AC
  Administered 2018-07-22: 1
  Filled 2018-07-22: qty 1

## 2018-07-22 MED ORDER — LOSARTAN POTASSIUM 50 MG PO TABS
100.0000 mg | ORAL_TABLET | Freq: Every day | ORAL | Status: DC
  Administered 2018-07-22: 100 mg via ORAL
  Filled 2018-07-22: qty 2

## 2018-07-22 MED ORDER — INSULIN DETEMIR 100 UNIT/ML ~~LOC~~ SOLN
20.0000 [IU] | Freq: Every day | SUBCUTANEOUS | Status: DC
  Administered 2018-07-22: 20 [IU] via SUBCUTANEOUS
  Filled 2018-07-22 (×3): qty 0.2

## 2018-07-22 MED ORDER — INSULIN ASPART 100 UNIT/ML ~~LOC~~ SOLN: 0.0000 [IU] | Freq: Every day | SUBCUTANEOUS | Status: DC

## 2018-07-22 MED ORDER — INSULIN DETEMIR 100 UNIT/ML FLEXPEN: 20.0000 [IU] | Freq: Every day | SUBCUTANEOUS | 1 refills | Status: DC

## 2018-07-22 NOTE — Discharge Instructions (Signed)
Blood Glucose Monitoring, Adult Monitoring your blood sugar (glucose) helps you manage your diabetes. It also helps you and your health care provider determine how well your diabetes management plan is working. Blood glucose monitoring involves checking your blood glucose as often as directed, and keeping a record (log) of your results over time. Why should I monitor my blood glucose? Checking your blood glucose regularly can:  Help you understand how food, exercise, illnesses, and medicines affect your blood glucose.  Let you know what your blood glucose is at any time. You can quickly tell if you are having low blood glucose (hypoglycemia) or high blood glucose (hyperglycemia).  Help you and your health care provider adjust your medicines as needed.  When should I check my blood glucose? Follow instructions from your health care provider about how often to check your blood glucose. This may depend on:  The type of diabetes you have.  How well-controlled your diabetes is.  Medicines you are taking.  If you have type 1 diabetes:  Check your blood glucose at least 2 times a day.  Also check your blood glucose: ? Before every insulin injection. ? Before and after exercise. ? Between meals. ? 2 hours after a meal. ? Occasionally between 2:00 a.m. and 3:00 a.m., as directed. ? Before potentially dangerous tasks, like driving or using heavy machinery. ? At bedtime.  You may need to check your blood glucose more often, up to 6-10 times a day: ? If you use an insulin pump. ? If you need multiple daily injections (MDI). ? If your diabetes is not well-controlled. ? If you are ill. ? If you have a history of severe hypoglycemia. ? If you have a history of not knowing when your blood glucose is getting low (hypoglycemia unawareness). If you have type 2 diabetes:  If you take insulin or other diabetes medicines, check your blood glucose at least 2 times a day.  If you are on intensive  insulin therapy, check your blood glucose at least 4 times a day. Occasionally, you may also need to check between 2:00 a.m. and 3:00 a.m., as directed.  Also check your blood glucose: ? Before and after exercise. ? Before potentially dangerous tasks, like driving or using heavy machinery.  You may need to check your blood glucose more often if: ? Your medicine is being adjusted. ? Your diabetes is not well-controlled. ? You are ill. What is a blood glucose log?  A blood glucose log is a record of your blood glucose readings. It helps you and your health care provider: ? Look for patterns in your blood glucose over time. ? Adjust your diabetes management plan as needed.  Every time you check your blood glucose, write down your result and notes about things that may be affecting your blood glucose, such as your diet and exercise for the day.  Most glucose meters store a record of glucose readings in the meter. Some meters allow you to download your records to a computer. How do I check my blood glucose? Follow these steps to get accurate readings of your blood glucose: Supplies needed   Blood glucose meter.  Test strips for your meter. Each meter has its own strips. You must use the strips that come with your meter.  A needle to prick your finger (lancet). Do not use lancets more than once.  A device that holds the lancet (lancing device).  A journal or log book to write down your results. Procedure  Wash your hands with soap and water.  Prick the side of your finger (not the tip) with the lancet. Use a different finger each time.  Gently rub the finger until a small drop of blood appears.  Follow instructions that come with your meter for inserting the test strip, applying blood to the strip, and using your blood glucose meter.  Write down your result and any notes. Alternative testing sites  Some meters allow you to use areas of your body other than your finger  (alternative sites) to test your blood.  If you think you may have hypoglycemia, or if you have hypoglycemia unawareness, do not use alternative sites. Use your finger instead.  Alternative sites may not be as accurate as the fingers, because blood flow is slower in these areas. This means that the result you get may be delayed, and it may be different from the result that you would get from your finger.  The most common alternative sites are: ? Forearm. ? Thigh. ? Palm of the hand. Additional tips  Always keep your supplies with you.  If you have questions or need help, all blood glucose meters have a 24-hour hotline number that you can call. You may also contact your health care provider.  After you use a few boxes of test strips, adjust (calibrate) your blood glucose meter by following instructions that came with your meter. This information is not intended to replace advice given to you by your health care provider. Make sure you discuss any questions you have with your health care provider. Document Released: 11/18/2003 Document Revised: 06/04/2016 Document Reviewed: 04/26/2016 Elsevier Interactive Patient Education  2017 [employer] With Diabetes Diabetes (type 1 diabetes mellitus or type 2 diabetes mellitus) is a condition in which the body does not have enough of a hormone called insulin, or the body does not respond properly to insulin. Normally, insulin allows sugars (glucose) to enter cells in the body. The cells use glucose for energy. With diabetes, extra glucose builds up in the blood instead of going into cells, which results in high blood glucose (hyperglycemia). How to manage lifestyle changes Managing diabetes includes medical treatments as well as lifestyle changes. If diabetes is not managed well, serious physical and emotional complications can occur. Taking good care of yourself means that you are responsible for:  Monitoring glucose regularly.  Eating  a healthy diet.  Exercising regularly.  Meeting with health care providers.  Taking medicines as directed.  Some people may feel a lot of stress about managing their diabetes. This is known as emotional distress, and it is very common. Living with diabetes can place you at risk for emotional distress, depression, or anxiety. These disorders can be confusing and can make diabetes management more difficult. How to recognize stress Emotional distress Symptoms of emotional distress include:  Anger about having a diagnosis of diabetes.  Fear or frustration about your diagnosis and the changes you need to make to manage the condition.  Being overly worried about the care that you need or the cost of the care you need.  Feeling like you caused your condition by doing something wrong.  Fear of unpredictable situations, like low or high blood glucose.  Feeling judged by your health care providers.  Feeling very alone with the disease.  Getting too tired or "burned out" with the demands of daily care.  Depression Having diabetes means that you are at a higher risk for depression. Having depression also means  that you are at a higher risk for diabetes. Your health care provider may test (screen) you for symptoms of depression. It is important to recognize depression symptoms and to start treatment for it soon after it is diagnosed. The following are some symptoms of depression:  Loss of interest in things that you used to enjoy.  Trouble sleeping, or often waking up early and not being able to get back to sleep.  A change in appetite.  Feeling tired most of the day.  Feeling nervous and anxious.  Feeling guilty and worrying that you are a burden to others.  Feeling depressed more often than you do not feel that way.  Thoughts of hurting yourself or feeling that you want to die.  If you have any of these symptoms for 2 weeks or longer, reach out to a health care provider. Where  to find support  Ask your health care provider to recommend a therapist who understands both depression and diabetes.  Search for information and support from the American Diabetes Association: www.diabetes.org  Find a certified diabetes educator and make an appointment through [employer]: www.diabeteseducator.org Follow these instructions at home: Managing emotional distress The following are some ways to manage emotional distress:  Talk with your health care provider or certified diabetes educator. Consider working with a counselor or therapist.  Learn as much as you can about diabetes and its treatment. Meet with a certified diabetes educator or take a class to learn how to manage your condition.  Keep a journal of your thoughts and concerns.  Accept that some things are out of your control.  Talk with other people who have diabetes. It can help to talk with others about the emotional distress that you feel.  Find ways to manage stress that work for you. These may include art or music therapy, exercise, meditation, and hobbies.  Seek support from spiritual leaders, family, and friends.  General instructions  Follow your diabetes management plan.  Keep all follow-up visits as told by your health care provider. This is important. Get help right away if:  You have thoughts about hurting yourself or others. If you ever feel like you may hurt yourself or others, or have thoughts about taking your own life, get help right away. You can go to your nearest emergency department or call:  Your local emergency services (911 in the [place]  A suicide crisis helpline, such as the [place] at (309)690-2980. This is open 24 hours a day.  Summary  Diabetes (type 1 diabetes mellitus or type 2 diabetes mellitus) is a condition in which the body does not have enough of a hormone called insulin, or the body does not respond properly  to insulin.  Living with diabetes puts you at risk for medical issues, and it also puts you at risk for emotional issues such as emotional distress, depression, and anxiety.  Recognizing the symptoms of emotional distress and depression may help you avoid problems with your diabetes control. It is important to start treatment for emotional distress and depression soon after they are diagnosed.  Having diabetes means that you are at a higher risk for depression. Ask your health care provider to recommend a therapist who understands both depression and diabetes.  If you experience symptoms of emotional distress or depression, it is important to discuss this with your health care provider, certified diabetes educator, or therapist. This information is not intended to replace advice given to you by  your health care provider. Make sure you discuss any questions you have with your health care provider. Document Released: 03/31/2017 Document Revised: 03/31/2017 Document Reviewed: 03/31/2017 Elsevier Interactive Patient Education  2018 [employer].   Diabetes Mellitus and Sick Day Management Blood sugar (glucose) can be difficult to control when you are sick. Common illnesses that can cause problems for people with diabetes (diabetes mellitus) include colds, fever, flu (influenza), nausea, vomiting, and diarrhea. These illnesses can cause stress and loss of body fluids (dehydration), and those issues can cause blood glucose levels to increase. Because of this, it is very important to take your insulin and diabetes medicines and eat some form of carbohydrate when you are sick. You should make a plan for days when you are sick (sick day plan) as part of your diabetes management plan. You and your health care provider should make this plan in advance. The following guidelines are intended to help you manage an illness that lasts for about 24 hours or less. Your health care provider may also give you more  specific instructions. What do I need to do to manage my blood glucose?  Check your blood glucose every 2-4 hours, or as often as told by your health care provider.  Know your sick day treatment goals. Your target blood glucose levels may be different when you are sick.  If you use insulin, take your usual dose. ? If your blood glucose continues to be too high, you may need to take an additional insulin dose as told by your health care provider.  If you use oral diabetes medicine, you may need to stop taking it if you are not able to eat or drink normally. Ask your health care provider about whether you need to stop taking these medicines while you are sick.  If you use injectable hormone medicines other than insulin to control your diabetes, ask your health care provider about whether you need to stop taking these medicines while you are sick. What else can I do to manage my diabetes when I am sick? Check your ketones  If you have type 1 diabetes, check your urine ketones every 4 hours.  If you have type 2 diabetes, check your urine ketones as often as told by your health care provider. Drink fluids  Drink enough fluid to keep your urine clear or pale yellow. This is especially important if you have a fever, vomiting, or diarrhea. Those symptoms can lead to dehydration.  Follow any instructions from your health care provider about beverages to avoid. ? Do not drink alcohol, caffeine, or drinks that contain a lot of sugar. Take medicines as directed  Take-over-the-counter and prescription medicines only as told by your health care provider.  Check medicine labels for added sugars. Some medicines may contain sugar or types of sugars that can raise your blood glucose level. What foods can I eat when I am sick? You need to eat some form of carbohydrates when you are sick. You should eat 45-50 grams (45-50 g) of carbohydrates every 3-4 hours until you feel better. All of the food choices  below contain about 15 g of carbohydrates. Plan ahead and keep some of these foods around so you have them if you get sick.  4-6 oz (120-177 mL) carbonated beverage that contains sugar, such as regular (not diet) soda. You may be able to drink carbonated beverages more easily if you open the beverage and let it sit at room temperature for a few minutes before  drinking.   of a twin frozen ice pop.  4 oz (120 g) regular gelatin.  4 oz (120 mL) fruit juice.  4 oz (120 g) ice cream or frozen yogurt.  2 oz (60 g) sherbet.  8 oz (240 mL) clear broth or soup.  4 oz (120 g) regular custard.  4 oz (120 g) regular pudding.  8 oz (240 g) plain yogurt.  1 slice bread or toast.  6 saltine crackers.  5 vanilla wafers.  Questions to ask your health care provider Consider asking the following questions so you know what to do on days when you are sick:  Should I adjust my diabetes medicines?  How often do I need to check my blood glucose?  What supplies do I need to manage my diabetes at home when I am sick?  What number can I call if I have questions?  What foods and drinks should I avoid?  Contact a health care provider if:  You develop symptoms of diabetic ketoacidosis, such as: ? Fatigue. ? Weight loss. ? Excessive thirst. ? Light-headedness. ? Fruity or sweet-smelling breath. ? Excessive urination. ? Vision changes. ? Confusion or irritability. ? Nausea. ? Vomiting. ? Rapid breathing. ? Pain in the abdomen. ? Feeling flushed.  You are unable to drink fluids without vomiting.  You have any of the following for more than 6 hours: ? Nausea. ? Vomiting. ? Diarrhea.  Your blood glucose is at or above 240 mg/dL (13.3 mmol/L), even after you take an additional insulin dose.  You have a change in how you think, feel, or act (mental status).  You develop another serious illness.  You have been sick or have had a fever for 2 days or longer and you are not getting  better. Get help right away if:  Your blood glucose is lower than 54 mg/dL (3.0 mmol/L).  You have difficulty breathing.  You have moderate or high ketone levels in your urine.  You used emergency glucagon to treat low blood glucose. Summary  Blood sugar (glucose) can be difficult to control when you are sick. Common illnesses that can cause problems for people with diabetes (diabetes mellitus) include colds, fever, flu (influenza), nausea, vomiting, and diarrhea.  Illnesses can cause stress and loss of body fluids (dehydration), and those issues can cause blood glucose levels to increase.  Make a plan for days when you are sick (sick day plan) as part of your diabetes management plan. You and your health care provider should make this plan in advance.  It is very important to take your insulin and diabetes medicines and to eat some form of carbohydrate when you are sick.  Contact your health care provider if have problems managing your blood glucose levels when you are sick, or if you have been sick or had a fever for 2 days or longer and are not getting better. This information is not intended to replace advice given to you by your health care provider. Make sure you discuss any questions you have with your health care provider. Document Released: 11/18/2003 Document Revised: 08/13/2016 Document Reviewed: 08/13/2016 Elsevier Interactive Patient Education  2018 [employer].   Dialysis Diet Dialysis is a treatment that cleans your blood. It is used when the kidneys are damaged. When you need dialysis, you should watch your diet. This is because some nutrients can build up in your blood between treatments and make you sick. These nutrients are:  Potassium.  Phosphorus.  Sodium.  Your doctor  or dietitian will:  Tell you how much of these you can have.  Tell you if you need to look out for other nutrients too.  Help you plan meals.  Tell you how much to drink each  day.  What do I need to know about this diet?  Limit potassium. Potassium is in milk, fruits, and vegetables.  Limit phosphorus. Phosphorus is in milk, cheese, beans, nuts, and carbonated beverages.  Limit salt (sodium). Foods that have a lot sodium include processed and cured meats, ready-made frozen meals, canned vegetables, and salty snack foods.  Do not use salt substitutes.  Try not to eat whole-grain foods and foods that have a lot of fiber.  Follow your doctor's instructions about how much to drink. You may be told to: ? Write down what you drink. ? Write down foods you eat that are made mostly from water, such as gelatin and soups. ? Drink from small cups.  Ask your doctor if you should take a medicine that binds phosphorus.  Take vitamin and mineral supplements only as told by your doctor.  Eat meat, poultry, fish, and eggs. Limit nuts and beans.  Before you cook potatoes, cut them into small pieces. Then boil them in unsalted water.  Drain all fluid from cooked vegetables and canned fruits before you eat them. What foods can I eat? Grains White bread. White rice. Cooked cereal. Unsalted popcorn. Tortillas. Pasta. Vegetables Fresh or frozen broccoli, carrots, and green beans. Cabbage. Cauliflower. Celery. Cucumbers. Eggplant. Radishes. Zucchini. Fruits Apples. Fresh or frozen berries. Fresh or canned pears, peaches, and pineapple. Grapes. Plums. Meats and Other Protein Sources Fresh or frozen beef, pork, chicken, and fish. Eggs. Low-sodium canned tuna or salmon. Dairy Cream cheese. Heavy cream. Ricotta cheese. Beverages Apple cider. Cranberry juice. Grape juice. Lemonade. Black coffee. Condiments Herbs. Spices. Jam and jelly. Honey. Sweets and Desserts Sherbet. Cakes. Cookies. Fats and Oils Olive oil, canola oil, and safflower oil. Other Non-dairy creamer. Non-dairy whipped topping. Homemade broth without salt. The items listed above may not be a complete  list of recommended foods or beverages. Contact your dietitian for more options. What foods are not recommended? Grains Whole-grain bread. Whole-grain pasta. High-fiber cereal. Vegetables Potatoes. Beets. Tomatoes. Winter squash and pumpkin. Asparagus. Spinach. Parsnips. Fruits Star fruit. Bananas. Oranges. Kiwi. Nectarines. Prunes. Melon. Dried fruit. Avocado. Meats and Other Protein Sources Canned, smoked, and cured meats. Soil scientist. Sardines. Nuts and seeds. Peanut butter. Beans and legumes. Dairy Milk. Buttermilk. Yogurt. Cheese and cottage cheese. Processed cheese spreads. Beverages Orange juice. Prune juice. Carbonated soft drinks. Condiments Salt. Salt substitutes. Soy sauce. Sweets and Desserts Ice cream. Chocolate. Candied nuts. Fats and Oils Butter. Margarine. Other Ready-made frozen meals. Canned soups. The items listed above may not be a complete list of foods and beverages to avoid. Contact your dietitian for more information. This information is not intended to replace advice given to you by your health care provider. Make sure you discuss any questions you have with your health care provider. Document Released: 05/16/2012 Document Revised: 04/22/2016 Document Reviewed: 06/18/2014 Elsevier Interactive Patient Education  2018 [employer].   How to Avoid Diabetes Mellitus Problems You can take action to prevent or slow down problems that are caused by diabetes (diabetes mellitus). Following your diabetes plan and taking care of yourself can reduce your risk of serious or life-threatening complications. Manage your diabetes  Follow instructions from your health care providers about managing your diabetes. Your diabetes may be managed by a  team of health care providers who can teach you how to care for yourself and can answer questions that you have.  Educate yourself about your condition so you can make healthy choices about eating and physical  activity.  Check your blood sugar (glucose) levels as often as directed. Your health care provider will help you decide how often to check your blood glucose level depending on your treatment goals and how well you are meeting them.  Ask your health care provider if you should take low-dose aspirin daily and what dose is recommended for you. Taking low-dose aspirin daily is recommended to help prevent cardiovascular disease. Do not use nicotine or tobacco Do not use any products that contain nicotine or tobacco, such as cigarettes and e-cigarettes. If you need help quitting, ask your health care provider. Nicotine raises your risk for diabetes problems. If you quit using nicotine:  You will lower your risk for heart attack, stroke, nerve disease, and kidney disease.  Your cholesterol and blood pressure may improve.  Your blood circulation will improve.  Keep your blood pressure under control To control your blood pressure:  Follow instructions from your health care provider about meal planning, exercise, and medicines.  Make sure your health care provider checks your blood pressure at every medical visit.  A blood pressure reading consists of two numbers. Generally, the goal is to keep your top number (systolic pressure) at or below 130, and your bottom number (diastolic pressure) at or below 80. Your health care provider may recommend a lower target blood pressure. Your individualized target blood pressure is determined based on:  Your age.  Your medicines.  How long you have had diabetes.  Any other medical conditions you have.  Keep your cholesterol under control To control your cholesterol:  Follow instructions from your health care provider about meal planning, exercise, and medicines.  Have your cholesterol checked at least once a year.  You may be prescribed medicine to lower cholesterol (statin). If you are not taking a statin, ask your health care provider if you should  be.  Controlling your cholesterol may:  Help prevent heart disease and stroke. These are the most common health problems for people with diabetes.  Improve your blood flow.  Schedule and keep yearly physical exams and eye exams Your health care provider will tell you how often you need medical visits depending on your diabetes management plan. Keep all follow-up visits as directed. This is important so possible problems can be identified early and complications can be avoided or treated.  Every visit with your health care provider should include measuring your: ? Weight. ? Blood pressure. ? Blood glucose control.  Your A1c (hemoglobin A1c) level should be checked: ? At least 2 times a year, if you are meeting your treatment goals. ? 4 times a year, if you are not meeting treatment goals or if your treatment goals have changed.  Your blood lipids (lipid profile) should be checked yearly. You should also be checked yearly for protein in your urine (urine microalbumin).  If you have type 1 diabetes, get an eye exam 3-5 years after you are diagnosed, and then once a year after your first exam.  If you have type 2 diabetes, get an eye exam as soon as you are diagnosed, and then once a year after your first exam.  Keep your vaccines current It is recommended that you receive:  A flu (influenza) vaccine every year.  A pneumonia (pneumococcal) vaccine and a  hepatitis B vaccine. If you are age 23 or older, you may get the pneumonia vaccine as a series of two separate shots.  Ask your health care provider which other vaccines may be recommended. Take care of your feet Diabetes may cause you to have poor blood circulation to your legs and feet. Because of this, taking care of your feet is very important. Diabetes can cause:  The skin on the feet to get thinner, break more easily, and heal more slowly.  Nerve damage in your legs and feet, which results in decreased feeling. You may not  notice minor injuries that could lead to serious problems.  To avoid foot problems:  Check your skin and feet every day for cuts, bruises, redness, blisters, or sores.  Schedule a foot exam with your health care provider once every year. This exam includes: ? Inspecting of the structure and skin of your feet. ? Checking the pulses and sensation in your feet.  Make sure that your health care provider performs a visual foot exam at every medical visit.  Take care of your teeth People with poorly controlled diabetes are more likely to have gum (periodontal) disease. Diabetes can make periodontal diseases harder to control. If not treated, periodontal diseases can lead to tooth loss. To prevent this:  Brush your teeth twice a day.  Floss at least once a day.  Visit your dentist 2 times a year.  Drink responsibly Limit alcohol intake to no more than 1 drink a day for nonpregnant women and 2 drinks a day for men. One drink equals 12 oz of beer, 5 oz of wine, or 1 oz of hard liquor. It is important to eat food when you drink alcohol to avoid low blood glucose (hypoglycemia). Avoid alcohol if you:  Have a history of alcohol abuse or dependence.  Are pregnant.  Have liver disease, pancreatitis, advanced neuropathy, or severe hypertriglyceridemia.  Lessen stress Living with diabetes can be stressful. When you are experiencing stress, your blood glucose may be affected in two ways:  Stress hormones may cause your blood glucose to rise.  You may be distracted from taking good care of yourself.  Be aware of your stress level and make changes to help you manage challenging situations. To lower your stress levels:  Consider joining a support group.  Do planned relaxation or meditation.  Do a hobby that you enjoy.  Maintain healthy relationships.  Exercise regularly.  Work with your health care provider or a mental health professional.  Summary  You can take action to prevent or  slow down problems that are caused by diabetes (diabetes mellitus). Following your diabetes plan and taking care of yourself can reduce your risk of serious or life-threatening complications.  Follow instructions from your health care providers about managing your diabetes. Your diabetes may be managed by a team of health care providers who can teach you how to care for yourself and can answer questions that you have.  Your health care provider will tell you how often you need medical visits depending on your diabetes management plan. Keep all follow-up visits as directed. This is important so possible problems can be identified early and complications can be avoided or treated. This information is not intended to replace advice given to you by your health care provider. Make sure you discuss any questions you have with your health care provider. Document Released: 08/03/2011 Document Revised: 08/14/2016 Document Reviewed: 08/14/2016 Elsevier Interactive Patient Education  [employer].  Insulin Injection Instructions, Using Insulin Pens, Adult A subcutaneous injection is a shot of medicine that is injected into the layer of fat between skin and muscle. People with type 1 diabetes must take insulin because their bodies do not make it. People with type 2 diabetes may need to take insulin. There are many different types of insulin. The type of insulin that you take may determine how many injections you give yourself and when you need to take the injections. Choosing a site for injection Insulin absorption varies from site to site. It is best to inject insulin within the same body area, using a different spot in that area for each injection. Do not inject the insulin in the same spot for each injection. There are five main areas that can be used for injecting. These areas include:  Abdomen. This is the preferred area.  Front of thigh.  Upper, outer side of thigh.  Back of upper  arm.  Buttocks.  Using an insulin pen First, follow the steps for Getting Ready, then continue with the steps for Injecting the Insulin. Getting Ready 1. Wash your hands with soap and water. If soap and water are not available, use hand sanitizer. 2. Check the expiration date and type of insulin in the pen. 3. If you are using CLEAR insulin, check to see that it is clear and free of clumps. 4. If you are using CLOUDY insulin, gently roll the pen between your palms several times, or tip the pen up and down several times to mix up the medicine. Do not shake the pen. 5. Remove the cap from the insulin pen. 6. Use an alcohol wipe to clean the rubber stopper of the pen cartridge. 7. Remove the protective paper tab from the disposable needle. Do not let the needle touch anything. 8. Screw the needle onto the pen. 9. Remove the outer and inner plastic covers from the needle. Do not throw away the outer plastic cover yet. 10. Prime the insulin pen by turning the button (dial) to 2 units. Hold the pen with the needle pointing up, and push the button on the opposite end of the pen until a drop of insulin appears at the needle tip. If no insulin appears, repeat this step. 11. Dial the number of units of insulin that you will be injecting. Injecting the Insulin  1. Use an alcohol wipe to clean the site where you will be injecting the needle. Let the site air-dry. 2. Hold the pen in the palm of your writing hand with your thumb on the top. 3. If directed by your health care provider, use your other hand to pinch and hold about an inch of skin at the injection site. Do not directly touch the cleaned part of the skin. 4. Gently but quickly, put the needle straight into the skin. The needle should be at a 90-degree angle (perpendicular) to the skin, as if to form the letter "L." ? For example, if you are giving an injection in the abdomen, the abdomen forms one "leg" of the "L" and the needle forms the other  "leg" of the "L." 5. For adults who have a small amount of body fat, the needle may need to be injected at a 45-degree angle instead. Your health care provider will tell you if this is necessary. ? A 45-degree angle looks like the letter "V." 6. When the needle is completely inserted into the skin, use the thumb of your writing hand to push the  top button of the pen down all the way to inject the insulin. 7. Let go of the skin that you are pinching. Continue to hold the pen in place with your writing hand. 8. Wait five seconds, then pull the needle straight out of the skin. 9. Carefully put the larger (outer) plastic cover of the needle back over the needle, then unscrew the capped needle and discard it in a sharps container, such as an empty plastic bottle with a cover. 10. Put the plastic cap back on the insulin pen. Throwing away supplies  Discard all used needles in a puncture-proof sharps disposal container. You can ask your local pharmacy about where you can get this kind of disposal container, or you can use an empty liquid laundry detergent bottle that has a cover.  Follow the disposal regulations for the area where you live. Do not use any needle more than one time.  Throw away empty disposable pens in the regular trash. What questions should I ask my health care provider?  How often should I be taking insulin?  How often should I check my blood glucose?  What amount of insulin should I be taking at each time?  What are the side effects?  What should I do if my blood glucose is too high?  What should I do if my blood glucose is too low?  What should I do if I forget to take my insulin?  What number should I call if I have questions? Where can I get more information?  American Diabetes Association (ADA): www.diabetes.org  American Association of Diabetes Educators (AADE) Patient Resources: https://www.diabeteseducator.org/patient-resources This information is not intended  to replace advice given to you by your health care provider. Make sure you discuss any questions you have with your health care provider. Document Released: 12/19/2015 Document Revised: 04/22/2016 Document Reviewed: 12/19/2015 Elsevier Interactive Patient Education  2018 [place].   Insulin Storage and Care All insulin pens and bottles (vials) have expiration dates. Refrigerated, unopened insulin pens, insulin cartridges, and insulin vials are good until the expiration date. Do not use insulin after this date. Once insulin is opened, it should be used within a certain time period. Opened means once the rubber is punctured. Always follow the instructions that come with your insulin. Storing and caring for your insulin  If insulin is kept at room temperature, the temperature must be less than 50F (30C). Some types of insulin can be stored only at less than 5F (25C).  If insulin is kept in the refrigerator, the temperature must be between 55F and 55F (3C and 8C).  Do not freeze insulin.  Keep insulin away from direct heat or sunlight.  Throw away the insulin if it is discolored, thick, or has clumps or suspended white particles in it.  Be sure to mix cloudy insulin by rolling between your hands gently. Pens can be rocked from end to end.  Opened insulin pens should be kept at room temperature.  Always have extra insulin on hand.  Never leave insulin in your vehicle. This information is not intended to replace advice given to you by your health care provider. Make sure you discuss any questions you have with your health care provider. Document Released: 09/12/2009 Document Revised: 04/22/2016 Document Reviewed: 04/05/2013 Elsevier Interactive Patient Education  2017 [place] for Eating Away From Home If You Have Diabetes Controlling your level of blood glucose, also known as blood sugar, can be challenging. It can be even  more difficult when you do not prepare  your own meals. The following tips can help you manage your diabetes when you eat away from home. Planning ahead Plan ahead if you know you will be eating away from home:  Ask your health care provider how to time meals and medicine if you are taking insulin.  Make a list of restaurants near you that offer healthy choices. If they have a carry-out menu, take it home and plan what you will order ahead of time.  Look up the restaurant you want to eat at online. Many chain and fast-food restaurants list nutritional information online. Use this information to choose the healthiest options and to calculate how many carbohydrates will be in your meal.  Use a carbohydrate-counting book or mobile app to look up the carbohydrate content and serving size of the foods you want to eat.  Become familiar with serving sizes and learn to recognize how many servings are in a portion. This will allow you to estimate how many carbohydrates you can eat.  Free foods A "free food" is any food or drink that has less than 5 g of carbohydrates per serving. Free foods include:  Many vegetables.  Hard boiled eggs.  Nuts or seeds.  Olives.  Cheeses.  Meats.  These types of foods make good appetizer choices and are often available at salad bars. Lemon juice, vinegar, or a low-calorie salad dressing of fewer than 20 calories per serving can be used as a "free" salad dressing. Choices to reduce carbohydrates  Substitute nonfat sweetened yogurt with a sugar-free yogurt. Yogurt made from soy milk may also be used, but you will still want a sugar-free or plain option to choose a lower carbohydrate amount.  Ask your server to take away the bread basket or chips from your table.  Order fresh fruit. A salad bar often offers fresh fruit choices. Avoid canned fruit because it is usually packed in sugar or syrup.  Order a salad, and eat it without dressing. Or, create a "free" salad dressing.  Ask for substitutions.  For example, instead of [place] fries, request an order of a vegetable such as salad, green beans, or broccoli. Other tips  If you take insulin, take the insulin once your food arrives to your table. This will ensure your insulin and food are timed correctly.  Ask your server about the portion size before your order, and ask for a take-out box if the portion has more servings than you should have. When your food comes, leave the amount you should have on the plate, and put the rest in the take-out box.  Consider splitting an entree with someone and ordering a side salad. This information is not intended to replace advice given to you by your health care provider. Make sure you discuss any questions you have with your health care provider. Document Released: 11/15/2005 Document Revised: 04/22/2016 Document Reviewed: 02/12/2014 Elsevier Interactive Patient Education  2018 [employer].   Type 2 Diabetes Mellitus, Diagnosis, Adult Type 2 diabetes (type 2 diabetes mellitus) is a long-term (chronic) disease. It may be caused by one or both of these problems:  Your body does not make enough of a hormone called insulin.  Your body does not react in a normal way to insulin that it makes.  Insulin lets sugars (glucose) go into cells in the body. This gives you energy. If you have type 2 diabetes, sugars cannot get into cells. This causes high blood sugar (hyperglycemia). Your doctor  will set treatment goals for you. Generally, you should have these blood sugar levels:  Before meals (preprandial): 80-130 mg/dL (4.4-7.2 mmol/L).  After meals (postprandial): below 180 mg/dL (10 mmol/L).  A1c (hemoglobin A1c) level: less than 7%.  Follow these instructions at home: Questions to Ask Your Doctor  You may want to ask these questions:  Do I need to meet with a diabetes educator?  Where can I find a support group for people with diabetes?  What equipment will I need to care for myself at  home?  What diabetes medicines do I need? When should I take them?  How often do I need to check my blood sugar?  What number can I call if I have questions?  When is my next doctor's visit?  General instructions  Take over-the-counter and prescription medicines only as told by your doctor.  Keep all follow-up visits as told by your doctor. This is important. Contact a doctor if:  Your blood sugar is at or above 240 mg/dL (13.3 mmol/L) for 2 days in a row.  You have been sick or have had a fever for 2 days or more and you are not getting better.  You have any of these problems for more than 6 hours: ? You cannot eat or drink. ? You feel sick to your stomach (nauseous). ? You throw up (vomit). ? You have watery poop (diarrhea). Get help right away if:  Your blood sugar is lower than 54 mg/dL (3 mmol/L).  You get confused.  You have trouble: ? Thinking clearly. ? Breathing.  You have moderate or large ketone levels in your pee (urine). This information is not intended to replace advice given to you by your health care provider. Make sure you discuss any questions you have with your health care provider. Document Released: 08/24/2008 Document Revised: 04/22/2016 Document Reviewed: 12/19/2015 Elsevier Interactive Patient Education  [employer].

## 2018-07-22 NOTE — Progress Notes (Signed)
Nutrition Brief Note  RD consulted for Diet education for new onset diabetes.   RD not on site on weekends. RD will follow up early next week If still admitted.   Recommend providing with reading material and referring to outpatient nutrition/diabetes weight management center.   Burtis Junes RD, LDN, CNSC Clinical Nutrition Available Tues-Sat via Pager: [phone] 07/22/2018 2:18 PM

## 2018-07-22 NOTE — Discharge Summary (Signed)
Physician Discharge Summary  Philip Caldwell WJX:[id number] DOB: 1966-04-21 DOA: 07/21/2018  PCP: [name], MD  Admit date: 07/21/2018 Discharge date: 07/22/2018  Admitted From: Home Disposition: Home  Recommendations for Outpatient Follow-up:  1. Follow up with PCP in 1-2 weeks 2. Please obtain BMP/CBC in one week  Discharge Condition: stable CODE STATUS:full code Diet recommendation: carb modified  Brief/Interim Summary: 52 year old male admitted to the hospital with 1 week history of polyuria, polydipsia and generalized fatigue.  Found to have new onset diabetes with blood sugar greater than 800.  He did not have any significant anion gap.  He was admitted to the hospital for hyperglycemic hyperosmolar state.  Treated with intravenous insulin and IV fluids.  Blood sugars improved.  Hydration status also improved.  Mild AKI due to dehydration resolved with IV fluids.  He was transitioned to subcutaneous insulin.  Blood sugars have been reasonable.  He can follow-up with his primary care physician for further titration of insulin.  Patient received education on administering insulin and diabetic diet.    Discharge Diagnoses:  Active Problems:   Essential hypertension   Uncontrolled type 2 diabetes mellitus with hyperosmolar nonketotic hyperglycemia (HCC)   Dehydration   Hyponatremia   AKI (acute kidney injury) [facility])    Discharge Instructions  Discharge Instructions    Diet - low sodium heart healthy   Complete by:  As directed    Increase activity slowly   Complete by:  As directed      Allergies as of 07/22/2018      Reactions   Lisinopril Cough   Penicillins Rash   Has patient had a PCN reaction causing immediate rash, facial/tongue/throat swelling, SOB or lightheadedness with hypotension: Yes Has patient had a PCN reaction causing severe rash involving mucus membranes or skin necrosis: No Has patient had a PCN reaction that required hospitalization No Has patient  had a PCN reaction occurring within the last 10 years: No If all of the above answers are "NO", then may proceed with Cephalosporin use.      Medication List    TAKE these medications   amLODipine 5 MG tablet Commonly known as:  NORVASC Take 1 tablet (5 mg total) by mouth daily.   blood glucose meter kit and supplies Kit Dispense based on patient and insurance preference. Use up to four times daily as directed. (FOR ICD-9 250.00, 250.01).   insulin detemir 100 unit/ml Soln Commonly known as:  LEVEMIR Inject 0.2 mLs (20 Units total) into the skin daily.   Insulin Pen Needle 29G X 10MM Misc Use as directed   losartan 100 MG tablet Commonly known as:  COZAAR Take 1 tablet (100 mg total) by mouth daily.       Allergies  Allergen Reactions  . Lisinopril Cough  . Penicillins Rash    Has patient had a PCN reaction causing immediate rash, facial/tongue/throat swelling, SOB or lightheadedness with hypotension: Yes Has patient had a PCN reaction causing severe rash involving mucus membranes or skin necrosis: No Has patient had a PCN reaction that required hospitalization No Has patient had a PCN reaction occurring within the last 10 years: No If all of the above answers are "NO", then may proceed with Cephalosporin use.      Consultations:     Procedures/Studies:  No results found.    Subjective: No nausea or vomiting.  Feeling better.  Discharge Exam: Vitals:   07/22/18 1300 07/22/18 1400 07/22/18 1500 07/22/18 1632  BP:  140/89 Marland Kitchen)  142/97   Pulse: 73 77 78   Resp: _0 Temp:    98.1 F (36.7 C)  TempSrc:    Oral  SpO2: 96% 96% 95%   Weight:      Height:        General: Pt is alert, awake, not in acute distress Cardiovascular: RRR, S1/S2 +, no rubs, no gallops Respiratory: CTA bilaterally, no wheezing, no rhonchi Abdominal: Soft, NT, ND, bowel sounds + Extremities: no edema, no cyanosis    The results of significant diagnostics from this  hospitalization (including imaging, microbiology, ancillary and laboratory) are listed below for reference.     Microbiology: Recent Results (from the past 240 hour(s))  MRSA PCR Screening     Status: None   Collection Time: 07/21/18  5:13 PM  Result Value Ref Range Status   MRSA by PCR NEGATIVE NEGATIVE Final    Comment:        The GeneXpert MRSA Assay (FDA approved for NASAL specimens only), is one component of a comprehensive MRSA colonization surveillance program. It is not intended to diagnose MRSA infection nor to guide or monitor treatment for MRSA infections. Performed at [facility], [address] [place], [place] [address]      Labs: BNP (last 3 results) No results for input(s): BNP in the last 8760 hours. Basic Metabolic Panel: Recent Labs  Lab 07/21/18 1141 07/21/18 1822 07/22/18 0013 07/22/18 0606 07/22/18 1153  NA 125* 137 135 135 136  K 4.7 3.3* 4.5 3.6 3.9  CL 90* 104 104 103 101  CO2 _1 GLUCOSE 891* 167* 308* 301* 273*  BUN _2 CREATININE 1.59* 1.17 1.31* 1.20 1.16  CALCIUM 9.4 8.6* 8.1* 8.2* 8.5*   Liver Function Tests: Recent Labs  Lab 07/21/18 1141  AST 30  ALT 50*  ALKPHOS 113  BILITOT 0.7  PROT 7.7  ALBUMIN 4.2   Recent Labs  Lab 07/21/18 1141  LIPASE 70*   No results for input(s): AMMONIA in the last 168 hours. CBC: Recent Labs  Lab 07/21/18 1141 07/21/18 1822  WBC 12.5* 11.7*  NEUTROABS 9.5*  --   HGB 16.3 14.7  HCT 44.4 39.2  MCV 82.4 81.0  PLT 323 283   Cardiac Enzymes: Recent Labs  Lab 07/21/18 1143  TROPONINI <0.03   BNP: Invalid input(s): POCBNP CBG: Recent Labs  Lab 07/21/18 2301 07/22/18 0437 07/22/18 0813 07/22/18 1143 07/22/18 1619  GLUCAP 173* 229* 248* 223* 201*   D-Dimer No results for input(s): DDIMER in the last 72 hours. Hgb A1c Recent Labs    07/21/18 1039  HGBA1C 9.9*   Lipid Profile No results for input(s): CHOL, HDL, LDLCALC, TRIG, CHOLHDL, LDLDIRECT  in the last 72 hours. Thyroid function studies No results for input(s): TSH, T4TOTAL, T3FREE, THYROIDAB in the last 72 hours.  Invalid input(s): FREET3 Anemia work up No results for input(s): VITAMINB12, FOLATE, FERRITIN, TIBC, IRON, RETICCTPCT in the last 72 hours. Urinalysis    Component Value Date/Time   COLORURINE STRAW (A) 07/21/2018 1141   APPEARANCEUR CLEAR 07/21/2018 1141   LABSPEC 1.027 07/21/2018 1141   PHURINE 6.0 07/21/2018 1141   GLUCOSEU >=500 (A) 07/21/2018 1141   HGBUR NEGATIVE 07/21/2018 1141   BILIRUBINUR NEGATIVE 07/21/2018 1141   KETONESUR NEGATIVE 07/21/2018 1141   PROTEINUR NEGATIVE 07/21/2018 1141   NITRITE NEGATIVE 07/21/2018 1141   LEUKOCYTESUR NEGATIVE 07/21/2018 1141   Sepsis Labs Invalid input(s): PROCALCITONIN,  WBC,  [place] [facility] Recent Results (from the past 240 hour(s))  MRSA PCR Screening     Status: None   Collection Time: 07/21/18  5:13 PM  Result Value Ref Range Status   MRSA by PCR NEGATIVE NEGATIVE Final    Comment:        The GeneXpert MRSA Assay (FDA approved for NASAL specimens only), is one component of a comprehensive MRSA colonization surveillance program. It is not intended to diagnose MRSA infection nor to guide or monitor treatment for MRSA infections. Performed at [facility], [address] [place], [place] [address]      Time coordinating discharge: 40mns  SIGNED:   JKathie Dike MD  [facility] 07/22/2018, 4:41 PM Pager   If 7PM-7AM, please contact night-coverage [email or web address] Password [email or web address]

## 2018-07-22 NOTE — Progress Notes (Signed)
Inpatient Diabetes Program Recommendations  AACE/ADA: New Consensus Statement on Inpatient Glycemic Control (2015)  Target Ranges:  Prepandial:   less than 140 mg/dL      Peak postprandial:   less than 180 mg/dL (1-2 hours)      Critically ill patients:  140 - 180 mg/dL   Lab Results  Component Value Date   GLUCAP 201 (H) 07/22/2018   HGBA1C 9.9 (A) 07/21/2018    Review of Glycemic Control  Diabetes history: New onset DM2 Outpatient Diabetes medications: None Current orders for Inpatient glycemic control: levemir 20 units + Novolog moderate correction tid + hs  Inpatient Diabetes Program Recommendations:   Noted patient is new onset DM2. Nurses, please teach patient to use insulin pen and allow patient to give own injections as she is able to along with DM teaching videos. Ordered Living Well With Diabetes and dietician has seen patient in consult.  Will follow.  Thank you, Nani Gasser. Mclean Moya, RN, MSN, CDE  Diabetes Coordinator Inpatient Glycemic Control Team Team Pager [phone] (8am-5pm) 07/22/2018 4:51 PM

## 2018-07-23 ENCOUNTER — Other Ambulatory Visit: Payer: Self-pay | Admitting: Family Medicine

## 2018-07-23 ENCOUNTER — Telehealth: Payer: Self-pay | Admitting: Family Medicine

## 2018-07-23 LAB — HIV ANTIBODY (ROUTINE TESTING W REFLEX): HIV SCREEN 4TH GENERATION: NONREACTIVE

## 2018-07-23 MED ORDER — METFORMIN HCL 500 MG PO TABS: 500.0000 mg | ORAL_TABLET | Freq: Two times a day (BID) | ORAL | 3 refills | Status: DC

## 2018-07-23 NOTE — Telephone Encounter (Signed)
Front-please put patient ton schedule-Dr. Richardson Landry schedule for 3 PM on Monday, August 26.  Patient is aware  Patient was in the hospital for uncontrolled type 2 diabetes was given IV insulin and sent home on Levemir 20 units daily patient calls today because glucose readings are in the low 300s to mid 300s after eating lunch  [facility] nurse was going to discuss with patient low starch diet avoid sugary drinks continue Levemir daily and to come be seen on Monday  Also metformin called in 500 mg 1 twice daily #60 with 2 ref[facility], he will take 1 tablet today then starting tomorrow 1 with breakfast one with supper  Further diabetic education and management at office visit on Monday with Dr. Richardson Landry  If glucose readings above 500 call back

## 2018-07-24 ENCOUNTER — Encounter: Payer: Self-pay | Admitting: Family Medicine

## 2018-07-24 ENCOUNTER — Ambulatory Visit (INDEPENDENT_AMBULATORY_CARE_PROVIDER_SITE_OTHER): Payer: 59 | Admitting: Family Medicine

## 2018-07-24 VITALS — BP 118/76 | Ht 69.0 in | Wt 200.0 lb

## 2018-07-24 DIAGNOSIS — I10 Essential (primary) hypertension: Secondary | ICD-10-CM | POA: Diagnosis not present

## 2018-07-24 DIAGNOSIS — E1165 Type 2 diabetes mellitus with hyperglycemia: Secondary | ICD-10-CM

## 2018-07-24 NOTE — Patient Instructions (Signed)
Hypoglycemia  Hypoglycemia occurs when the level of sugar (glucose) in the blood is too low. Glucose is a type of sugar that provides the body's main source of energy. Certain hormones (insulin and glucagon) control the level of glucose in the blood. Insulin lowers blood glucose, and glucagon increases blood glucose. Hypoglycemia can result from having too much insulin in the bloodstream, or from not eating enough food that contains glucose.  Hypoglycemia can happen in people who do or do not have diabetes. It can develop quickly, and it can be a medical emergency.  What are the causes?  Hypoglycemia occurs most often in people who have diabetes. If you have diabetes, hypoglycemia may be caused by:   Diabetes medicine.   Not eating enough, or not eating often enough.   Increased physical activity.   Drinking alcohol, especially when you have not eaten recently.    If you do not have diabetes, hypoglycemia may be caused by:   A tumor in the pancreas. The pancreas is the organ that makes insulin.   Not eating enough, or not eating for long periods at a time (fasting).   Severe infection or illness that affects the liver, heart, or kidneys.   Certain medicines.    You may also have reactive hypoglycemia. This condition causes hypoglycemia within 4 hours of eating a meal. This may occur after having stomach surgery. Sometimes, the cause of reactive hypoglycemia is not known.  What increases the risk?  Hypoglycemia is more likely to develop in:   People who have diabetes and take medicines to lower blood glucose.   People who abuse alcohol.   People who have a severe illness.    What are the signs or symptoms?  Hypoglycemia may not cause any symptoms. If you have symptoms, they may include:   Hunger.   Anxiety.   Sweating and feeling clammy.   Confusion.   Dizziness or feeling light-headed.   Sleepiness.   Nausea.   Increased heart rate.   Headache.   Blurry  vision.   Seizure.   Nightmares.   Tingling or numbness around the mouth, lips, or tongue.   A change in speech.   Decreased ability to concentrate.   A change in coordination.   Restless sleep.   Tremors or shakes.   Fainting.   Irritability.    How is this diagnosed?  Hypoglycemia is diagnosed with a blood test to measure your blood glucose level. This blood test is done while you are having symptoms. Your health care provider may also do a physical exam and review your medical history.  If you do not have diabetes, other tests may be done to find the cause of your hypoglycemia.  How is this treated?  This condition can often be treated by immediately eating or drinking something that contains glucose, such as:   3-4 sugar tablets (glucose pills).   Glucose gel, 15-gram tube.   Fruit juice, 4 oz (120 mL).   Regular soda (not diet soda), 4 oz (120 mL).   Low-fat milk, 4 oz (120 mL).   Several pieces of hard candy.   Sugar or honey, 1 Tbsp.    Treating Hypoglycemia If You Have Diabetes    If you are alert and able to swallow safely, follow the 15:15 rule:   Take 15 grams of a rapid-acting carbohydrate. Rapid-acting options include:  ? 1 tube of glucose gel.  ? 3 glucose pills.  ? 6-8 pieces of hard candy.  ?   4 oz (120 mL) of fruit juice.  ? 4 oz (120 ml) of regular (not diet) soda.   Check your blood glucose 15 minutes after you take the carbohydrate.   If the repeat blood glucose level is still at or below 70 mg/dL (3.9 mmol/L), take 15 grams of a carbohydrate again.   If your blood glucose level does not increase above 70 mg/dL (3.9 mmol/L) after 3 tries, seek emergency medical care.   After your blood glucose level returns to normal, eat a meal or a snack within 1 hour.    Treating Severe Hypoglycemia  Severe hypoglycemia is when your blood glucose level is at or below 54 mg/dL (3 mmol/L). Severe hypoglycemia is an emergency. Do not wait to see if the symptoms will go away. Get medical help  right away. Call your local emergency services (911 in the [place] Do not drive yourself to the hospital.  If you have severe hypoglycemia and you cannot eat or drink, you may need an injection of glucagon. A family member or close friend should learn how to check your blood glucose and how to give you a glucagon injection. Ask your health care provider if you need to have an emergency glucagon injection kit available.  Severe hypoglycemia may need to be treated in a hospital. The treatment may include getting glucose through an IV tube. You may also need treatment for the cause of your hypoglycemia.  Follow these instructions at home:  General instructions   Avoid any diets that cause you to not eat enough food. Talk with your health care provider before you start any new diet.   Take over-the-counter and prescription medicines only as told by your health care provider.   Limit alcohol intake to no more than 1 drink per day for nonpregnant women and 2 drinks per day for men. One drink equals 12 oz of beer, 5 oz of wine, or 1 oz of hard liquor.   Keep all follow-up visits as told by your health care provider. This is important.  If You Have Diabetes:     Make sure you know the symptoms of hypoglycemia.   Always have a rapid-acting carbohydrate snack with you to treat low blood sugar.   Follow your diabetes management plan, as told by your health care provider. Make sure you:  ? Take your medicines as directed.  ? Follow your exercise plan.  ? Follow your meal plan. Eat on time, and do not skip meals.  ? Check your blood glucose as often as directed. Make sure to check your blood glucose before and after exercise. If you exercise longer or in a different way than usual, check your blood glucose more often.  ? Follow your sick day plan whenever you cannot eat or drink normally. Make this plan in advance with your health care provider.   Share your diabetes management plan with people in your workplace, school,  and household.   Check your urine for ketones when you are ill and as told by your health care provider.   Carry a medical alert card or wear medical alert jewelry.  If You Have Reactive Hypoglycemia or Low Blood Sugar From Other Causes:   Monitor your blood glucose as told by your health care provider.   Follow instructions from your health care provider about eating or drinking restrictions.  Contact a health care provider if:   You have problems keeping your blood glucose in your target range.   You have   frequent episodes of hypoglycemia.  Get help right away if:   You continue to have hypoglycemia symptoms after eating or drinking something containing glucose.   Your blood glucose is at or below 54 mg/dL (3 mmol/L).   You have a seizure.   You faint.  These symptoms may represent a serious problem that is an emergency. Do not wait to see if the symptoms will go away. Get medical help right away. Call your local emergency services (911 in the [place] Do not drive yourself to the hospital.  This information is not intended to replace advice given to you by your health care provider. Make sure you discuss any questions you have with your health care provider.  Document Released: 11/15/2005 Document Revised: 04/28/2016 Document Reviewed: 12/19/2015  Elsevier Interactive Patient Education  2018 [employer]

## 2018-07-24 NOTE — Progress Notes (Signed)
   Subjective:    Patient ID: Philip Caldwell, male    DOB: Aug 07, 1966, 52 y.o.   MRN: [id number]  Diabetes  He presents for his follow-up diabetic visit. He has type 2 diabetes mellitus. Current diabetic treatments: metformin, levemir.   Following a diabetic diet and walking every day. Blood sugar in the 200's and 300's. Just started metformin yesterday. Took one dose and it put pt to sleep. Today only took one half tablet.   bp has been back up since dealing with the diabetes.   levemir  Eight in the morning         Review of Systems No headache, no major weight loss or weight gain, no chest pain no back pain abdominal pain no change in bowel habits complete ROS otherwise negative     Objective:   Physical Exam Alert vitals stable, NAD. Blood pressure good on repeat. HEENT normal. Lungs clear. Heart regular rate and rhythm.        Assessment & Plan:  Impression type 2 diabetes.  Status post short hospitalization.  Family appreciated but wish more education.  Been delivered in the hospital setting.  Many questions answered.  General nature of type 2 diabetes discussed.  Long-term complications discussed.  Hypoglycemia discussed.  Proper self-monitoring discussed.  Diet discussed.  Exercise discussed.  Recommend off work for a couple more days.  Backoff monitoring to want to maintain insulin 25 units.  Go ahead and bump up the Metformin to 500 twice a day.  Continue same diet.  Follow-up in 2 weeks.  Call us on the results.  Hospital education discussed.  Continue same dose meds for high blood pressure.  Blood pressure on repeat.  Long-term goals discussed   Greater than 50% of this 40 minute face to face visit was spent in counseling and discussion and coordination of care regarding the above diagnosis/diagnosies

## 2018-08-07 ENCOUNTER — Ambulatory Visit: Payer: 59 | Admitting: Family Medicine

## 2018-08-08 ENCOUNTER — Encounter: Payer: Self-pay | Admitting: Family Medicine

## 2018-08-08 ENCOUNTER — Ambulatory Visit (INDEPENDENT_AMBULATORY_CARE_PROVIDER_SITE_OTHER): Payer: 59 | Admitting: Family Medicine

## 2018-08-08 VITALS — BP 128/86 | Ht 69.0 in | Wt 200.4 lb

## 2018-08-08 DIAGNOSIS — E1165 Type 2 diabetes mellitus with hyperglycemia: Secondary | ICD-10-CM

## 2018-08-08 DIAGNOSIS — I10 Essential (primary) hypertension: Secondary | ICD-10-CM | POA: Diagnosis not present

## 2018-08-08 MED ORDER — LOSARTAN POTASSIUM 50 MG PO TABS: 50.0000 mg | ORAL_TABLET | Freq: Every day | ORAL | 1 refills | Status: DC

## 2018-08-08 NOTE — Progress Notes (Signed)
   Subjective:    Patient ID: Philip Caldwell, male    DOB: 05-30-1966, 52 y.o.   MRN: [id number]  HPI Patient arrives for a follow up on recent diagnosis of diabetes.  Most fasting numbers   83 90 lastweekor so most numbers below 120  104   113   94  111 and 84 off insulin, has done well   Metformin twice per day, no bowe issues  Diet a lot better   Off both b p pills,  Stopped amlodipine first, then stopped losartan            Review of Systems No headache, no major weight loss or weight gain, no chest pain no back pain abdominal pain no change in bowel habits complete ROS otherwise negative     Objective:   Physical Exam  Alert vitals stable, NAD. Blood pressure good on repeat. HEENT normal. Lungs clear. Heart regular rate and rhythm.       Assessment & Plan:  1 impression type 2 diabetes.  Control much improved.  Now off insulin.  Sugars still in great control.  Has changed diet and exercise radically.  2.  Hypertension.  Stop both medications.  Discussed.  Would encourage resuming losartan at lower dose.  Rationale  Follow-up in several months.  Check sugar 3 times per week.  Call if numbers begin to rise considerably.

## 2018-08-11 DIAGNOSIS — E119 Type 2 diabetes mellitus without complications: Secondary | ICD-10-CM | POA: Diagnosis not present

## 2018-08-23 ENCOUNTER — Telehealth: Payer: Self-pay | Admitting: Family Medicine

## 2018-08-23 NOTE — Telephone Encounter (Signed)
Only certain batches were contaminated  and those have been removed from ciruclation, he can ask his pharmacy and find out same answer, stay on the med

## 2018-08-23 NOTE — Telephone Encounter (Signed)
Contacted patient and informed him that only certain batches were contaminated and they have been taken out of circulation. Informed patient to stay on med. Pt verbalized understanding.

## 2018-08-23 NOTE — Telephone Encounter (Signed)
Pt worried about taking his losartan (COZAAR) 50 MG tablet due to the recall  Please advise & call pt     [facility]

## 2018-10-15 ENCOUNTER — Other Ambulatory Visit: Payer: Self-pay | Admitting: Family Medicine

## 2018-11-06 ENCOUNTER — Ambulatory Visit (INDEPENDENT_AMBULATORY_CARE_PROVIDER_SITE_OTHER): Payer: 59 | Admitting: Family Medicine

## 2018-11-06 ENCOUNTER — Encounter: Payer: Self-pay | Admitting: Family Medicine

## 2018-11-06 VITALS — BP 140/90 | Ht 69.0 in | Wt 204.8 lb

## 2018-11-06 DIAGNOSIS — E1165 Type 2 diabetes mellitus with hyperglycemia: Secondary | ICD-10-CM

## 2018-11-06 DIAGNOSIS — Z23 Encounter for immunization: Secondary | ICD-10-CM

## 2018-11-06 DIAGNOSIS — R21 Rash and other nonspecific skin eruption: Secondary | ICD-10-CM | POA: Diagnosis not present

## 2018-11-06 DIAGNOSIS — N183 Chronic kidney disease, stage 3 unspecified: Secondary | ICD-10-CM | POA: Insufficient documentation

## 2018-11-06 DIAGNOSIS — I10 Essential (primary) hypertension: Secondary | ICD-10-CM

## 2018-11-06 DIAGNOSIS — E1122 Type 2 diabetes mellitus with diabetic chronic kidney disease: Secondary | ICD-10-CM | POA: Insufficient documentation

## 2018-11-06 DIAGNOSIS — E119 Type 2 diabetes mellitus without complications: Secondary | ICD-10-CM | POA: Diagnosis not present

## 2018-11-06 LAB — POCT GLYCOSYLATED HEMOGLOBIN (HGB A1C): HEMOGLOBIN A1C: 7.3 % — AB (ref 4.0–5.6)

## 2018-11-06 MED ORDER — METFORMIN HCL 500 MG PO TABS: 500.0000 mg | ORAL_TABLET | Freq: Two times a day (BID) | ORAL | 1 refills | Status: DC

## 2018-11-06 MED ORDER — KETOCONAZOLE 2 % EX CREA: TOPICAL_CREAM | CUTANEOUS | 3 refills | Status: DC

## 2018-11-06 MED ORDER — LOSARTAN POTASSIUM 50 MG PO TABS: 50.0000 mg | ORAL_TABLET | Freq: Every day | ORAL | 1 refills | Status: DC

## 2018-11-06 NOTE — Progress Notes (Signed)
   Subjective:    Patient ID: Philip Caldwell, male    DOB: [birth year], 52 y.o.   MRN: [id number]  58   Diabetes  He presents for his follow-up diabetic visit. He has type 2 diabetes mellitus. There are no hypoglycemic associated symptoms. (Skin peeling between left pinky toe. Started about 2 weeks ago. Has used TAO. ) There are no hypoglycemic complications. There are no diabetic complications. Risk factors for coronary artery disease include hypertension and diabetes mellitus. Current diabetic treatment includes oral agent (monotherapy) (metformin). He does not see a podiatrist.Eye exam is current.   Results for orders placed or performed in visit on 11/06/18  POCT glycosylated hemoglobin (Hb A1C)  Result Value Ref Range   Hemoglobin A1C 7.3 (A) 4.0 - 5.6 %   HbA1c POC (<> result, manual entry)     HbA1c, POC (prediabetic range)     HbA1c, POC (controlled diabetic range)     Blood pressure medicine and blood pressure levels reviewed today with patient. Compliant with blood pressure medicine. States does not miss a dose. No obvious side effects. Blood pressure generally good when checked elsewhere. Watching salt intake.   Patient claims compliance with diabetes medication. No obvious side effects. Reports no substantial low sugar spells. Most numbers are generally in good range when checked fasting. Generally does not miss a dose of medication. Watching diabetic diet closely  Glu overall bette, fbs 88 this morn most morn around 100 or 115   Notes rash on foot.  Several weeks duration.  Blistering and scaly.  Between the fourth and fifth toe  exrcising five d per [facility]  Review of Systems No headache, no major weight loss or weight gain, no chest pain no back pain abdominal pain no change in bowel habits complete ROS otherwise negative     Objective:   Physical Exam  Alert vitals stable, NAD. Blood pressure good on repeat. HEENT normal. Lungs clear. Heart regular rate and  rhythm. Alert and oriented, vitals reviewed and stable, NAD ENT-TM's and ext canals WNL bilat via otoscopic exam Soft palate, tonsils and post pharynx WNL via oropharyngeal exam Neck-symmetric, no masses; thyroid nonpalpable and nontender Pulmonary-no tachypnea or accessory muscle use; Clear without wheezes via auscultation Card--no abnrml murmurs, rhythm reg and rate WNL Carotid pulses symmetric, without bruits Left foot intertrigo rash      Assessment & Plan:  Impression 1 type 2 diabetes much improved to maintain same  2.  Hypertension good control discussed  3.  Rash.  With location.  Appearance and onset likely tenia pedis.  Discussed.  Will initiate topical therapy.  Ketoconazole take a while rationale discussed.  Follow-up in 6 months diet exercise discussed.  Flu shot

## 2018-11-26 ENCOUNTER — Telehealth: Payer: Self-pay | Admitting: Family Medicine

## 2018-11-27 ENCOUNTER — Other Ambulatory Visit: Payer: Self-pay | Admitting: *Deleted

## 2018-11-27 MED ORDER — BLOOD GLUCOSE MONITOR KIT: PACK | 1 refills | Status: DC

## 2018-11-27 NOTE — Telephone Encounter (Signed)
Called and pt wanted whole glucose kit and lancets and strips sent to [facility]. Refills sent per dr Richardson Landry. Pt notified.

## 2018-11-27 NOTE — Telephone Encounter (Signed)
Pt is also needing test strips sent to pharmacy as well for the accu check abiba plus

## 2018-12-11 ENCOUNTER — Ambulatory Visit (INDEPENDENT_AMBULATORY_CARE_PROVIDER_SITE_OTHER): Payer: 59 | Admitting: Family Medicine

## 2018-12-11 ENCOUNTER — Encounter: Payer: Self-pay | Admitting: Family Medicine

## 2018-12-11 VITALS — BP 130/92 | Temp 99.4°F | Wt 208.0 lb

## 2018-12-11 DIAGNOSIS — J329 Chronic sinusitis, unspecified: Secondary | ICD-10-CM

## 2018-12-11 MED ORDER — CLARITHROMYCIN 500 MG PO TABS: 500.0000 mg | ORAL_TABLET | Freq: Two times a day (BID) | ORAL | 0 refills | Status: DC

## 2018-12-11 MED ORDER — BENZONATATE 100 MG PO CAPS: 100.0000 mg | ORAL_CAPSULE | Freq: Three times a day (TID) | ORAL | 0 refills | Status: DC | PRN

## 2018-12-11 NOTE — Progress Notes (Signed)
   Subjective:    Patient ID: Philip Caldwell, male    DOB: 10-31-1966, 52 y.o.   MRN: [id number]  Cough  This is a new problem. The current episode started in the past 7 days. The cough is productive of sputum. Associated symptoms comments: Fever of 100.0; congestion. The treatment provided mild (Robitussin and Advil, Benzonate) relief.   PT NOT ACHING N FEVER   BUT HAS coughH clear phlegmad really ba portable douch   temp actually measured.  Felt warm wtarted going up lat night    Her refill on overall today's first like energy levelfels good with energy leel   28 gets a cough stuff up and cannot cleargcoh prod in nature clear whitish mnot yell ow    Good call cannot stop reall  eally bad cough   No heaches  Review of Systems  Respiratory: Positive for cough.        Objective:   Physical Exam  Alert active no major distress.  Positive nasal congestion.  HEENT otherwise normal pharynx normal lungs bronchial cough heart recommended.      Assessment & Plan:  Impression post viral rhinosinusitis/bronchitis.  Recent exposure to flu.  Some the symptoms may well be the flu discussed with patient.  Antibiotics prescribed.  Symptom care discussed.

## 2019-02-14 ENCOUNTER — Telehealth: Payer: Self-pay | Admitting: Family Medicine

## 2019-02-14 MED ORDER — LOSARTAN POTASSIUM 50 MG PO TABS: 50.0000 mg | ORAL_TABLET | Freq: Every day | ORAL | 0 refills | Status: DC

## 2019-02-14 NOTE — Telephone Encounter (Signed)
Requesting refill for   losartan (COZAAR) 50 MG tablet   Pharmacy:  [id number] [place], [place]

## 2019-02-14 NOTE — Telephone Encounter (Signed)
Prescription sent electronically to pharmacy. Patient notified. 

## 2019-04-30 ENCOUNTER — Encounter: Payer: 59 | Admitting: Family Medicine

## 2019-05-08 ENCOUNTER — Other Ambulatory Visit: Payer: Self-pay | Admitting: Family Medicine

## 2019-05-08 NOTE — Telephone Encounter (Signed)
Please call and schedule visit and then let nurses know so we can send in refill. thanks

## 2019-05-08 NOTE — Telephone Encounter (Signed)
APPT. Made for June 11th as a phone visit.

## 2019-05-10 ENCOUNTER — Ambulatory Visit: Payer: 59 | Admitting: Family Medicine

## 2019-05-31 ENCOUNTER — Other Ambulatory Visit: Payer: Self-pay | Admitting: Family Medicine

## 2019-06-27 ENCOUNTER — Other Ambulatory Visit: Payer: Self-pay | Admitting: Family Medicine

## 2019-06-28 NOTE — Telephone Encounter (Signed)
Please schedule then let nurses know to send in med

## 2019-06-28 NOTE — Telephone Encounter (Signed)
Ok times one, sched six mo f u plz

## 2019-06-29 NOTE — Telephone Encounter (Signed)
Pt also has diabetes, no diab visit since December, should be seen every six mo, refill two weeks worth and rec f u visit for htn and diab

## 2019-06-29 NOTE — Telephone Encounter (Signed)
Patient refused to schedule follow up appointment to get his refill.

## 2019-08-01 ENCOUNTER — Other Ambulatory Visit: Payer: Self-pay | Admitting: Family Medicine

## 2019-08-01 NOTE — Telephone Encounter (Signed)
Please contact patient and have pt set up office/virtual visit;then may route back to nurses. Thank you

## 2019-08-02 NOTE — Telephone Encounter (Signed)
[facility] - need to schedule visit  °

## 2019-08-23 ENCOUNTER — Telehealth: Payer: Self-pay | Admitting: Family Medicine

## 2019-08-23 DIAGNOSIS — I10 Essential (primary) hypertension: Secondary | ICD-10-CM

## 2019-08-23 DIAGNOSIS — Z79899 Other long term (current) drug therapy: Secondary | ICD-10-CM

## 2019-08-23 DIAGNOSIS — E119 Type 2 diabetes mellitus without complications: Secondary | ICD-10-CM

## 2019-08-23 NOTE — Telephone Encounter (Signed)
Pt contacted. Pt states that he has been non compliant with diet but since Sunday, he has tried to do better with diet. Pt states that he had been feeling so well that he did not take his sugars daily. Sunday he began to feel sick and that's when he checked his sugars. Every since Sunday he has been taking his sugars around the clock. Most recent reading is 232. Pt states he takes his medication daily as prescribed. Pt does not have an exercise routine but does do a lot of walking at job. Pt states he does not mind going to [facility] to have blood work done. Pt has not had any recent illnesses. Please advise. Thank you.

## 2019-08-23 NOTE — Telephone Encounter (Signed)
PATIENT SCHEDULED FOR 08-31-19

## 2019-08-23 NOTE — Telephone Encounter (Signed)
Lab orders placed and pt is aware. Pt verbalized understanding and will keep appt with Dr.Steve next week

## 2019-08-23 NOTE — Telephone Encounter (Signed)
Pt contacted office and stated that since Sunday his blood sugars have been in the 400s. Pt states that this morning when he first woke up his sugar was 212. He has eaten a bowel of cheerios and drank some water this morning. Pt states he took his blood sugar about 10 minutes before calling and sugar was up to 353. Pt states that when his sugars get over 300, he begins to feel dizzy. Pt is taking Metformin 500 mg BID.  Pt has scheduled a visit for 08/31/2019 to get refills on blood pressure medications. Please advise. Thank you

## 2019-08-23 NOTE — Telephone Encounter (Signed)
Left message to return call 

## 2019-08-23 NOTE — Telephone Encounter (Signed)
The patient may go to [facility] this afternoon He does not have to be fasting Lipid, liver, metabolic 7, urine ACR, [id number]  Let the patient know we are doing a urine and blood work If he could do this today there is a good chance we will have some results tomorrow It is also important for the patient is set up by visit with Dr. Richardson Landry next week either in person or virtual Once we have the lab work back we may be adjusting his medicines  Healthy eating minimizing starches very important to stay away from sugary drinks and sugary snacks

## 2019-08-23 NOTE — Addendum Note (Signed)
Addended by: Vicente Males on: 08/23/2019 02:26 PM   Modules accepted: Orders

## 2019-08-23 NOTE — Telephone Encounter (Signed)
So Several questions at this point- How well has patient been able to follow a low starch diet? How compliant has he been with his medicines? Has anything gone on recently that caused her sugars to go up?  Possibly increase starches, noncompliance with medicine, recent illness?  Is patient willing to go do lab work? (If possible we could do some lab work today and then do a follow-up virtual visit tomorrow or in person visit, otherwise this will be doing lab work with him doing follow-up with Dr. Richardson Landry next week)

## 2019-08-24 LAB — HEPATIC FUNCTION PANEL
ALT: 99 IU/L — ABNORMAL HIGH (ref 0–44)
AST: 51 IU/L — ABNORMAL HIGH (ref 0–40)
Albumin: 4.5 g/dL (ref 3.8–4.9)
Alkaline Phosphatase: 97 IU/L (ref 39–117)
Bilirubin Total: 0.4 mg/dL (ref 0.0–1.2)
Bilirubin, Direct: 0.13 mg/dL (ref 0.00–0.40)
Total Protein: 7.1 g/dL (ref 6.0–8.5)

## 2019-08-24 LAB — BASIC METABOLIC PANEL
BUN/Creatinine Ratio: 11 (ref 9–20)
BUN: 15 mg/dL (ref 6–24)
CO2: 25 mmol/L (ref 20–29)
Calcium: 9.6 mg/dL (ref 8.7–10.2)
Chloride: 98 mmol/L (ref 96–106)
Creatinine, Ser: 1.36 mg/dL — ABNORMAL HIGH (ref 0.76–1.27)
GFR calc Af Amer: 67 mL/min/{1.73_m2} (ref >59)
GFR calc non Af Amer: 58 mL/min/{1.73_m2} — ABNORMAL LOW (ref >59)
Glucose: 226 mg/dL — ABNORMAL HIGH (ref 65–99)
Potassium: 4.3 mmol/L (ref 3.5–5.2)
Sodium: 137 mmol/L (ref 134–144)

## 2019-08-24 LAB — MICROALBUMIN / CREATININE URINE RATIO
Creatinine, Urine: 300.1 mg/dL
Microalb/Creat Ratio: 9 mg/g creat (ref 0–29)
Microalbumin, Urine: 27.1 ug/mL

## 2019-08-24 LAB — LIPID PANEL
Chol/HDL Ratio: 4.7 ratio (ref 0.0–5.0)
Cholesterol, Total: 206 mg/dL — ABNORMAL HIGH (ref 100–199)
HDL: 44 mg/dL (ref >39)
LDL Chol Calc (NIH): 98 mg/dL (ref 0–99)
Triglycerides: 381 mg/dL — ABNORMAL HIGH (ref 0–149)
VLDL Cholesterol Cal: 64 mg/dL — ABNORMAL HIGH (ref 5–40)

## 2019-08-24 LAB — HEMOGLOBIN A1C
Est. average glucose Bld gHb Est-mCnc: 189 mg/dL
Hgb A1c MFr Bld: 8.2 % — ABNORMAL HIGH (ref 4.8–5.6)

## 2019-08-27 MED ORDER — METFORMIN HCL 500 MG PO TABS: 500.0000 mg | ORAL_TABLET | Freq: Two times a day (BID) | ORAL | 0 refills | Status: DC

## 2019-08-27 NOTE — Addendum Note (Signed)
Addended by: Vicente Males on: 08/27/2019 03:15 PM   Modules accepted: Orders

## 2019-08-29 ENCOUNTER — Telehealth: Payer: Self-pay | Admitting: Family Medicine

## 2019-08-29 ENCOUNTER — Other Ambulatory Visit: Payer: Self-pay | Admitting: *Deleted

## 2019-08-29 MED ORDER — LOSARTAN POTASSIUM 25 MG PO TABS: ORAL_TABLET | ORAL | 0 refills | Status: DC

## 2019-08-29 NOTE — Telephone Encounter (Signed)
Pt needs refill on Losartan, he's out & has virtual visit scheduled for 08/31/2019   Please advise & call pt      [facility]

## 2019-08-29 NOTE — Telephone Encounter (Signed)
Refill sent to pharm per dr Richardson Landry. Pt notified on voicemail.

## 2019-08-31 ENCOUNTER — Ambulatory Visit (INDEPENDENT_AMBULATORY_CARE_PROVIDER_SITE_OTHER): Payer: 59 | Admitting: Family Medicine

## 2019-08-31 DIAGNOSIS — E119 Type 2 diabetes mellitus without complications: Secondary | ICD-10-CM

## 2019-08-31 DIAGNOSIS — I10 Essential (primary) hypertension: Secondary | ICD-10-CM

## 2019-08-31 DIAGNOSIS — Z125 Encounter for screening for malignant neoplasm of prostate: Secondary | ICD-10-CM

## 2019-08-31 DIAGNOSIS — Z79899 Other long term (current) drug therapy: Secondary | ICD-10-CM | POA: Diagnosis not present

## 2019-08-31 MED ORDER — METFORMIN HCL 500 MG PO TABS: 1000.0000 mg | ORAL_TABLET | Freq: Two times a day (BID) | ORAL | 1 refills | Status: DC

## 2019-08-31 MED ORDER — LOSARTAN POTASSIUM 25 MG PO TABS: ORAL_TABLET | ORAL | 5 refills | Status: DC

## 2019-08-31 NOTE — Progress Notes (Addendum)
Subjective:  Audio plus video  Patient ID: Philip Caldwell, male    DOB: May 02, 1966, 53 y.o.   MRN: [id number]  Diabetes He presents for his follow-up diabetic visit. He has type 2 diabetes mellitus. Risk factors for coronary artery disease include hypertension. Current diabetic treatment includes oral agent (monotherapy). He is compliant with treatment all of the time. His weight is stable. He is following a diabetic diet.   hgbA1c 8.2 on 08/23/19 Discuss recent labs Results for orders placed or performed in visit on 08/23/19  Lipid Profile  Result Value Ref Range   Cholesterol, Total 206 (H) 100 - 199 mg/dL   Triglycerides 381 (H) 0 - 149 mg/dL   HDL 44 >39 mg/dL   VLDL Cholesterol Cal 64 (H) 5 - 40 mg/dL   LDL Chol Calc (NIH) 98 0 - 99 mg/dL   Chol/HDL Ratio 4.7 0.0 - 5.0 ratio  Hepatic function panel  Result Value Ref Range   Total Protein 7.1 6.0 - 8.5 g/dL   Albumin 4.5 3.8 - 4.9 g/dL   Bilirubin Total 0.4 0.0 - 1.2 mg/dL   Bilirubin, Direct 0.13 0.00 - 0.40 mg/dL   Alkaline Phosphatase 97 39 - 117 IU/L   AST 51 (H) 0 - 40 IU/L   ALT 99 (H) 0 - 44 IU/L  Basic metabolic panel  Result Value Ref Range   Glucose 226 (H) 65 - 99 mg/dL   BUN 15 6 - 24 mg/dL   Creatinine, Ser 1.36 (H) 0.76 - 1.27 mg/dL   GFR calc non Af Amer 58 (L) >59 mL/min/1.73   GFR calc Af Amer 67 >59 mL/min/1.73   BUN/Creatinine Ratio 11 9 - 20   Sodium 137 134 - 144 mmol/L   Potassium 4.3 3.5 - 5.2 mmol/L   Chloride 98 96 - 106 mmol/L   CO2 25 20 - 29 mmol/L   Calcium 9.6 8.7 - 10.2 mg/dL  Hemoglobin A1c  Result Value Ref Range   Hgb A1c MFr Bld 8.2 (H) 4.8 - 5.6 %   Est. average glucose Bld gHb Est-mCnc 189 mg/dL  Urine Microalbumin w/creat. ratio  Result Value Ref Range   Creatinine, Urine 300.1 Not Estab. mg/dL   Microalbumin, Urine 27.1 Not Estab. ug/mL   Microalb/Creat Ratio 9 0 - 29 mg/g creat     Review of Systems Virtual Visit via Video Note  I connected with Philip Caldwell on  08/31/19 at  3:00 PM EDT by a video enabled telemedicine application and verified that I am speaking with the correct person using two identifiers.  Location: Patient: home Provider: office   I discussed the limitations of evaluation and management by telemedicine and the availability of in person appointments. The patient expressed understanding and agreed to proceed.  History of Present Illness:    Observations/Objective:   Assessment and Plan:   Follow Up Instructions:    I discussed the assessment and treatment plan with the patient. The patient was provided an opportunity to ask questions and all were answered. The patient agreed with the plan and demonstrated an understanding of the instructions.   The patient was advised to call back or seek an in-person evaluation if the symptoms worsen or if the condition fails to improve as anticipated.  I provided 20 minutes of non-face-to-face time during this encounter.  Blood pressure medicine and blood pressure levels reviewed today with patient. Compliant with blood pressure medicine. States does not miss a dose. No obvious side effects. Blood  pressure generally good when checked elsewhere. Watching salt intake.        Objective:   Physical Exam  Virtual      Assessment & Plan:  Impression 1 hypertension.  Apparent good control patient maintain same meds  2.  Type 2 diabetes.  Suboptimal control discussed on numbers reviewed.  Increase metformin to 1-2 twice daily.  Sugars should be under 150 and 30 days none under 130 and 60 days if not call us  3.  Elevated liver enzymes  Diet exercise discussed following encouraged.  Follow-up in 3 months to 4 months blood work 1 week prior rationale discussed

## 2019-09-01 ENCOUNTER — Encounter: Payer: Self-pay | Admitting: Family Medicine

## 2019-09-07 ENCOUNTER — Other Ambulatory Visit: Payer: Self-pay

## 2019-09-07 ENCOUNTER — Other Ambulatory Visit (INDEPENDENT_AMBULATORY_CARE_PROVIDER_SITE_OTHER): Payer: 59 | Admitting: *Deleted

## 2019-09-07 DIAGNOSIS — Z23 Encounter for immunization: Secondary | ICD-10-CM

## 2019-09-10 ENCOUNTER — Telehealth: Payer: Self-pay | Admitting: Family Medicine

## 2019-09-10 DIAGNOSIS — M25519 Pain in unspecified shoulder: Secondary | ICD-10-CM

## 2019-09-10 DIAGNOSIS — M79622 Pain in left upper arm: Secondary | ICD-10-CM

## 2019-09-10 NOTE — Telephone Encounter (Signed)
Left arm and shoulder pain again, would like to be referred back to the doctor he saw a few years ago.  Dr. Ace Gins in [place].  I told patient there may be a delay in getting a return phone call.

## 2019-09-10 NOTE — Telephone Encounter (Signed)
sure

## 2019-09-10 NOTE — Telephone Encounter (Signed)
Referral placed and pt is aware. Pt verbalized understanding 

## 2019-09-10 NOTE — Telephone Encounter (Signed)
Please advise. Thank you

## 2019-10-15 ENCOUNTER — Other Ambulatory Visit: Payer: Self-pay | Admitting: Family Medicine

## 2019-11-29 ENCOUNTER — Other Ambulatory Visit: Payer: 59

## 2019-12-06 ENCOUNTER — Other Ambulatory Visit: Payer: Self-pay

## 2019-12-06 ENCOUNTER — Ambulatory Visit
Admission: EM | Admit: 2019-12-06 | Discharge: 2019-12-06 | Disposition: A | Discharge status: Home / Self Care | Payer: 59 | Attending: Emergency Medicine | Admitting: Emergency Medicine

## 2019-12-06 ENCOUNTER — Ambulatory Visit (INDEPENDENT_AMBULATORY_CARE_PROVIDER_SITE_OTHER): Payer: 59

## 2019-12-06 DIAGNOSIS — R509 Fever, unspecified: Secondary | ICD-10-CM

## 2019-12-06 DIAGNOSIS — R0602 Shortness of breath: Secondary | ICD-10-CM

## 2019-12-06 DIAGNOSIS — J189 Pneumonia, unspecified organism: Secondary | ICD-10-CM

## 2019-12-06 LAB — POC SARS CORONAVIRUS 2 AG -  ED: SARS Coronavirus 2 Ag: NEGATIVE

## 2019-12-06 MED ORDER — ACETAMINOPHEN 325 MG PO TABS
650.0000 mg | ORAL_TABLET | Freq: Once | ORAL | Status: AC
  Administered 2019-12-06: 650 mg via ORAL

## 2019-12-06 MED ORDER — AZITHROMYCIN 250 MG PO TABS: 250.0000 mg | ORAL_TABLET | Freq: Every day | ORAL | 0 refills | Status: DC

## 2019-12-06 NOTE — ED Triage Notes (Signed)
Pt presents to UC w/ c/o fever, diarrhea, feeling weak, decreased appetite 7 days ago, but worsening symptoms 3 days ago.  Covid 6 days ago negative

## 2019-12-06 NOTE — Discharge Instructions (Addendum)
X-rays showed a pneumonia Get plenty of rest and push fluids Use OTC medications as needed for symptomatic relief of fever and body aches Azithromycin prescribed.  Take as prescribed and to completion.   Take antibiotic as directed and to completion Follow up with PCP if symptoms persist Return or go to ER if you have any new or worsening symptoms

## 2019-12-06 NOTE — ED Provider Notes (Signed)
[facility]    CSN: [id number] Arrival date & time: 12/06/19  1605      History   Chief Complaint Chief Complaint  Patient presents with  . decreased appetite, fatigue, cough    HPI Philip Caldwell is a 54 y.o. male.   Philip Caldwell 54 years old male presented to the urgent care with a complaint of fever, chills, diarrhea, weakness and decreased appetite for the past 7 days.  Denies sick exposure to COVID, flu or strep.  Denies recent travel.  Denies aggravating or alleviating symptoms.  Denies previous COVID infection.   Denies  fatigue, nasal congestion, rhinorrhea, sore throat, cough, SOB, wheezing, chest pain, nausea, vomiting, ch  The history is provided by the patient. No language interpreter was used.    Past Medical History:  Diagnosis Date  . A-fib ([place])   . Arm pain, central    with intermittent numbness  . Hypertension   . Neck pain   . Tendonitis     Patient Active Problem List   Diagnosis Date Noted  . Type 2 diabetes mellitus without complication, without long-term current use of insulin ([place]) 11/06/2018  . Dehydration 07/21/2018  . Hyponatremia 07/21/2018  . AKI (acute kidney injury) ([place]) 07/21/2018  . Essential hypertension 10/14/2014  . Chronic arthralgias of knees and hips 10/14/2014  . SHOULDER PAIN 07/15/2008  . IMPINGEMENT SYNDROME 07/15/2008  . [place] HEAD 07/15/2008    Past Surgical History:  Procedure Laterality Date  . COLONOSCOPY N/A 10/08/2015   Procedure: COLONOSCOPY;  Surgeon: Rogene Houston, MD;  Location: [facility];  Service: Endoscopy;  Laterality: N/A;  1030  . NERVE SURGERY         Home Medications    Prior to Admission medications   Medication Sig Start Date End Date Taking? Authorizing Provider  Accu-Chek Softclix Lancets lancets USE TO OBTAIN A BLOOD SPECIMEN TO TEST BLOOD SUGAR UP TO 4 TIMES A DAY 10/15/19   [name], MD  azithromycin (ZITHROMAX) 250 MG tablet Take 1 tablet (250 mg  total) by mouth daily. Take first 2 tablets together, then 1 every day until finished. 12/06/19   Raven Harmes, Darrelyn Hillock, FNP  blood glucose meter kit and supplies KIT Dispense based on patient and insurance preference. Use up to four times daily as directed. (FOR ICD-9 250.00, 250.01). 07/22/18   Kathie Dike, MD  blood glucose meter kit and supplies KIT Dispense based on patient and insurance preference. Use up to test glucose daily.  (FOR ICD - 10) 11/27/18   [name], MD  ketoconazole (NIZORAL) 2 % cream Apply twice daily to affected area Patient not taking: Reported on 12/11/2018 11/06/18   [name], MD  losartan (COZAAR) 25 MG tablet TAKE 2 TABLETS (50 MG) BY MOUTH EVERY DAY 08/31/19   [name], MD  metFORMIN (GLUCOPHAGE) 500 MG tablet Take 2 tablets (1,000 mg total) by mouth 2 (two) times daily with a meal. 08/31/19   [name], MD    Family History Family History  Problem Relation Age of Onset  . Dementia Mother   . Cancer Father   . Heart attack Brother     Social History Social History   Tobacco Use  . Smoking status: Never Smoker  . Smokeless tobacco: Never Used  Substance Use Topics  . Alcohol use: No  . Drug use: No     Allergies   Lisinopril and Penicillins   Review of Systems Review of  Systems  Constitutional: Positive for chills, fatigue and fever.  HENT: Negative.   Respiratory: Negative.   Cardiovascular: Negative.   Gastrointestinal: Positive for diarrhea.  Neurological: Negative.   ROS: All other negative  Physical Exam Triage Vital Signs ED Triage Vitals  Enc Vitals Group     BP 12/06/19 1620 111/65     Pulse Rate 12/06/19 1620 (!) 112     Resp 12/06/19 1620 18     Temp 12/06/19 1620 (!) 101.1 F (38.4 C)     Temp Source 12/06/19 1620 Oral     SpO2 12/06/19 1620 90 %     Weight --      Height --      Head Circumference --      Peak Flow --      Pain Score 12/06/19 1618 0     Pain Loc --      Pain Edu? --       Excl. in [place]? --    No data found.  Updated Vital Signs BP 111/65 (BP Location: Right Arm)   Pulse (!) 112   Temp (!) 101.1 F (38.4 C) (Oral)   Resp 18   SpO2 90%   Visual Acuity Right Eye Distance:   Left Eye Distance:   Bilateral Distance:    Right Eye Near:   Left Eye Near:    Bilateral Near:     Physical Exam Vitals and nursing note reviewed.  Constitutional:      General: He is not in acute distress.    Appearance: Normal appearance. He is normal weight. He is not ill-appearing or toxic-appearing.  HENT:     Head: Normocephalic.     Right Ear: Tympanic membrane, ear canal and external ear normal. There is no impacted cerumen.     Left Ear: Tympanic membrane, ear canal and external ear normal. There is no impacted cerumen.     Nose: Nose normal. No congestion.     Mouth/Throat:     Mouth: Mucous membranes are moist.     Pharynx: No oropharyngeal exudate or posterior oropharyngeal erythema.  Cardiovascular:     Rate and Rhythm: Normal rate and regular rhythm.     Pulses: Normal pulses.     Heart sounds: Normal heart sounds. No murmur.  Pulmonary:     Effort: Pulmonary effort is normal. No respiratory distress.     Breath sounds: Normal breath sounds. No wheezing or rhonchi.  Chest:     Chest wall: No tenderness.  Abdominal:     General: Abdomen is flat. Bowel sounds are normal. There is no distension.     Palpations: There is no mass.  Skin:    Capillary Refill: Capillary refill takes less than 2 seconds.  Neurological:     Mental Status: He is alert and oriented to person, place, and time.      UC Treatments / Results  Labs (all labs ordered are listed, but only abnormal results are displayed) Labs Reviewed  POC SARS CORONAVIRUS 2 AG -  ED    EKG   Radiology DG Chest 2 View  Result Date: 12/06/2019 CLINICAL DATA:  Cough for 3 days, shortness of breath, fever, nausea, history of pneumonia, hypertension, type II diabetes mellitus EXAM: CHEST - 2  VIEW COMPARISON:  06/10/2012 FINDINGS: Borderline enlargement of cardiac silhouette. Mediastinal contours and pulmonary vascularity normal. Patchy infiltrates in both lungs consistent with multifocal pneumonia, greater on RIGHT. No pleural effusion or pneumothorax. Bones unremarkable. IMPRESSION: BILATERAL pulmonary infiltrates  RIGHT greater than LEFT consistent with multifocal pneumonia. Electronically Signed   By: Lavonia Dana M.D.   On: 12/06/2019 17:08    Procedures Procedures (including critical care time)  Medications Ordered in UC Medications  acetaminophen (TYLENOL) tablet 650 mg (650 mg Oral Given 12/06/19 1630)    Initial Impression / Assessment and Plan / UC Course  I have reviewed the triage vital signs and the nursing notes.  Pertinent labs & imaging results that were available during my care of the patient were reviewed by me and considered in my medical decision making (see chart for details).   Chest x-ray was ordered and result was reviewed.  Results show infiltrate right lung greater than left. Point-of-care COVID-19 test was ordered and result was reviewed.  Results was negative for COVID-19 infection. Patient stable for discharge and in no acute distress.  Patient will be treated for pneumonia.  To go to ED for worsening symptoms.  Patient verbalized understanding of the plan of care.  Final Clinical Impressions(s) / UC Diagnoses   Final diagnoses:  Pneumonia due to organism  Fever and chills     Discharge Instructions     X-rays showed a pneumonia Get plenty of rest and push fluids Use OTC medications as needed for symptomatic relief of fever and body aches Azithromycin prescribed.  Take as prescribed and to completion.   Take antibiotic as directed and to completion Follow up with PCP if symptoms persist Return or go to ER if you have any new or worsening symptoms   Recommend repeat x-ray in 6 weeks to exclude underlying malignancy      ED Prescriptions      Medication Sig Dispense Auth. Provider   azithromycin (ZITHROMAX) 250 MG tablet Take 1 tablet (250 mg total) by mouth daily. Take first 2 tablets together, then 1 every day until finished. 6 tablet Michale Emmerich, Darrelyn Hillock, FNP     PDMP not reviewed this encounter.   Emerson Monte, [place] 12/06/19 [phone]

## 2019-12-08 ENCOUNTER — Emergency Department (HOSPITAL_COMMUNITY): Admission: EM | Admit: 2019-12-08 | Discharge: 2019-12-08 | Discharge status: Against Medical Advice | Payer: 59

## 2019-12-08 ENCOUNTER — Other Ambulatory Visit: Payer: Self-pay

## 2019-12-08 NOTE — ED Notes (Signed)
Triage tech called pt and pt stated that he was leaving.

## 2019-12-17 ENCOUNTER — Other Ambulatory Visit: Payer: Self-pay | Admitting: Family Medicine

## 2019-12-18 ENCOUNTER — Telehealth: Payer: Self-pay | Admitting: Family Medicine

## 2019-12-18 MED ORDER — GLIPIZIDE 5 MG PO TABS: 5.0000 mg | ORAL_TABLET | Freq: Two times a day (BID) | ORAL | 0 refills | Status: DC

## 2019-12-18 NOTE — Telephone Encounter (Signed)
Prescription was sent electronically to pharmacy. Patient notified and will call back in several days with sugar readings.

## 2019-12-18 NOTE — Telephone Encounter (Signed)
Patient currently on Metformin 500mg  one BID

## 2019-12-18 NOTE — Telephone Encounter (Signed)
Patient states sugar reading have been in the 500 range the pass 3 days today it was 551. He states just called for refill on current medication. Please advise [facility]

## 2019-12-18 NOTE — Telephone Encounter (Signed)
Maintain metformin. Add glipizide five mg p o bid to current regimen get two doses in today by afternoon and bedtime dose,  glipizide normally morn and eve same time as metformin. Call us in several days wih status of sugars. Drink plenty fluids

## 2019-12-19 ENCOUNTER — Other Ambulatory Visit: Payer: Self-pay

## 2019-12-19 ENCOUNTER — Ambulatory Visit (INDEPENDENT_AMBULATORY_CARE_PROVIDER_SITE_OTHER): Payer: 59 | Admitting: Family Medicine

## 2019-12-19 DIAGNOSIS — N200 Calculus of kidney: Secondary | ICD-10-CM

## 2019-12-19 DIAGNOSIS — R109 Unspecified abdominal pain: Secondary | ICD-10-CM | POA: Diagnosis not present

## 2019-12-19 LAB — POCT URINALYSIS DIPSTICK
Glucose, UA: POSITIVE — AB
Protein, UA: POSITIVE — AB
Spec Grav, UA: 1.015 (ref 1.010–1.025)
pH, UA: 6.5 (ref 5.0–8.0)

## 2019-12-19 MED ORDER — HYDROCODONE-ACETAMINOPHEN 5-325 MG PO TABS: ORAL_TABLET | ORAL | 0 refills | Status: DC

## 2019-12-19 NOTE — Progress Notes (Signed)
   Subjective:    Patient ID: Philip Caldwell, male    DOB: 11-27-1966, 54 y.o.   MRN: [id number]  HPI Pt is having side pain above kidneys. Pain started yesterday; EMS called this morning. Paramedics advise pt to take Tylenol for pain and inflammation. Pt states he did and no pain in 2 hours. Pt states that this morning when he went to bathroom, it felt like something was at the tip. Pt has no issues when using the bathroom.  Pt blood sugar 311 when first awake; 378 before coming to office  Results for orders placed or performed in visit on 12/19/19  POCT Urinalysis Dipstick  Result Value Ref Range   Color, UA     Clarity, UA     Glucose, UA Positive (A) Negative   Bilirubin, UA +    Ketones, UA     Spec Grav, UA 1.015 1.010 - 1.025   Blood, UA +    pH, UA 6.5 5.0 - 8.0   Protein, UA Positive (A) Negative   Urobilinogen, UA     Nitrite, UA     Leukocytes, UA     Appearance     Odor     See prior notes.  Patient has known COVID-19 pneumonia.  No recent reports of hypoxemia.  We are past the quarantine phase.  Sudden onset of pain.  Primarily left flank with radiation towards left testicle.  Very severe.  Seen by EMS.  They declined.  Him to the emergency room citing COVID-19 and recommended urgent follow-up with his office Dr.  Patient also his diabetes has worsened lately and therefore more thirst and urination.  Glipizide initiated literally and within the last 24 hours glucose is starting to respond  No history of kidney stone Review of Systems No vomiting no diarrhea no change in bowel habits no rash no current fever    Objective:   Physical Exam  Alert and oriented, vitals reviewed and stable, NAD ENT-TM's and ext canals WNL bilat via otoscopic exam Soft palate, tonsils and post pharynx WNL via oropharyngeal exam Neck-symmetric, no masses; thyroid nonpalpable and nontender Pulmonary-no tachypnea or accessory muscle use; Clear without wheezes via auscultation  Card--no abnrml murmurs, rhythm reg and rate WNL Carotid pulses symmetric, without bruits Left CVA tenderness  Urinalysis numerous red blood cells no white blood cells      Assessment & Plan:  Impression probable kidney stone.  Discussed at great length.  Use ibuprofen.  Warning signs discussed.  Symptom care discussed recommend filtering for a stone.  2.  Type 2 diabetes.  Suboptimal.  New medication discussed  3.  Resolving COVID-19 pneumonia.  Clinically stable and improving  As noted above  Greater than 50% of this 30 minute face to face visit was spent in counseling and discussion and coordination of care regarding the above diagnosis/diagnosies

## 2020-01-02 ENCOUNTER — Encounter: Payer: Self-pay | Admitting: Family Medicine

## 2020-01-09 ENCOUNTER — Other Ambulatory Visit: Payer: Self-pay | Admitting: Family Medicine

## 2020-01-10 ENCOUNTER — Other Ambulatory Visit: Payer: Self-pay | Admitting: Family Medicine

## 2020-01-10 NOTE — Telephone Encounter (Signed)
Script for test strips written out awaiting signature, then will fax to pharmacy

## 2020-01-24 ENCOUNTER — Other Ambulatory Visit: Payer: Self-pay | Admitting: Family Medicine

## 2020-01-27 ENCOUNTER — Ambulatory Visit: Payer: 59 | Attending: Internal Medicine

## 2020-01-27 ENCOUNTER — Ambulatory Visit: Payer: Self-pay

## 2020-01-27 DIAGNOSIS — Z23 Encounter for immunization: Secondary | ICD-10-CM | POA: Insufficient documentation

## 2020-01-27 NOTE — Progress Notes (Signed)
   Covid-19 Vaccination Clinic  Name:  Philip Caldwell    MRN: [id number] DOB: Apr 27, 1966  01/27/2020  [name] was observed post Covid-19 immunization for 15 minutes without incidence. He was provided with Vaccine Information Sheet and instruction to access the V-Safe system.   [name] was instructed to call 911 with any severe reactions post vaccine: Marland Kitchen Difficulty breathing  . Swelling of your face and throat  . A fast heartbeat  . A bad rash all over your body  . Dizziness and weakness    Immunizations Administered    Name Date Dose VIS Date Route   Moderna COVID-19 Vaccine 01/27/2020  1:03 PM 0.5 mL 10/30/2019 Intramuscular   Manufacturer: Moderna   Lot: [id number]   [id number]

## 2020-01-31 ENCOUNTER — Encounter (HOSPITAL_COMMUNITY): Payer: Self-pay | Admitting: Emergency Medicine

## 2020-01-31 ENCOUNTER — Emergency Department (HOSPITAL_COMMUNITY)
Admission: EM | Admit: 2020-01-31 | Discharge: 2020-01-31 | Disposition: A | Discharge status: Home / Self Care | Payer: 59 | Attending: Emergency Medicine | Admitting: Emergency Medicine

## 2020-01-31 ENCOUNTER — Other Ambulatory Visit: Payer: Self-pay

## 2020-01-31 ENCOUNTER — Emergency Department (HOSPITAL_COMMUNITY): Payer: 59

## 2020-01-31 DIAGNOSIS — R7989 Other specified abnormal findings of blood chemistry: Secondary | ICD-10-CM

## 2020-01-31 DIAGNOSIS — R109 Unspecified abdominal pain: Secondary | ICD-10-CM

## 2020-01-31 DIAGNOSIS — R911 Solitary pulmonary nodule: Secondary | ICD-10-CM | POA: Insufficient documentation

## 2020-01-31 DIAGNOSIS — R918 Other nonspecific abnormal finding of lung field: Secondary | ICD-10-CM

## 2020-01-31 DIAGNOSIS — R739 Hyperglycemia, unspecified: Secondary | ICD-10-CM

## 2020-01-31 DIAGNOSIS — N200 Calculus of kidney: Secondary | ICD-10-CM | POA: Diagnosis not present

## 2020-01-31 DIAGNOSIS — R1032 Left lower quadrant pain: Secondary | ICD-10-CM | POA: Diagnosis present

## 2020-01-31 DIAGNOSIS — E1165 Type 2 diabetes mellitus with hyperglycemia: Secondary | ICD-10-CM | POA: Diagnosis not present

## 2020-01-31 DIAGNOSIS — Z7984 Long term (current) use of oral hypoglycemic drugs: Secondary | ICD-10-CM | POA: Diagnosis not present

## 2020-01-31 DIAGNOSIS — R944 Abnormal results of kidney function studies: Secondary | ICD-10-CM | POA: Diagnosis not present

## 2020-01-31 LAB — CBC WITH DIFFERENTIAL/PLATELET
Abs Immature Granulocytes: 0.04 10*3/uL (ref 0.00–0.07)
Basophils Absolute: 0 10*3/uL (ref 0.0–0.1)
Basophils Relative: 0 %
Eosinophils Absolute: 0.4 10*3/uL (ref 0.0–0.5)
Eosinophils Relative: 3 %
HCT: 37.9 % — ABNORMAL LOW (ref 39.0–52.0)
Hemoglobin: 13.8 g/dL (ref 13.0–17.0)
Immature Granulocytes: 0 %
Lymphocytes Relative: 16 %
Lymphs Abs: 1.8 10*3/uL (ref 0.7–4.0)
MCH: 29.1 pg (ref 26.0–34.0)
MCHC: 36.4 g/dL — ABNORMAL HIGH (ref 30.0–36.0)
MCV: 80 fL (ref 80.0–100.0)
Monocytes Absolute: 1.2 10*3/uL — ABNORMAL HIGH (ref 0.1–1.0)
Monocytes Relative: 10 %
Neutro Abs: 8.1 10*3/uL — ABNORMAL HIGH (ref 1.7–7.7)
Neutrophils Relative %: 71 %
Platelets: 311 10*3/uL (ref 150–400)
RBC: 4.74 MIL/uL (ref 4.22–5.81)
RDW: 14.6 % (ref 11.5–15.5)
WBC: 11.5 10*3/uL — ABNORMAL HIGH (ref 4.0–10.5)
nRBC: 0 % (ref 0.0–0.2)

## 2020-01-31 LAB — BASIC METABOLIC PANEL
Anion gap: 9 (ref 5–15)
BUN: 18 mg/dL (ref 6–20)
CO2: 22 mmol/L (ref 22–32)
Calcium: 8.7 mg/dL — ABNORMAL LOW (ref 8.9–10.3)
Chloride: 103 mmol/L (ref 98–111)
Creatinine, Ser: 2.13 mg/dL — ABNORMAL HIGH (ref 0.61–1.24)
GFR calc Af Amer: 39 mL/min — ABNORMAL LOW (ref >60)
GFR calc non Af Amer: 34 mL/min — ABNORMAL LOW (ref >60)
Glucose, Bld: 175 mg/dL — ABNORMAL HIGH (ref 70–99)
Potassium: 3.8 mmol/L (ref 3.5–5.1)
Sodium: 134 mmol/L — ABNORMAL LOW (ref 135–145)

## 2020-01-31 LAB — URINALYSIS, ROUTINE W REFLEX MICROSCOPIC
Bacteria, UA: NONE SEEN
Bilirubin Urine: NEGATIVE
Glucose, UA: NEGATIVE mg/dL
Ketones, ur: NEGATIVE mg/dL
Leukocytes,Ua: NEGATIVE
Nitrite: NEGATIVE
Protein, ur: 30 mg/dL — AB
Specific Gravity, Urine: 1.021 (ref 1.005–1.030)
pH: 5 (ref 5.0–8.0)

## 2020-01-31 MED ORDER — KETOROLAC TROMETHAMINE 10 MG PO TABS: 10.0000 mg | ORAL_TABLET | Freq: Four times a day (QID) | ORAL | 0 refills | Status: DC | PRN

## 2020-01-31 MED ORDER — SODIUM CHLORIDE 0.9 % IV BOLUS
1000.0000 mL | Freq: Once | INTRAVENOUS | Status: AC
  Administered 2020-01-31: 1000 mL via INTRAVENOUS

## 2020-01-31 MED ORDER — ONDANSETRON HCL 4 MG/2ML IJ SOLN
4.0000 mg | Freq: Once | INTRAMUSCULAR | Status: AC
  Administered 2020-01-31: 4 mg via INTRAVENOUS
  Filled 2020-01-31: qty 2

## 2020-01-31 MED ORDER — MORPHINE SULFATE (PF) 4 MG/ML IV SOLN
4.0000 mg | Freq: Once | INTRAVENOUS | Status: AC
  Administered 2020-01-31: 4 mg via INTRAVENOUS
  Filled 2020-01-31: qty 1

## 2020-01-31 MED ORDER — OXYCODONE-ACETAMINOPHEN 5-325 MG PO TABS: 1.0000 | ORAL_TABLET | ORAL | 0 refills | Status: DC | PRN

## 2020-01-31 MED ORDER — KETOROLAC TROMETHAMINE 30 MG/ML IJ SOLN
30.0000 mg | Freq: Once | INTRAMUSCULAR | Status: AC
  Administered 2020-01-31: 30 mg via INTRAVENOUS
  Filled 2020-01-31: qty 1

## 2020-01-31 MED ORDER — ONDANSETRON HCL 8 MG PO TABS: 8.0000 mg | ORAL_TABLET | ORAL | 0 refills | Status: DC | PRN

## 2020-01-31 NOTE — ED Provider Notes (Signed)
[facility] EMERGENCY DEPARTMENT Provider Note   CSN: [id number] Arrival date & time: 01/31/20  7829     History Chief Complaint  Patient presents with  . Flank Pain    Philip Caldwell is a 54 y.o. male.  Patient with intermittent left flank pain for the past 3 days.  Feels similar to when he was diagnosed with a kidney stone in January.  He has urinary frequency and urgency but no pain or hematuria.  He did take Motrin at home without much relief.  Denies any fevers, chills, nausea or vomiting.  No radiation of the pain of his back.  No focal weakness, numbness or tingling.  No testicular pain.  No previous abdominal surgeries. History of diabetes and hypertension. States he was diagnosed by urinalysis in his PCPs office in January with kidney stones.  The history is provided by the patient.  Flank Pain Pertinent negatives include no abdominal pain, no headaches and no shortness of breath.       Past Medical History:  Diagnosis Date  . A-fib ([place])   . Arm pain, central    with intermittent numbness  . Hypertension   . Neck pain   . Tendonitis     Patient Active Problem List   Diagnosis Date Noted  . Type 2 diabetes mellitus without complication, without long-term current use of insulin ([place]) 11/06/2018  . Dehydration 07/21/2018  . Hyponatremia 07/21/2018  . AKI (acute kidney injury) ([place]) 07/21/2018  . Essential hypertension 10/14/2014  . Chronic arthralgias of knees and hips 10/14/2014  . SHOULDER PAIN 07/15/2008  . IMPINGEMENT SYNDROME 07/15/2008  . [place] HEAD 07/15/2008    Past Surgical History:  Procedure Laterality Date  . COLONOSCOPY N/A 10/08/2015   Procedure: COLONOSCOPY;  Surgeon: Rogene Houston, MD;  [facility]: [facility];  Service: Endoscopy;  Laterality: N/A;  1030  . NERVE SURGERY         Family History  Problem Relation Age of Onset  . Dementia Mother   . Cancer Father   . Heart attack Brother     Social History   Tobacco  Use  . Smoking status: Never Smoker  . Smokeless tobacco: Never Used  Substance Use Topics  . Alcohol use: No  . Drug use: No    Home Medications Prior to Admission medications   Medication Sig Start Date End Date Taking? Authorizing Provider  Accu-Chek Softclix Lancets lancets USE TO OBTAIN A BLOOD SPECIMEN TO TEST BLOOD SUGAR UP TO 4 TIMES A DAY 10/15/19   [name], MD  blood glucose meter kit and supplies KIT Dispense based on patient and insurance preference. Use up to four times daily as directed. (FOR ICD-9 250.00, 250.01). 07/22/18   Kathie Dike, MD  blood glucose meter kit and supplies KIT Dispense based on patient and insurance preference. Use up to test glucose daily.  (FOR ICD - 10) 11/27/18   [name], MD  glipiZIDE (GLUCOTROL) 5 MG tablet TAKE 1 TABLET (5 MG TOTAL) BY MOUTH 2 (TWO) TIMES DAILY BEFORE A MEAL. 01/28/20   [name], MD  HYDROcodone-acetaminophen (NORCO/VICODIN) 5-325 MG tablet Take one tablet po q 4-6 hrs prn pain 12/19/19   [name], MD  ketoconazole (NIZORAL) 2 % cream Apply twice daily to affected area 11/06/18   [name], MD  losartan (COZAAR) 25 MG tablet TAKE 2 TABLETS (50 MG) BY MOUTH EVERY DAY 08/31/19   [name], MD  metFORMIN (GLUCOPHAGE)  500 MG tablet TAKE 1 TABLET (500 MG TOTAL) BY MOUTH 2 (TWO) TIMES DAILY WITH A MEAL. 12/17/19   [name], MD    Allergies    Lisinopril and Penicillins  Review of Systems   Review of Systems  Constitutional: Negative for activity change, appetite change and fever.  HENT: Negative for congestion and rhinorrhea.   Eyes: Negative for visual disturbance.  Respiratory: Negative for cough, chest tightness and shortness of breath.   Gastrointestinal: Negative for abdominal pain, nausea and vomiting.  Genitourinary: Positive for dysuria, flank pain and urgency. Negative for decreased urine volume, hematuria and testicular pain.  Musculoskeletal: Positive for back  pain. Negative for arthralgias and myalgias.  Skin: Negative for rash.  Neurological: Negative for dizziness, weakness and headaches.   all other systems are negative except as noted in the HPI and PMH.    Physical Exam Updated Vital Signs BP (!) 157/97 (BP Location: Left Arm)   Pulse 87   Temp 98.4 F (36.9 C) (Oral)   Resp 20   Ht 5' 9"  (1.753 m)   Wt 86.2 kg   SpO2 98%   BMI 28.06 kg/m   Physical Exam Vitals and nursing note reviewed.  Constitutional:      General: He is not in acute distress.    Appearance: He is well-developed.     Comments: Resting comfortably  HENT:     Head: Normocephalic and atraumatic.     Mouth/Throat:     Pharynx: No oropharyngeal exudate.  Eyes:     Conjunctiva/sclera: Conjunctivae normal.     Pupils: Pupils are equal, round, and reactive to light.  Neck:     Comments: No meningismus. Cardiovascular:     Rate and Rhythm: Normal rate and regular rhythm.     Heart sounds: Normal heart sounds. No murmur.  Pulmonary:     Effort: Pulmonary effort is normal. No respiratory distress.     Breath sounds: Normal breath sounds.  Abdominal:     Palpations: Abdomen is soft.     Tenderness: There is no abdominal tenderness. There is no guarding or rebound.  Genitourinary:    Comments: No testicular pain, no appreciable hernia Musculoskeletal:        General: Tenderness present. Normal range of motion.     Cervical back: Normal range of motion and neck supple.     Comments: Left CVA tenderness  Skin:    General: Skin is warm.     Capillary Refill: Capillary refill takes less than 2 seconds.  Neurological:     General: No focal deficit present.     Mental Status: He is alert and oriented to person, place, and time. Mental status is at baseline.     Cranial Nerves: No cranial nerve deficit.     Motor: No abnormal muscle tone.     Coordination: Coordination normal.     Comments: No ataxia on finger to nose bilaterally. No pronator drift. 5/5  strength throughout. CN 2-12 intact.Equal grip strength. Sensation intact.   Psychiatric:        Behavior: Behavior normal.     ED Results / Procedures / Treatments   Labs (all labs ordered are listed, but only abnormal results are displayed) Labs Reviewed  URINALYSIS, ROUTINE W REFLEX MICROSCOPIC  CBC WITH DIFFERENTIAL/PLATELET  BASIC METABOLIC PANEL    EKG None  Radiology No results found.  Procedures Procedures (including critical care time)  Medications Ordered in ED Medications  sodium chloride 0.9 % bolus 1,000  mL (has no administration in time range)  ondansetron (ZOFRAN) injection 4 mg (has no administration in time range)  morphine 4 MG/ML injection 4 mg (has no administration in time range)  ketorolac (TORADOL) 30 MG/ML injection 30 mg (has no administration in time range)    ED Course  I have reviewed the triage vital signs and the nursing notes.  Pertinent labs & imaging results that were available during my care of the patient were reviewed by me and considered in my medical decision making (see chart for details).    MDM Rules/Calculators/A&P                      Patient here with intermittent flank pain for the past 3 days similar to previous kidney stones.  He is comfortable and appears well.  No fever or vomiting.  We will obtain labs, urinalysis, CT imaging as he has had no previous imaging. Pain and nausea medication given.  Dr. [name] to assume care at shift change. Final Clinical Impression(s) / ED Diagnoses Final diagnoses:  None    Rx / DC Orders ED Discharge Orders    None       Donna Snooks, Annie Main, MD 01/31/20 [phone]

## 2020-01-31 NOTE — ED Provider Notes (Addendum)
Recheck prior to discharge.  Discussed CT results including left-sided kidney stone and pulmonary nodules.  Additionally, discussed elevated creatinine and elevated glucose.  Creatinine was discussed with the urologist on-call.  Patient will follow up with urology and/or primary care.  Discharge medications  Zofran 8 mg.   Nat Christen, MD 01/31/20 [place]    Nat Christen, MD 01/31/20 1143

## 2020-01-31 NOTE — Discharge Instructions (Addendum)
You have a small kidney stone on the left side.  This will likely pass.  For further information, suggest following up with a urologist.  Phone number given.  Additionally your glucose is elevated (175) and your creatinine is elevated (2.13) which is a measure of kidney function.  Prescription for pain medicine and nausea medicine sent to your pharmacy.  Additionally, you have some very small nodules in your lungs.  If you are a smoker, would need follow-up.  These are unlikely anything to worry about, but I must inform you about them.  Can discuss with your primary care doctor.  Recommend rechecking your blood work in 1 to 2 weeks when you feel better.

## 2020-01-31 NOTE — ED Triage Notes (Signed)
Pt arrives to ED w/complaints of L flank pain that started Monday evening. Pt states hx of kidney stones.

## 2020-02-02 ENCOUNTER — Encounter (HOSPITAL_COMMUNITY): Payer: Self-pay

## 2020-02-02 ENCOUNTER — Emergency Department (HOSPITAL_COMMUNITY)
Admission: EM | Admit: 2020-02-02 | Discharge: 2020-02-02 | Disposition: A | Discharge status: Home / Self Care | Payer: 59 | Attending: Emergency Medicine | Admitting: Emergency Medicine

## 2020-02-02 ENCOUNTER — Other Ambulatory Visit: Payer: Self-pay

## 2020-02-02 DIAGNOSIS — Z79899 Other long term (current) drug therapy: Secondary | ICD-10-CM | POA: Diagnosis not present

## 2020-02-02 DIAGNOSIS — E119 Type 2 diabetes mellitus without complications: Secondary | ICD-10-CM | POA: Insufficient documentation

## 2020-02-02 DIAGNOSIS — I10 Essential (primary) hypertension: Secondary | ICD-10-CM | POA: Insufficient documentation

## 2020-02-02 DIAGNOSIS — R109 Unspecified abdominal pain: Secondary | ICD-10-CM | POA: Diagnosis present

## 2020-02-02 DIAGNOSIS — Z7984 Long term (current) use of oral hypoglycemic drugs: Secondary | ICD-10-CM | POA: Diagnosis not present

## 2020-02-02 DIAGNOSIS — R110 Nausea: Secondary | ICD-10-CM | POA: Diagnosis not present

## 2020-02-02 DIAGNOSIS — N23 Unspecified renal colic: Secondary | ICD-10-CM | POA: Insufficient documentation

## 2020-02-02 HISTORY — DX: Disorder of kidney and ureter, unspecified: N28.9

## 2020-02-02 HISTORY — DX: Type 2 diabetes mellitus without complications: E11.9

## 2020-02-02 LAB — URINALYSIS, ROUTINE W REFLEX MICROSCOPIC
Bacteria, UA: NONE SEEN
Bilirubin Urine: NEGATIVE
Glucose, UA: NEGATIVE mg/dL
Ketones, ur: NEGATIVE mg/dL
Leukocytes,Ua: NEGATIVE
Nitrite: NEGATIVE
Protein, ur: NEGATIVE mg/dL
RBC / HPF: >50 RBC/hpf — ABNORMAL HIGH (ref 0–5)
Specific Gravity, Urine: 1.014 (ref 1.005–1.030)
pH: 5 (ref 5.0–8.0)

## 2020-02-02 LAB — CBC WITH DIFFERENTIAL/PLATELET
Abs Immature Granulocytes: 0.1 10*3/uL — ABNORMAL HIGH (ref 0.00–0.07)
Basophils Absolute: 0.1 10*3/uL (ref 0.0–0.1)
Basophils Relative: 1 %
Eosinophils Absolute: 0.2 10*3/uL (ref 0.0–0.5)
Eosinophils Relative: 2 %
HCT: 40.6 % (ref 39.0–52.0)
Hemoglobin: 14.1 g/dL (ref 13.0–17.0)
Immature Granulocytes: 1 %
Lymphocytes Relative: 16 %
Lymphs Abs: 2.1 10*3/uL (ref 0.7–4.0)
MCH: 28 pg (ref 26.0–34.0)
MCHC: 34.7 g/dL (ref 30.0–36.0)
MCV: 80.7 fL (ref 80.0–100.0)
Monocytes Absolute: 0.9 10*3/uL (ref 0.1–1.0)
Monocytes Relative: 8 %
Neutro Abs: 9.1 10*3/uL — ABNORMAL HIGH (ref 1.7–7.7)
Neutrophils Relative %: 72 %
Platelets: 366 10*3/uL (ref 150–400)
RBC: 5.03 MIL/uL (ref 4.22–5.81)
RDW: 14.7 % (ref 11.5–15.5)
WBC: 12.5 10*3/uL — ABNORMAL HIGH (ref 4.0–10.5)
nRBC: 0 % (ref 0.0–0.2)

## 2020-02-02 LAB — BASIC METABOLIC PANEL
Anion gap: 7 (ref 5–15)
BUN: 14 mg/dL (ref 6–20)
CO2: 25 mmol/L (ref 22–32)
Calcium: 8.8 mg/dL — ABNORMAL LOW (ref 8.9–10.3)
Chloride: 102 mmol/L (ref 98–111)
Creatinine, Ser: 1.43 mg/dL — ABNORMAL HIGH (ref 0.61–1.24)
GFR calc Af Amer: >60 mL/min (ref >60)
GFR calc non Af Amer: 55 mL/min — ABNORMAL LOW (ref >60)
Glucose, Bld: 164 mg/dL — ABNORMAL HIGH (ref 70–99)
Potassium: 3.8 mmol/L (ref 3.5–5.1)
Sodium: 134 mmol/L — ABNORMAL LOW (ref 135–145)

## 2020-02-02 MED ORDER — SODIUM CHLORIDE 0.9 % IV BOLUS
1000.0000 mL | Freq: Once | INTRAVENOUS | Status: AC
  Administered 2020-02-02: 1000 mL via INTRAVENOUS

## 2020-02-02 MED ORDER — HYDROMORPHONE HCL 1 MG/ML IJ SOLN
1.0000 mg | Freq: Once | INTRAMUSCULAR | Status: AC
  Administered 2020-02-02: 1 mg via INTRAVENOUS
  Filled 2020-02-02: qty 1

## 2020-02-02 MED ORDER — KETOROLAC TROMETHAMINE 30 MG/ML IJ SOLN
30.0000 mg | Freq: Once | INTRAMUSCULAR | Status: AC
  Administered 2020-02-02: 30 mg via INTRAVENOUS
  Filled 2020-02-02: qty 1

## 2020-02-02 MED ORDER — TAMSULOSIN HCL 0.4 MG PO CAPS
0.4000 mg | ORAL_CAPSULE | Freq: Once | ORAL | Status: AC
  Administered 2020-02-02: 0.4 mg via ORAL
  Filled 2020-02-02: qty 1

## 2020-02-02 MED ORDER — ONDANSETRON HCL 4 MG/2ML IJ SOLN
4.0000 mg | Freq: Once | INTRAMUSCULAR | Status: AC
  Administered 2020-02-02: 4 mg via INTRAVENOUS
  Filled 2020-02-02: qty 2

## 2020-02-02 MED ORDER — SODIUM CHLORIDE 0.9 % IV SOLN
1.5000 mg/kg | Freq: Once | INTRAVENOUS | Status: DC
  Filled 2020-02-02: qty 6.5

## 2020-02-02 MED ORDER — TAMSULOSIN HCL 0.4 MG PO CAPS: 0.4000 mg | ORAL_CAPSULE | Freq: Every day | ORAL | 0 refills | Status: DC

## 2020-02-02 NOTE — ED Triage Notes (Signed)
Patient c/o intermittent left flank pain x 4 days. paient states he went to [facility] and was told he had a kidney stone. Patient states his left flank pain has increased.. Patient denies any problems urinating.

## 2020-02-02 NOTE — ED Notes (Signed)
Discharge paperwork reviewed with pt.  Pt with no questions or concerns at this time.  Ambulatory at discharge.

## 2020-02-02 NOTE — ED Provider Notes (Signed)
[facility] DEPT Provider Note   CSN: [id number] Arrival date & time: 02/02/20  1049     History Chief Complaint  Patient presents with  . Flank Pain    Philip Caldwell is a 54 y.o. male.  HPI   54 year old male with history of A. fib, diabetes, hypertension, presenting to the ED today for evaluation of left flank pain.  He was seen in the ED a few days ago for evaluation of similar symptoms and was told he had a kidney stone.  He was discharged with pain medications.  He states that his symptoms had improved yesterday but recurred this morning and have been constant since onset.  Pain is severe in nature.  He tried taking the oxycodone and ketorolac at home without significant relief.  Today he also has associated nausea but denies any vomiting, diarrhea, constipation or fevers.  He denies any dysuria, frequency, urgency or hematuria.  He has not followed up with urology yet.  Past Medical History:  Diagnosis Date  . A-fib ([place])   . Arm pain, central    with intermittent numbness  . Diabetes mellitus without complication ([place])   . Hypertension   . Neck pain   . Renal disorder   . Tendonitis     Patient Active Problem List   Diagnosis Date Noted  . Type 2 diabetes mellitus without complication, without long-term current use of insulin ([place]) 11/06/2018  . Dehydration 07/21/2018  . Hyponatremia 07/21/2018  . AKI (acute kidney injury) ([place]) 07/21/2018  . Essential hypertension 10/14/2014  . Chronic arthralgias of knees and hips 10/14/2014  . SHOULDER PAIN 07/15/2008  . IMPINGEMENT SYNDROME 07/15/2008  . [place] HEAD 07/15/2008    Past Surgical History:  Procedure Laterality Date  . COLONOSCOPY N/A 10/08/2015   Procedure: COLONOSCOPY;  Surgeon: Rogene Houston, MD;  [facility];  Service: Endoscopy;  Laterality: N/A;  1030  . NERVE SURGERY         Family History  Problem Relation Age of Onset  . Dementia Mother   .  Cancer Father   . Heart attack Brother     Social History   Tobacco Use  . Smoking status: Never Smoker  . Smokeless tobacco: Never Used  Substance Use Topics  . Alcohol use: No  . Drug use: No    Home Medications Prior to Admission medications   Medication Sig Start Date End Date Taking? Authorizing Provider  glipiZIDE (GLUCOTROL) 5 MG tablet TAKE 1 TABLET (5 MG TOTAL) BY MOUTH 2 (TWO) TIMES DAILY BEFORE A MEAL. 01/28/20  Yes [name], MD  HYDROcodone-acetaminophen (NORCO/VICODIN) 5-325 MG tablet Take one tablet po q 4-6 hrs prn pain 12/19/19  Yes [name], MD  ketorolac (TORADOL) 10 MG tablet Take 10 mg by mouth every 6 (six) hours as needed for moderate pain or severe pain.  01/31/20  Yes [provider]  losartan (COZAAR) 25 MG tablet TAKE 2 TABLETS (50 MG) BY MOUTH EVERY DAY 08/31/19  Yes [name], MD  metFORMIN (GLUCOPHAGE) 500 MG tablet TAKE 1 TABLET (500 MG TOTAL) BY MOUTH 2 (TWO) TIMES DAILY WITH A MEAL. Patient taking differently: Take 500 mg by mouth 2 (two) times daily with a meal.  12/17/19  Yes [name], MD  ondansetron (ZOFRAN) 8 MG tablet Take 1 tablet (8 mg total) by mouth every 4 (four) hours as needed. Patient taking differently: Take 8 mg by mouth every 4 (four) hours  as needed for nausea or vomiting.  01/31/20  Yes Nat Christen, MD  oxyCODONE-acetaminophen (PERCOCET) 5-325 MG tablet Take 1 tablet by mouth every 4 (four) hours as needed. Patient taking differently: Take 1 tablet by mouth every 4 (four) hours as needed for moderate pain or severe pain.  01/31/20  Yes Nat Christen, MD  Accu-Chek Softclix Lancets lancets USE TO OBTAIN A BLOOD SPECIMEN TO TEST BLOOD SUGAR UP TO 4 TIMES A DAY 10/15/19   [name], MD  blood glucose meter kit and supplies KIT Dispense based on patient and insurance preference. Use up to four times daily as directed. (FOR ICD-9 250.00, 250.01). 07/22/18   Kathie Dike, MD  blood glucose meter kit and  supplies KIT Dispense based on patient and insurance preference. Use up to test glucose daily.  (FOR ICD - 10) 11/27/18   [name], MD  ketoconazole (NIZORAL) 2 % cream Apply twice daily to affected area Patient not taking: Reported on 02/02/2020 11/06/18   [name], MD  tamsulosin (FLOMAX) 0.4 MG CAPS capsule Take 1 capsule (0.4 mg total) by mouth daily. 02/02/20   Heavenly Christine S, PA-C    Allergies    Lisinopril and Penicillins  Review of Systems   Review of Systems  Constitutional: Negative for chills and fever.  HENT: Negative for ear pain and sore throat.   Eyes: Negative for visual disturbance.  Respiratory: Negative for cough and shortness of breath.   Cardiovascular: Negative for chest pain.  Gastrointestinal: Positive for nausea. Negative for abdominal pain, constipation, diarrhea and vomiting.  Genitourinary: Positive for flank pain. Negative for dysuria, hematuria and urgency.  Musculoskeletal: Negative for back pain.  Skin: Negative for rash.  Neurological: Negative for headaches.  All other systems reviewed and are negative.   Physical Exam Updated Vital Signs BP 138/80   Pulse 64   Temp 98.6 F (37 C) (Oral)   Resp 17   Ht _0  (1.753 m)   Wt 86.2 kg   SpO2 97%   BMI 28.06 kg/m   Physical Exam Vitals and nursing note reviewed.  Constitutional:      Appearance: He is well-developed.  HENT:     Head: Normocephalic and atraumatic.  Eyes:     Conjunctiva/sclera: Conjunctivae normal.  Cardiovascular:     Rate and Rhythm: Normal rate and regular rhythm.     Heart sounds: Normal heart sounds. No murmur.  Pulmonary:     Effort: Pulmonary effort is normal. No respiratory distress.     Breath sounds: Normal breath sounds. No wheezing, rhonchi or rales.  Abdominal:     General: Bowel sounds are normal.     Palpations: Abdomen is soft.     Tenderness: There is no abdominal tenderness. There is left CVA tenderness. There is no right CVA  tenderness, guarding or rebound.  Musculoskeletal:     Cervical back: Neck supple.  Skin:    General: Skin is warm and dry.  Neurological:     Mental Status: He is alert.     ED Results / Procedures / Treatments   Labs (all labs ordered are listed, but only abnormal results are displayed) Labs Reviewed  URINALYSIS, ROUTINE W REFLEX MICROSCOPIC - Abnormal; Notable for the following components:      Result Value   Hgb urine dipstick LARGE (*)    RBC / HPF >50 (*)    All other components within normal limits  CBC WITH DIFFERENTIAL/PLATELET - Abnormal; Notable for the  following components:   WBC 12.5 (*)    Neutro Abs 9.1 (*)    Abs Immature Granulocytes 0.10 (*)    All other components within normal limits  BASIC METABOLIC PANEL - Abnormal; Notable for the following components:   Sodium 134 (*)    Glucose, Bld 164 (*)    Creatinine, Ser 1.43 (*)    Calcium 8.8 (*)    GFR calc non Af Amer 55 (*)    All other components within normal limits  URINE CULTURE    EKG None  Radiology No results found.  Procedures Procedures (including critical care time)  Medications Ordered in ED Medications  sodium chloride 0.9 % bolus 1,000 mL (1,000 mLs Intravenous New Bag/Given 02/02/20 1144)  ondansetron (ZOFRAN) injection 4 mg (4 mg Intravenous Given 02/02/20 1142)  HYDROmorphone (DILAUDID) injection 1 mg (1 mg Intravenous Given 02/02/20 1142)  tamsulosin (FLOMAX) capsule 0.4 mg (0.4 mg Oral Given 02/02/20 1144)  ketorolac (TORADOL) 30 MG/ML injection 30 mg (30 mg Intravenous Given 02/02/20 1235)    ED Course  I have reviewed the triage vital signs and the nursing notes.  Pertinent labs & imaging results that were available during my care of the patient were reviewed by me and considered in my medical decision making (see chart for details).    MDM Rules/Calculators/A&P                      54 year old male presenting for evaluation of persistent left flank pain.  Was seen at [facility]  2 days ago and diagnosed with kidney stone.  Reviewed records, patient had CT scan at that time which showed 2 to 3 mm stone at the distal left ureter with mild left hydronephrosis.  Reviewed labs CBC with mild leukocytosis, no anemia.  BMP with mild aki which is improved from prior labs 2 days ago. GFR is wnl.  UA with hematuria w/o signs of infection.  Urine culture sent.  Patient was given IV fluids, nausea medication and pain meds.  On reassessment, he states he no longer has any pain. He is currently tolerating PO.  He does not have any evidence of an infected stone today and his pain is not intractable therefore I feel like he is appropriate for discharge with urology follow-up.  We discussed continuation of of his oxycodone and ketorolac that he already has a prescription for.  He also has antiemetics at home.  I will give him an Rx for tamsulosin to take daily as well.  We will give him urology follow-up information again.  Advised on return precautions.  He voiced understanding plan reasons to return.  All questions answered.  Patient stable for discharge.  Final Clinical Impression(Cal[place]) / ED Diagnoses Final diagnoses:  Ureteral colic    Rx / DC Orders ED Discharge Orders         Ordered    tamsulosin (FLOMAX) 0.4 MG CAPS capsule  Daily     02/02/20 1310           Philip Abate S, PA-C 02/02/20 1310    [place], Quita Skye, DO 02/03/20 1624

## 2020-02-02 NOTE — ED Notes (Signed)
Pt ambulatory to and from bathroom, no assistance needed.  

## 2020-02-02 NOTE — Discharge Instructions (Signed)
Please follow-up with the urology office that was provided for you on your discharge paperwork.  Please return to the emergency department for new or worsening symptoms including fevers, intractable pain, intractable vomiting.

## 2020-02-03 LAB — URINE CULTURE: Culture: NO GROWTH

## 2020-02-14 ENCOUNTER — Encounter: Payer: Self-pay | Admitting: Family Medicine

## 2020-02-19 ENCOUNTER — Encounter: Payer: Self-pay | Admitting: Family Medicine

## 2020-02-21 ENCOUNTER — Other Ambulatory Visit: Payer: Self-pay

## 2020-02-21 ENCOUNTER — Ambulatory Visit: Payer: 59 | Attending: Internal Medicine

## 2020-02-21 DIAGNOSIS — Z23 Encounter for immunization: Secondary | ICD-10-CM

## 2020-02-21 NOTE — Progress Notes (Signed)
   Covid-19 Vaccination Clinic  Name:  Philip Caldwell    MRN: [id number] DOB: Oct 26, 1966  02/21/2020  [name] was observed post Covid-19 immunization for 15 minutes without incident. He was provided with Vaccine Information Sheet and instruction to access the V-Safe system.   Mr. Marks was instructed to call 911 with any severe reactions post vaccine: Marland Kitchen Difficulty breathing  . Swelling of face and throat  . A fast heartbeat  . A bad rash all over body  . Dizziness and weakness   Immunizations Administered    Name Date Dose VIS Date Route   Moderna COVID-19 Vaccine 02/21/2020 10:05 AM 0.5 mL 10/30/2019 Intramuscular   Manufacturer: Moderna   Lot: [id number]   [id number]

## 2020-02-27 ENCOUNTER — Other Ambulatory Visit: Payer: Self-pay | Admitting: Family Medicine

## 2020-03-03 ENCOUNTER — Telehealth: Payer: Self-pay | Admitting: Family Medicine

## 2020-03-03 DIAGNOSIS — Z13220 Encounter for screening for lipoid disorders: Secondary | ICD-10-CM

## 2020-03-03 DIAGNOSIS — Z0000 Encounter for general adult medical examination without abnormal findings: Secondary | ICD-10-CM

## 2020-03-03 DIAGNOSIS — E1165 Type 2 diabetes mellitus with hyperglycemia: Secondary | ICD-10-CM

## 2020-03-03 DIAGNOSIS — Z125 Encounter for screening for malignant neoplasm of prostate: Secondary | ICD-10-CM

## 2020-03-03 DIAGNOSIS — Z79899 Other long term (current) drug therapy: Secondary | ICD-10-CM

## 2020-03-03 NOTE — Telephone Encounter (Signed)
Lip liv A1c psa met 7

## 2020-03-03 NOTE — Telephone Encounter (Signed)
Orders put in and pt was notified.  

## 2020-03-03 NOTE — Telephone Encounter (Signed)
Patient has physical on 4/19 and requesting labs.

## 2020-03-03 NOTE — Telephone Encounter (Signed)
Last labs 02/02/2020: CBC, Met 7                   08/23/19: Lipid, Liver, Met 7 HgbA1c and urine micro

## 2020-03-08 LAB — HEPATIC FUNCTION PANEL
ALT: 32 IU/L (ref 0–44)
AST: 20 IU/L (ref 0–40)
Albumin: 4.4 g/dL (ref 3.8–4.9)
Alkaline Phosphatase: 91 IU/L (ref 39–117)
Bilirubin Total: 0.4 mg/dL (ref 0.0–1.2)
Bilirubin, Direct: 0.12 mg/dL (ref 0.00–0.40)
Total Protein: 7.1 g/dL (ref 6.0–8.5)

## 2020-03-08 LAB — HEMOGLOBIN A1C
Est. average glucose Bld gHb Est-mCnc: 206 mg/dL
Hgb A1c MFr Bld: 8.8 % — ABNORMAL HIGH (ref 4.8–5.6)

## 2020-03-08 LAB — BASIC METABOLIC PANEL
BUN/Creatinine Ratio: 10 (ref 9–20)
BUN: 15 mg/dL (ref 6–24)
CO2: 23 mmol/L (ref 20–29)
Calcium: 9.6 mg/dL (ref 8.7–10.2)
Chloride: 99 mmol/L (ref 96–106)
Creatinine, Ser: 1.55 mg/dL — ABNORMAL HIGH (ref 0.76–1.27)
GFR calc Af Amer: 57 mL/min/{1.73_m2} — ABNORMAL LOW (ref >59)
GFR calc non Af Amer: 50 mL/min/{1.73_m2} — ABNORMAL LOW (ref >59)
Glucose: 170 mg/dL — ABNORMAL HIGH (ref 65–99)
Potassium: 4.4 mmol/L (ref 3.5–5.2)
Sodium: 137 mmol/L (ref 134–144)

## 2020-03-08 LAB — LIPID PANEL
Chol/HDL Ratio: 5.1 ratio — ABNORMAL HIGH (ref 0.0–5.0)
Cholesterol, Total: 204 mg/dL — ABNORMAL HIGH (ref 100–199)
HDL: 40 mg/dL (ref >39)
LDL Chol Calc (NIH): 105 mg/dL — ABNORMAL HIGH (ref 0–99)
Triglycerides: 344 mg/dL — ABNORMAL HIGH (ref 0–149)
VLDL Cholesterol Cal: 59 mg/dL — ABNORMAL HIGH (ref 5–40)

## 2020-03-08 LAB — PSA: Prostate Specific Ag, Serum: 1.8 ng/mL (ref 0.0–4.0)

## 2020-03-14 ENCOUNTER — Other Ambulatory Visit: Payer: Self-pay | Admitting: Dermatology

## 2020-03-17 ENCOUNTER — Ambulatory Visit (INDEPENDENT_AMBULATORY_CARE_PROVIDER_SITE_OTHER): Payer: 59 | Admitting: Family Medicine

## 2020-03-17 ENCOUNTER — Encounter: Payer: Self-pay | Admitting: Family Medicine

## 2020-03-17 ENCOUNTER — Other Ambulatory Visit: Payer: Self-pay

## 2020-03-17 VITALS — BP 124/72 | Temp 97.4°F | Ht 69.0 in | Wt 199.2 lb

## 2020-03-17 DIAGNOSIS — Z0000 Encounter for general adult medical examination without abnormal findings: Secondary | ICD-10-CM

## 2020-03-17 DIAGNOSIS — E119 Type 2 diabetes mellitus without complications: Secondary | ICD-10-CM

## 2020-03-17 DIAGNOSIS — I10 Essential (primary) hypertension: Secondary | ICD-10-CM

## 2020-03-17 MED ORDER — LOSARTAN POTASSIUM 25 MG PO TABS: ORAL_TABLET | ORAL | 1 refills | Status: DC

## 2020-03-17 MED ORDER — METFORMIN HCL 500 MG PO TABS: ORAL_TABLET | ORAL | 1 refills | Status: DC

## 2020-03-17 MED ORDER — HYDROCORTISONE 2.5 % EX OINT: TOPICAL_OINTMENT | CUTANEOUS | 0 refills | Status: DC

## 2020-03-17 NOTE — Progress Notes (Signed)
Subjective:    Patient ID: Philip Caldwell, male    DOB: 26-Jul-1966, 54 y.o.   MRN: [id number]  HPI The patient comes in today for a wellness visit.    A review of their health history was completed.  A review of medications was also completed.  Any needed refills; update refills.   Eating habits: health conscious  Falls/  MVA accidents in past few months: none  Regular exercise: yes exercise, yardwork  Specialist pt sees on regular basis: dermatologist  Preventative health issues were discussed.   Additional concerns: would like refill on hydrocortisone ointment 2.5 % at bedtime for eczema around eyes, mouth, and joint area on arms. Prescribed by dermatologist.   Results for orders placed or performed in visit on 03/03/20  Lipid panel  Result Value Ref Range   Cholesterol, Total 204 (H) 100 - 199 mg/dL   Triglycerides 344 (H) 0 - 149 mg/dL   HDL 40 >39 mg/dL   VLDL Cholesterol Cal 59 (H) 5 - 40 mg/dL   LDL Chol Calc (NIH) 105 (H) 0 - 99 mg/dL   Chol/HDL Ratio 5.1 (H) 0.0 - 5.0 ratio  Hepatic function panel  Result Value Ref Range   Total Protein 7.1 6.0 - 8.5 g/dL   Albumin 4.4 3.8 - 4.9 g/dL   Bilirubin Total 0.4 0.0 - 1.2 mg/dL   Bilirubin, Direct 0.12 0.00 - 0.40 mg/dL   Alkaline Phosphatase 91 39 - 117 IU/L   AST 20 0 - 40 IU/L   ALT 32 0 - 44 IU/L  Hemoglobin A1c  Result Value Ref Range   Hgb A1c MFr Bld 8.8 (H) 4.8 - 5.6 %   Est. average glucose Bld gHb Est-mCnc 206 mg/dL  PSA  Result Value Ref Range   Prostate Specific Ag, Serum 1.8 0.0 - 4.0 ng/mL  Basic metabolic panel  Result Value Ref Range   Glucose 170 (H) 65 - 99 mg/dL   BUN 15 6 - 24 mg/dL   Creatinine, Ser 1.55 (H) 0.76 - 1.27 mg/dL   GFR calc non Af Amer 50 (L) >59 mL/min/1.73   GFR calc Af Amer 57 (L) >59 mL/min/1.73   BUN/Creatinine Ratio 10 9 - 20   Sodium 137 134 - 144 mmol/L   Potassium 4.4 3.5 - 5.2 mmol/L   Chloride 99 96 - 106 mmol/L   CO2 23 20 - 29 mmol/L   Calcium 9.6 8.7 -  10.2 mg/dL   Diabetes suboptimal.  Discussed.  On further history patient has not been taking his glipizide.  Had one low sugar spell and did not like that sensation  Blood pressure medicine and blood pressure levels reviewed today with patient. Compliant with blood pressure medicine. States does not miss a dose. No obvious side effects. Blood pressure generally good when checked elsewhere. Watching salt intake.   Review of Systems  Constitutional: Negative for activity change, appetite change and fever.  HENT: Negative for congestion and rhinorrhea.   Eyes: Negative for discharge.  Respiratory: Negative for cough and wheezing.   Cardiovascular: Negative for chest pain.  Gastrointestinal: Negative for abdominal pain, blood in stool and vomiting.  Genitourinary: Negative for difficulty urinating and frequency.  Musculoskeletal: Negative for neck pain.  Skin: Negative for rash.  Allergic/Immunologic: Negative for environmental allergies and food allergies.  Neurological: Negative for weakness and headaches.  Psychiatric/Behavioral: Negative for agitation.  All other systems reviewed and are negative.      Objective:   Physical Exam Vitals  reviewed.  Constitutional:      Appearance: He is well-developed.  HENT:     Head: Normocephalic and atraumatic.     Right Ear: External ear normal.     Left Ear: External ear normal.     Nose: Nose normal.  Eyes:     Pupils: Pupils are equal, round, and reactive to light.  Neck:     Thyroid: No thyromegaly.  Cardiovascular:     Rate and Rhythm: Normal rate and regular rhythm.     Heart sounds: Normal heart sounds. No murmur.  Pulmonary:     Effort: Pulmonary effort is normal. No respiratory distress.     Breath sounds: Normal breath sounds. No wheezing.  Abdominal:     General: Bowel sounds are normal. There is no distension.     Palpations: Abdomen is soft. There is no mass.     Tenderness: There is no abdominal tenderness.    Genitourinary:    Penis: Normal.   Musculoskeletal:        General: Normal range of motion.     Cervical back: Normal range of motion and neck supple.  Lymphadenopathy:     Cervical: No cervical adenopathy.  Skin:    General: Skin is warm and dry.     Findings: No erythema.  Neurological:     Mental Status: He is alert.     Motor: No abnormal muscle tone.  Psychiatric:        Behavior: Behavior normal.        Judgment: Judgment normal.    Prostate within normal limits       Assessment & Plan:  Impression 1 wellness exam.  Up-to-date on colonoscopy.  Diet discussed.  Exercise discussed.  Vaccines discussed  2.  Type 2 diabetes.  Suboptimal control discussed.  Increase Metformin to 500 mg 2 tablets twice daily.  Call us in several weeks with sugar results.  If suboptimal will resume glipizide  3.  Hypertension.  Good control discussed maintain same meds  Follow-up in 6 months  Literally at the last moment patient brought up complicated shoulder and neck neuropathic and orthopedic issues encouraged another visit if need to address

## 2020-04-09 ENCOUNTER — Ambulatory Visit (INDEPENDENT_AMBULATORY_CARE_PROVIDER_SITE_OTHER): Payer: 59 | Admitting: Family Medicine

## 2020-04-09 ENCOUNTER — Other Ambulatory Visit: Payer: Self-pay

## 2020-04-09 VITALS — BP 122/86 | Temp 97.7°F | Wt 203.2 lb

## 2020-04-09 DIAGNOSIS — M7542 Impingement syndrome of left shoulder: Secondary | ICD-10-CM | POA: Diagnosis not present

## 2020-04-09 MED ORDER — METHYLPREDNISOLONE ACETATE 40 MG/ML IJ SUSP: 40.0000 mg | Freq: Once | INTRAMUSCULAR | Status: DC

## 2020-04-09 NOTE — Progress Notes (Signed)
   Subjective:    Patient ID: Philip Caldwell, male    DOB: [birth year], 54 y.o.   MRN: [id number]  HPI Patient comes in today with complaints of left shoulder pain x 4 months.  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 Blood pressure good on repeat. HEENT normal. Lungs clear. Heart regular rate and rhythm. Distinct impingement sign left shoulder  Patient was prepped draped injected 1 cc steroid 2 cc Xylocaine.       Assessment & Plan:  Impression impingement syndrome left shoulder.  Codman's exercises discussed.  Expect gradual resolution if not will need to see orthopedist.

## 2020-05-04 ENCOUNTER — Other Ambulatory Visit: Payer: Self-pay | Admitting: Family Medicine

## 2020-05-05 NOTE — Telephone Encounter (Signed)
Diabetic check and wellness 03/17/20

## 2020-05-22 ENCOUNTER — Other Ambulatory Visit: Payer: Self-pay | Admitting: Family Medicine

## 2020-05-24 ENCOUNTER — Encounter (HOSPITAL_COMMUNITY): Payer: Self-pay | Admitting: Emergency Medicine

## 2020-05-24 ENCOUNTER — Inpatient Hospital Stay (HOSPITAL_COMMUNITY)
Admission: EM | Admit: 2020-05-24 | Discharge: 2020-05-27 | DRG: 661 | Disposition: A | Discharge status: Home / Self Care | Payer: 59 | Attending: Family Medicine | Admitting: Family Medicine

## 2020-05-24 ENCOUNTER — Other Ambulatory Visit: Payer: Self-pay

## 2020-05-24 DIAGNOSIS — Z87442 Personal history of urinary calculi: Secondary | ICD-10-CM

## 2020-05-24 DIAGNOSIS — N183 Chronic kidney disease, stage 3 unspecified: Secondary | ICD-10-CM

## 2020-05-24 DIAGNOSIS — Z114 Encounter for screening for human immunodeficiency virus [HIV]: Secondary | ICD-10-CM

## 2020-05-24 DIAGNOSIS — Z888 Allergy status to other drugs, medicaments and biological substances status: Secondary | ICD-10-CM

## 2020-05-24 DIAGNOSIS — N201 Calculus of ureter: Secondary | ICD-10-CM

## 2020-05-24 DIAGNOSIS — Z79899 Other long term (current) drug therapy: Secondary | ICD-10-CM

## 2020-05-24 DIAGNOSIS — N390 Urinary tract infection, site not specified: Secondary | ICD-10-CM

## 2020-05-24 DIAGNOSIS — N136 Pyonephrosis: Secondary | ICD-10-CM | POA: Diagnosis not present

## 2020-05-24 DIAGNOSIS — Z20822 Contact with and (suspected) exposure to covid-19: Secondary | ICD-10-CM | POA: Diagnosis present

## 2020-05-24 DIAGNOSIS — N1831 Chronic kidney disease, stage 3a: Secondary | ICD-10-CM | POA: Diagnosis present

## 2020-05-24 DIAGNOSIS — Z880 Allergy status to penicillin: Secondary | ICD-10-CM

## 2020-05-24 DIAGNOSIS — R509 Fever, unspecified: Secondary | ICD-10-CM

## 2020-05-24 DIAGNOSIS — N135 Crossing vessel and stricture of ureter without hydronephrosis: Secondary | ICD-10-CM

## 2020-05-24 DIAGNOSIS — N289 Disorder of kidney and ureter, unspecified: Secondary | ICD-10-CM | POA: Diagnosis not present

## 2020-05-24 DIAGNOSIS — I10 Essential (primary) hypertension: Secondary | ICD-10-CM | POA: Diagnosis present

## 2020-05-24 DIAGNOSIS — N189 Chronic kidney disease, unspecified: Secondary | ICD-10-CM

## 2020-05-24 DIAGNOSIS — I129 Hypertensive chronic kidney disease with stage 1 through stage 4 chronic kidney disease, or unspecified chronic kidney disease: Secondary | ICD-10-CM | POA: Diagnosis present

## 2020-05-24 DIAGNOSIS — E1122 Type 2 diabetes mellitus with diabetic chronic kidney disease: Secondary | ICD-10-CM | POA: Diagnosis present

## 2020-05-24 DIAGNOSIS — Z7984 Long term (current) use of oral hypoglycemic drugs: Secondary | ICD-10-CM

## 2020-05-24 DIAGNOSIS — N179 Acute kidney failure, unspecified: Secondary | ICD-10-CM | POA: Diagnosis present

## 2020-05-24 LAB — BASIC METABOLIC PANEL
Anion gap: 10 (ref 5–15)
BUN: 19 mg/dL (ref 6–20)
CO2: 24 mmol/L (ref 22–32)
Calcium: 9.2 mg/dL (ref 8.9–10.3)
Chloride: 99 mmol/L (ref 98–111)
Creatinine, Ser: 2.43 mg/dL — ABNORMAL HIGH (ref 0.61–1.24)
GFR calc Af Amer: 33 mL/min — ABNORMAL LOW (ref >60)
GFR calc non Af Amer: 29 mL/min — ABNORMAL LOW (ref >60)
Glucose, Bld: 225 mg/dL — ABNORMAL HIGH (ref 70–99)
Potassium: 4.5 mmol/L (ref 3.5–5.1)
Sodium: 133 mmol/L — ABNORMAL LOW (ref 135–145)

## 2020-05-24 LAB — CBC
HCT: 40.2 % (ref 39.0–52.0)
Hemoglobin: 14.3 g/dL (ref 13.0–17.0)
MCH: 29.2 pg (ref 26.0–34.0)
MCHC: 35.6 g/dL (ref 30.0–36.0)
MCV: 82 fL (ref 80.0–100.0)
Platelets: 331 10*3/uL (ref 150–400)
RBC: 4.9 MIL/uL (ref 4.22–5.81)
RDW: 13.5 % (ref 11.5–15.5)
WBC: 14.4 10*3/uL — ABNORMAL HIGH (ref 4.0–10.5)
nRBC: 0 % (ref 0.0–0.2)

## 2020-05-24 LAB — URINALYSIS, ROUTINE W REFLEX MICROSCOPIC
Bilirubin Urine: NEGATIVE
Glucose, UA: 50 mg/dL — AB
Hgb urine dipstick: NEGATIVE
Ketones, ur: NEGATIVE mg/dL
Leukocytes,Ua: NEGATIVE
Nitrite: NEGATIVE
Protein, ur: NEGATIVE mg/dL
Specific Gravity, Urine: 1.014 (ref 1.005–1.030)
pH: 5 (ref 5.0–8.0)

## 2020-05-24 LAB — LIPASE, BLOOD: Lipase: 42 U/L (ref 11–51)

## 2020-05-24 MED ORDER — HYDROMORPHONE HCL 1 MG/ML IJ SOLN
1.0000 mg | Freq: Once | INTRAMUSCULAR | Status: AC
  Administered 2020-05-25: 1 mg via INTRAVENOUS
  Filled 2020-05-24: qty 1

## 2020-05-24 MED ORDER — ONDANSETRON HCL 4 MG/2ML IJ SOLN
4.0000 mg | Freq: Once | INTRAMUSCULAR | Status: AC
  Administered 2020-05-25: 4 mg via INTRAVENOUS
  Filled 2020-05-24: qty 2

## 2020-05-24 NOTE — ED Triage Notes (Signed)
Patient states left flank pain and pain in the kidney area that started on Thursday. Patient denies any problems urinating or any blood in urine. Patient states that he has had kidney stones before and that this feels like a kidney stone.

## 2020-05-25 ENCOUNTER — Inpatient Hospital Stay (HOSPITAL_COMMUNITY): Payer: 59 | Admitting: Anesthesiology

## 2020-05-25 ENCOUNTER — Encounter (HOSPITAL_COMMUNITY): Payer: Self-pay | Admitting: Internal Medicine

## 2020-05-25 ENCOUNTER — Encounter (HOSPITAL_COMMUNITY)
Admission: EM | Disposition: A | Discharge status: Home / Self Care | Payer: Self-pay | Source: Home / Self Care | Attending: Family Medicine

## 2020-05-25 ENCOUNTER — Emergency Department (HOSPITAL_COMMUNITY): Payer: 59

## 2020-05-25 ENCOUNTER — Other Ambulatory Visit (HOSPITAL_COMMUNITY): Payer: 59

## 2020-05-25 ENCOUNTER — Inpatient Hospital Stay (HOSPITAL_COMMUNITY): Payer: 59

## 2020-05-25 DIAGNOSIS — E119 Type 2 diabetes mellitus without complications: Secondary | ICD-10-CM | POA: Diagnosis not present

## 2020-05-25 DIAGNOSIS — Z114 Encounter for screening for human immunodeficiency virus [HIV]: Secondary | ICD-10-CM | POA: Diagnosis not present

## 2020-05-25 DIAGNOSIS — I129 Hypertensive chronic kidney disease with stage 1 through stage 4 chronic kidney disease, or unspecified chronic kidney disease: Secondary | ICD-10-CM | POA: Diagnosis present

## 2020-05-25 DIAGNOSIS — Z79899 Other long term (current) drug therapy: Secondary | ICD-10-CM | POA: Diagnosis not present

## 2020-05-25 DIAGNOSIS — N201 Calculus of ureter: Secondary | ICD-10-CM | POA: Diagnosis not present

## 2020-05-25 DIAGNOSIS — N136 Pyonephrosis: Secondary | ICD-10-CM | POA: Diagnosis present

## 2020-05-25 DIAGNOSIS — Z880 Allergy status to penicillin: Secondary | ICD-10-CM | POA: Diagnosis not present

## 2020-05-25 DIAGNOSIS — N289 Disorder of kidney and ureter, unspecified: Secondary | ICD-10-CM

## 2020-05-25 DIAGNOSIS — N135 Crossing vessel and stricture of ureter without hydronephrosis: Secondary | ICD-10-CM

## 2020-05-25 DIAGNOSIS — Z7984 Long term (current) use of oral hypoglycemic drugs: Secondary | ICD-10-CM | POA: Diagnosis not present

## 2020-05-25 DIAGNOSIS — R509 Fever, unspecified: Secondary | ICD-10-CM

## 2020-05-25 DIAGNOSIS — N179 Acute kidney failure, unspecified: Secondary | ICD-10-CM

## 2020-05-25 DIAGNOSIS — Z87442 Personal history of urinary calculi: Secondary | ICD-10-CM | POA: Diagnosis not present

## 2020-05-25 DIAGNOSIS — I10 Essential (primary) hypertension: Secondary | ICD-10-CM

## 2020-05-25 DIAGNOSIS — E1122 Type 2 diabetes mellitus with diabetic chronic kidney disease: Secondary | ICD-10-CM | POA: Diagnosis present

## 2020-05-25 DIAGNOSIS — Z20822 Contact with and (suspected) exposure to covid-19: Secondary | ICD-10-CM | POA: Diagnosis present

## 2020-05-25 DIAGNOSIS — N189 Chronic kidney disease, unspecified: Secondary | ICD-10-CM

## 2020-05-25 DIAGNOSIS — Z888 Allergy status to other drugs, medicaments and biological substances status: Secondary | ICD-10-CM | POA: Diagnosis not present

## 2020-05-25 DIAGNOSIS — N390 Urinary tract infection, site not specified: Secondary | ICD-10-CM

## 2020-05-25 DIAGNOSIS — N1831 Chronic kidney disease, stage 3a: Secondary | ICD-10-CM | POA: Diagnosis present

## 2020-05-25 HISTORY — PX: CYSTOSCOPY WITH STENT PLACEMENT: SHX5790

## 2020-05-25 LAB — GLUCOSE, CAPILLARY
Glucose-Capillary: 173 mg/dL — ABNORMAL HIGH (ref 70–99)
Glucose-Capillary: 182 mg/dL — ABNORMAL HIGH (ref 70–99)
Glucose-Capillary: 248 mg/dL — ABNORMAL HIGH (ref 70–99)
Glucose-Capillary: 273 mg/dL — ABNORMAL HIGH (ref 70–99)

## 2020-05-25 LAB — CBC
HCT: 33.9 % — ABNORMAL LOW (ref 39.0–52.0)
Hemoglobin: 12.2 g/dL — ABNORMAL LOW (ref 13.0–17.0)
MCH: 29 pg (ref 26.0–34.0)
MCHC: 36 g/dL (ref 30.0–36.0)
MCV: 80.7 fL (ref 80.0–100.0)
Platelets: 272 10*3/uL (ref 150–400)
RBC: 4.2 MIL/uL — ABNORMAL LOW (ref 4.22–5.81)
RDW: 13.7 % (ref 11.5–15.5)
WBC: 11.6 10*3/uL — ABNORMAL HIGH (ref 4.0–10.5)
nRBC: 0 % (ref 0.0–0.2)

## 2020-05-25 LAB — BASIC METABOLIC PANEL
Anion gap: 6 (ref 5–15)
BUN: 18 mg/dL (ref 6–20)
CO2: 23 mmol/L (ref 22–32)
Calcium: 8.1 mg/dL — ABNORMAL LOW (ref 8.9–10.3)
Chloride: 104 mmol/L (ref 98–111)
Creatinine, Ser: 2.25 mg/dL — ABNORMAL HIGH (ref 0.61–1.24)
GFR calc Af Amer: 37 mL/min — ABNORMAL LOW (ref >60)
GFR calc non Af Amer: 32 mL/min — ABNORMAL LOW (ref >60)
Glucose, Bld: 181 mg/dL — ABNORMAL HIGH (ref 70–99)
Potassium: 4.4 mmol/L (ref 3.5–5.1)
Sodium: 133 mmol/L — ABNORMAL LOW (ref 135–145)

## 2020-05-25 LAB — SARS CORONAVIRUS 2 BY RT PCR (HOSPITAL ORDER, PERFORMED IN ~~LOC~~ HOSPITAL LAB): SARS Coronavirus 2: NEGATIVE

## 2020-05-25 LAB — HEMOGLOBIN A1C
Hgb A1c MFr Bld: 9.1 % — ABNORMAL HIGH (ref 4.8–5.6)
Mean Plasma Glucose: 214.47 mg/dL

## 2020-05-25 LAB — HIV ANTIBODY (ROUTINE TESTING W REFLEX): HIV Screen 4th Generation wRfx: REACTIVE — AB

## 2020-05-25 SURGERY — CYSTOSCOPY, WITH STENT INSERTION
Anesthesia: General | Site: Ureter | Laterality: Left

## 2020-05-25 MED ORDER — SODIUM CHLORIDE 0.9 % IV SOLN: INTRAVENOUS | Status: DC

## 2020-05-25 MED ORDER — DEXAMETHASONE SODIUM PHOSPHATE 10 MG/ML IJ SOLN
INTRAMUSCULAR | Status: AC
  Filled 2020-05-25: qty 1

## 2020-05-25 MED ORDER — MEPERIDINE HCL 50 MG/ML IJ SOLN: 6.2500 mg | INTRAMUSCULAR | Status: DC | PRN

## 2020-05-25 MED ORDER — MORPHINE SULFATE (PF) 2 MG/ML IV SOLN: 2.0000 mg | INTRAVENOUS | Status: DC | PRN

## 2020-05-25 MED ORDER — SODIUM CHLORIDE 0.9 % IV BOLUS (SEPSIS)
1000.0000 mL | Freq: Once | INTRAVENOUS | Status: AC
  Administered 2020-05-25: 1000 mL via INTRAVENOUS

## 2020-05-25 MED ORDER — CHLORHEXIDINE GLUCONATE CLOTH 2 % EX PADS
6.0000 | MEDICATED_PAD | Freq: Every day | CUTANEOUS | Status: DC
  Administered 2020-05-25 – 2020-05-27 (×3): 6 via TOPICAL

## 2020-05-25 MED ORDER — ONDANSETRON HCL 4 MG/2ML IJ SOLN: 4.0000 mg | Freq: Four times a day (QID) | INTRAMUSCULAR | Status: DC | PRN

## 2020-05-25 MED ORDER — SODIUM CHLORIDE 0.9 % IV SOLN: 1.0000 g | INTRAVENOUS | Status: DC

## 2020-05-25 MED ORDER — PROPOFOL 10 MG/ML IV BOLUS
INTRAVENOUS | Status: AC
  Filled 2020-05-25: qty 20

## 2020-05-25 MED ORDER — CIPROFLOXACIN IN D5W 400 MG/200ML IV SOLN
400.0000 mg | Freq: Once | INTRAVENOUS | Status: AC
  Administered 2020-05-25: 400 mg via INTRAVENOUS
  Filled 2020-05-25: qty 200

## 2020-05-25 MED ORDER — ENOXAPARIN SODIUM 40 MG/0.4ML ~~LOC~~ SOLN
40.0000 mg | SUBCUTANEOUS | Status: DC
  Administered 2020-05-26 – 2020-05-27 (×2): 40 mg via SUBCUTANEOUS
  Filled 2020-05-25 (×2): qty 0.4

## 2020-05-25 MED ORDER — ACETAMINOPHEN 325 MG PO TABS: 650.0000 mg | ORAL_TABLET | Freq: Four times a day (QID) | ORAL | Status: DC | PRN

## 2020-05-25 MED ORDER — ONDANSETRON HCL 4 MG PO TABS: 4.0000 mg | ORAL_TABLET | Freq: Four times a day (QID) | ORAL | Status: DC | PRN

## 2020-05-25 MED ORDER — MIDAZOLAM HCL 5 MG/5ML IJ SOLN
INTRAMUSCULAR | Status: DC | PRN
  Administered 2020-05-25: 2 mg via INTRAVENOUS

## 2020-05-25 MED ORDER — PROPOFOL 10 MG/ML IV BOLUS
INTRAVENOUS | Status: DC | PRN
  Administered 2020-05-25: 200 mg via INTRAVENOUS

## 2020-05-25 MED ORDER — INSULIN ASPART 100 UNIT/ML ~~LOC~~ SOLN
0.0000 [IU] | SUBCUTANEOUS | Status: DC
  Administered 2020-05-25: 5 [IU] via SUBCUTANEOUS
  Administered 2020-05-25: 3 [IU] via SUBCUTANEOUS
  Administered 2020-05-26: 2 [IU] via SUBCUTANEOUS
  Administered 2020-05-26 (×3): 1 [IU] via SUBCUTANEOUS
  Administered 2020-05-26: 2 [IU] via SUBCUTANEOUS
  Administered 2020-05-26 – 2020-05-27 (×4): 1 [IU] via SUBCUTANEOUS
  Filled 2020-05-25: qty 0.09

## 2020-05-25 MED ORDER — FENTANYL CITRATE (PF) 250 MCG/5ML IJ SOLN
INTRAMUSCULAR | Status: AC
  Filled 2020-05-25: qty 5

## 2020-05-25 MED ORDER — ONDANSETRON HCL 4 MG/2ML IJ SOLN
INTRAMUSCULAR | Status: DC | PRN
  Administered 2020-05-25: 4 mg via INTRAVENOUS

## 2020-05-25 MED ORDER — ONDANSETRON HCL 4 MG/2ML IJ SOLN
INTRAMUSCULAR | Status: AC
  Filled 2020-05-25: qty 2

## 2020-05-25 MED ORDER — HYDROMORPHONE HCL 1 MG/ML IJ SOLN
1.0000 mg | Freq: Once | INTRAMUSCULAR | Status: AC
  Administered 2020-05-25: 1 mg via INTRAVENOUS
  Filled 2020-05-25: qty 1

## 2020-05-25 MED ORDER — MIDAZOLAM HCL 2 MG/2ML IJ SOLN
INTRAMUSCULAR | Status: AC
  Filled 2020-05-25: qty 2

## 2020-05-25 MED ORDER — ACETAMINOPHEN 650 MG RE SUPP: 650.0000 mg | Freq: Four times a day (QID) | RECTAL | Status: DC | PRN

## 2020-05-25 MED ORDER — SODIUM CHLORIDE 0.9 % IV SOLN
1.0000 g | INTRAVENOUS | Status: DC
  Administered 2020-05-25 – 2020-05-27 (×3): 1 g via INTRAVENOUS
  Filled 2020-05-25 (×2): qty 10
  Filled 2020-05-25: qty 1

## 2020-05-25 MED ORDER — PROMETHAZINE HCL 25 MG/ML IJ SOLN: 6.2500 mg | INTRAMUSCULAR | Status: DC | PRN

## 2020-05-25 MED ORDER — LIDOCAINE HCL (CARDIAC) PF 50 MG/5ML IV SOSY
PREFILLED_SYRINGE | INTRAVENOUS | Status: DC | PRN
  Administered 2020-05-25: 100 mg via INTRAVENOUS

## 2020-05-25 MED ORDER — HYDROCODONE-ACETAMINOPHEN 5-325 MG PO TABS: 1.0000 | ORAL_TABLET | Freq: Four times a day (QID) | ORAL | Status: DC | PRN

## 2020-05-25 MED ORDER — FENTANYL CITRATE (PF) 100 MCG/2ML IJ SOLN
INTRAMUSCULAR | Status: DC | PRN
  Administered 2020-05-25: 50 ug via INTRAVENOUS
  Administered 2020-05-25: 100 ug via INTRAVENOUS

## 2020-05-25 MED ORDER — IOHEXOL 300 MG/ML  SOLN
INTRAMUSCULAR | Status: DC | PRN
  Administered 2020-05-25: 5 mL

## 2020-05-25 MED ORDER — FENTANYL CITRATE (PF) 100 MCG/2ML IJ SOLN: 25.0000 ug | INTRAMUSCULAR | Status: DC | PRN

## 2020-05-25 MED ORDER — SODIUM CHLORIDE 0.9 % IV SOLN
INTRAVENOUS | Status: DC | PRN
Start: 2020-05-25 — End: 2020-05-25

## 2020-05-25 MED ORDER — SODIUM CHLORIDE 0.9 % IR SOLN
Status: DC | PRN
  Administered 2020-05-25: 1000 mL

## 2020-05-25 SURGICAL SUPPLY — 14 items
BAG URO CATCHER STRL LF (MISCELLANEOUS) ×3 IMPLANT
CATH INTERMIT  6FR 70CM (CATHETERS) ×3 IMPLANT
CLOTH BEACON ORANGE TIMEOUT ST (SAFETY) ×3 IMPLANT
GLOVE BIO SURGEON STRL SZ7.5 (GLOVE) ×3 IMPLANT
GOWN STRL REUS W/TWL LRG LVL3 (GOWN DISPOSABLE) ×6 IMPLANT
GOWN STRL REUS W/TWL XL LVL3 (GOWN DISPOSABLE) ×3 IMPLANT
GUIDEWIRE STR DUAL SENSOR (WIRE) ×3 IMPLANT
KIT TURNOVER KIT A (KITS) IMPLANT
MANIFOLD NEPTUNE II (INSTRUMENTS) ×3 IMPLANT
PACK CYSTO (CUSTOM PROCEDURE TRAY) ×3 IMPLANT
STENT URET 6FRX26 CONTOUR (STENTS) ×3 IMPLANT
TUBING CONNECTING 10 (TUBING) ×2 IMPLANT
TUBING CONNECTING 10' (TUBING) ×1
TUBING UROLOGY SET (TUBING) IMPLANT

## 2020-05-25 NOTE — H&P (Signed)
History and Physical    Philip Caldwell [id number] DOB: Apr 23, 1966 DOA: 05/24/2020  PCP: [name], MD  Patient coming from: Home  I have personally briefly reviewed patient's old medical records in [place]  Chief Complaint: Flank pain, fever  HPI: Philip Caldwell is a 54 y.o. male with medical history significant of DM2, HTN.  Pt presents to ED at [facility] with c/o flank pain and fever.  Pt had kidney stone in March that was 2-37m.  CT noted another stone in lower pole of L kidney at that time.  Pain lasted a few days then resolved (presumably passed).  June 24th, started having R flank pain again.  7PM had fever and chills at home and T was 101, had nausea without vomiting, came in to ED.   ED Course: UA neg.  Tm 100.1.  CT shows 636mL ureteral stone with hydronephrosis and perinephric / peri ureter stranding concerning for infection.  WBC 14k.  Pt given cipro.  [facility]   Review of Systems: As per HPI, otherwise all review of systems negative.  Past Medical History:  Diagnosis Date  . A-fib ([place]  . Arm pain, central    with intermittent numbness  . Diabetes mellitus without complication ([place]  . Hypertension   . Neck pain   . Renal disorder   . Tendonitis     Past Surgical History:  Procedure Laterality Date  . COLONOSCOPY N/A 10/08/2015   Procedure: COLONOSCOPY;  Surgeon: NaRogene HoustonMD;  Location: [facility];  Service: Endoscopy;  Laterality: N/A;  1030  . NERVE SURGERY       reports that he has never smoked. He has never used smokeless tobacco. He reports that he does not drink alcohol and does not use drugs.  Allergies  Allergen Reactions  . Lisinopril Cough  . Penicillins Rash    Has patient had a PCN reaction causing immediate rash, facial/tongue/throat swelling, SOB or lightheadedness with hypotension: Yes Has patient had a PCN reaction causing severe rash involving mucus membranes  or skin necrosis: No Has patient had a PCN reaction that required hospitalization No Has patient had a PCN reaction occurring within the last 10 years: No If all of the above answers are "NO", then may proceed with Cephalosporin use.      Family History  Problem Relation Age of Onset  . Dementia Mother   . Cancer Father   . Heart attack Brother      Prior to Admission medications   Medication Sig Start Date End Date Taking? Authorizing Provider  ACCU-CHEK AVIVA PLUS test strip CHECK BLOOD SUGAR ONCE DAILY 05/05/20   TaElvia Collum, DO  Accu-Chek Softclix Lancets lancets USE TO OBTAIN A BLOOD SPECIMEN TO TEST BLOOD SUGAR UP TO 4 TIMES A DAY 10/15/19   LuMikey KirschnerMD  blood glucose meter kit and supplies KIT Dispense based on patient and insurance preference. Use up to four times daily as directed. (FOR ICD-9 250.00, 250.01). 07/22/18   MeKathie DikeMD  blood glucose meter kit and supplies KIT Dispense based on patient and insurance preference. Use up to test glucose daily.  (FOR ICD - 10) 11/27/18   LuMikey KirschnerMD  hydrocortisone 2.5 % ointment Apply bid to rash 03/17/20   LuMikey KirschnerMD  losartan (COZAAR) 25 MG tablet Take 2 tablets po qd 03/17/20   LuMikey Kirschner  MD  metFORMIN (GLUCOPHAGE) 500 MG tablet TAKE 1 TABLET (500 MG TOTAL) BY MOUTH 2 (TWO) TIMES DAILY WITH A MEAL. 05/22/20   Kathyrn Drown, MD    Physical Exam: Vitals:   05/24/20 2032 05/25/20 0100 05/25/20 0230 05/25/20 0428  BP:  124/89 127/79 (!) 135/91  Pulse:  88 87 88  Resp:  16 20 18   Temp:  98.8 F (37.1 C)  98.9 F (37.2 C)  TempSrc:    Oral  SpO2:  95% 96% 96%  Weight: 88.5 kg     Height: 5' 9"  (1.753 m)       Constitutional: NAD, calm, comfortable Eyes: PERRL, lids and conjunctivae normal ENMT: Mucous membranes are moist. Posterior pharynx clear of any exudate or lesions.Normal dentition.  Neck: normal, supple, no masses, no thyromegaly Respiratory: clear to auscultation  bilaterally, no wheezing, no crackles. Normal respiratory effort. No accessory muscle use.  Cardiovascular: Regular rate and rhythm, no murmurs / rubs / gallops. No extremity edema. 2+ pedal pulses. No carotid bruits.  Abdomen: no tenderness, no masses palpated. No hepatosplenomegaly. Bowel sounds positive.  Musculoskeletal: no clubbing / cyanosis. No joint deformity upper and lower extremities. Good ROM, no contractures. Normal muscle tone.  Skin: no rashes, lesions, ulcers. No induration Neurologic: CN 2-12 grossly intact. Sensation intact, DTR normal. Strength 5/5 in all 4.  Psychiatric: Normal judgment and insight. Alert and oriented x 3. Normal mood.    Labs on Admission: I have personally reviewed following labs and imaging studies  CBC: Recent Labs  Lab 05/24/20 2121  WBC 14.4*  HGB 14.3  HCT 40.2  MCV 82.0  PLT 025   Basic Metabolic Panel: Recent Labs  Lab 05/24/20 2121  NA 133*  K 4.5  CL 99  CO2 24  GLUCOSE 225*  BUN 19  CREATININE 2.43*  CALCIUM 9.2   GFR: Estimated Creatinine Clearance: 37.8 mL/min (A) (by C-G formula based on SCr of 2.43 mg/dL (H)). Liver Function Tests: No results for input(s): AST, ALT, ALKPHOS, BILITOT, PROT, ALBUMIN in the last 168 hours. Recent Labs  Lab 05/24/20 2121  LIPASE 42   No results for input(s): AMMONIA in the last 168 hours. Coagulation Profile: No results for input(s): INR, PROTIME in the last 168 hours. Cardiac Enzymes: No results for input(s): CKTOTAL, CKMB, CKMBINDEX, TROPONINI in the last 168 hours. BNP (last 3 results) No results for input(s): PROBNP in the last 8760 hours. HbA1C: No results for input(s): HGBA1C in the last 72 hours. CBG: No results for input(s): GLUCAP in the last 168 hours. Lipid Profile: No results for input(s): CHOL, HDL, LDLCALC, TRIG, CHOLHDL, LDLDIRECT in the last 72 hours. Thyroid Function Tests: No results for input(s): TSH, T4TOTAL, FREET4, T3FREE, THYROIDAB in the last 72  hours. Anemia Panel: No results for input(s): VITAMINB12, FOLATE, FERRITIN, TIBC, IRON, RETICCTPCT in the last 72 hours. Urine analysis:    Component Value Date/Time   COLORURINE YELLOW 05/24/2020 2108   APPEARANCEUR CLEAR 05/24/2020 2108   LABSPEC 1.014 05/24/2020 2108   PHURINE 5.0 05/24/2020 2108   GLUCOSEU 50 (A) 05/24/2020 2108   HGBUR NEGATIVE 05/24/2020 2108   BILIRUBINUR NEGATIVE 05/24/2020 2108   BILIRUBINUR + 12/19/2019 1106   KETONESUR NEGATIVE 05/24/2020 2108   PROTEINUR NEGATIVE 05/24/2020 2108   NITRITE NEGATIVE 05/24/2020 2108   LEUKOCYTESUR NEGATIVE 05/24/2020 2108    Radiological Exams on Admission: CT Renal Stone Study  Result Date: 05/25/2020 CLINICAL DATA:  Left flank pain and fever EXAM: CT ABDOMEN AND PELVIS  WITHOUT CONTRAST TECHNIQUE: Multidetector CT imaging of the abdomen and pelvis was performed following the standard protocol without IV contrast. COMPARISON:  01/31/2020 FINDINGS: Lower chest: Bandlike areas of opacity in the lung bases compatible with subsegmental atelectasis or scarring. More dependent atelectatic changes posteriorly. Multiple small sub 5 mm nodules again seen in the lung bases. No new or concerning pulmonary nodules. Normal heart size. No pericardial effusion. Hepatobiliary: Diffuse hepatic hypoattenuation compatible with hepatic steatosis. Sparing along the gallbladder fossa. No visible focal liver lesion on this noncontrast CT examination. No gallbladder wall thickening or biliary dilatation. No visible calcified gallstones. Pancreas: Partial fatty replacement of the pancreas without inflammation, ductal dilatation or discernible pancreatic lesion. Spleen: Normal in size without focal abnormality. Small accessory splenule anteriorly. Adrenals/Urinary Tract: No concerning adrenal lesions. Mild asymmetric left hydronephrosis with a 6 mm calculus at the left ureteropelvic junction. Mildly increased left perinephric and periureteral stranding is  present as well. More mild right perinephric stranding. Stable large 13 cm fluid attenuation cyst arising from the lower pole right kidney with a smaller adjacent 3 cm cyst as well. No concerning renal lesions are visible. Urinary bladder is largely decompressed at the time of exam and therefore poorly evaluated by CT imaging. Mild perivesicular hazy stranding is present. Wall thickening could be underdistention or reflective of cystitis. Stomach/Bowel: Distal esophagus, stomach and duodenal sweep are unremarkable. No small bowel wall thickening or dilatation. No evidence of obstruction. A normal appendix is visualized. No colonic dilatation or wall thickening. Scattered colonic diverticula without focal inflammation to suggest diverticulitis. Vascular/Lymphatic: No significant vascular findings are present. No enlarged abdominal or pelvic lymph nodes. Reproductive: Mild prostatomegaly, otherwise unremarkable prostate and seminal vesicles. Other: No abdominopelvic free fluid or free gas. No bowel containing hernias. Musculoskeletal: No acute osseous abnormality or suspicious osseous lesion. Mild degenerative changes in the spine most pronounced at L5-S1. Additional degenerative changes in the hips and pelvis. IMPRESSION: 1. Mild asymmetric left hydronephrosis with a 6 mm calculus at the left ureteropelvic junction. 2. Mildly increased left perinephric and periureteral stranding is present as well with periureteral stranding beyond the level of the obstructive calculus. In combination with some mild perivesicular hazy stranding and wall thickening versus underdistention, and in the setting of fever, could reflect a superimposed cystitis with ascending tract infection. Correlate with urinalysis. 3. Stable renal cysts. 4. Multiple stable sub 5 mm pulmonary nodules in the lung bases. Per imaging guidelines, no follow-up needed if patient is low-risk (and has no known or suspected primary neoplasm). Otherwise  Non-contrast chest CT can be considered in 12 months from initial time of detection (approximately March 2022) if patient is high-risk. This recommendation follows the consensus statement: Guidelines for Management of Incidental Pulmonary Nodules Detected on CT Images: From the Fleischner Society 2017; Radiology 2017; 284:228-243. 5. Hepatic steatosis. 6. Colonic diverticulosis without evidence of diverticulitis. 7. Mild prostatomegaly. Electronically Signed   By: Lovena Le M.D.   On: 05/25/2020 00:45    EKG: Independently reviewed.  Assessment/Plan Principal Problem:   Pyohydronephrosis Active Problems:   Essential hypertension   AKI (acute kidney injury) ([place])   Type 2 diabetes mellitus without complication, without long-term current use of insulin ([place])    1. Pyohydronephrosis - 1. Got cipro in ED, will put pt on rocephin (PCN allergy but tolerated rocephin back in 2018 according to our records). 2. UCx, BCx 3. IVF: 1L bolus and 125 cc/hr 4. Dr. Gloriann Loan to take to OR for ureteral stent at 0725 it looks  like 1. NPO for procedure 2. AKI on CKD3 - 1. Presumably due to obstruction from ureteral stone 2. Repeat BMP now and again tomorrow AM 3. IVF as noted 4. Strict intake and output 3. HTN - 1. Holding home BP meds until we get source control of infection and make sure he doesn't go septic 4. DM2 - 1. Sensitive SSI Q4H while NPO  DVT prophylaxis: Lovenox (start at 1300) Code Status: Full Family Communication: Wife at bedside Disposition Plan: Home after stent placement and pt treated for pyelonephritis Consults called: Dr. Gloriann Loan called by EDP, pt on OR schedule for 0725 this morning Admission status: Admit to inpatient  Severity of Illness: The appropriate patient status for this patient is INPATIENT. Inpatient status is judged to be reasonable and necessary in order to provide the required intensity of service to ensure the patient's safety. The patient's presenting symptoms,  physical exam findings, and initial radiographic and laboratory data in the context of their chronic comorbidities is felt to place them at high risk for further clinical deterioration. Furthermore, it is not anticipated that the patient will be medically stable for discharge from the hospital within 2 midnights of admission. The following factors support the patient status of inpatient.   IP status for pyohydronephrosis requiring operative intervention.   * I certify that at the point of admission it is my clinical judgment that the patient will require inpatient hospital care spanning beyond 2 midnights from the point of admission due to high intensity of service, high risk for further deterioration and high frequency of surveillance required.*    Harlen Danford M. DO [facility]  How to contact the [facility] Attending or Consulting provider [place] or covering provider during after hours [place], for this patient?  1. Check the care team in [facility] and look for a) attending/consulting [facility] provider listed and b) the [facility] team listed 2. Log into [email or web address]  Amion Physician Scheduling and messaging for groups and whole hospitals  On call and physician scheduling software for group practices, residents, hospitalists and other medical providers for call, clinic, rotation and shift schedules. OnCall [employer] is a hospital-wide system for scheduling doctors and paging doctors on call. EasyPlot is for scientific plotting and data analysis.  [email or web address]  and use [place]'s universal password to access. If you do not have the password, please contact the hospital operator.  3. Locate the [facility] provider you are looking for under [facility] and page to a number that you can be directly reached. 4. If you still have difficulty reaching the provider, please page the [facility] (Director on Call) for the Hospitalists listed on amion for assistance.  05/25/2020, 5:26 AM

## 2020-05-25 NOTE — Anesthesia Preprocedure Evaluation (Addendum)
Anesthesia Evaluation  Patient identified by MRN, date of birth, ID band Patient awake    Reviewed: Allergy & Precautions, NPO status , Patient's Chart, lab work & pertinent test results  Airway Mallampati: II  TM Distance: >3 FB Neck ROM: Full    Dental no notable dental hx. (+) Dental Advisory Given, Teeth Intact   Pulmonary neg pulmonary ROS,    Pulmonary exam normal breath sounds clear to auscultation       Cardiovascular hypertension, Pt. on medications Normal cardiovascular exam Rhythm:Regular Rate:Normal     Neuro/Psych negative neurological ROS     GI/Hepatic negative GI ROS, Neg liver ROS,   Endo/Other  diabetes  Renal/GU Renal disease     Musculoskeletal negative musculoskeletal ROS (+)   Abdominal   Peds  Hematology negative hematology ROS (+)   Anesthesia Other Findings   Reproductive/Obstetrics                            Anesthesia Physical Anesthesia Plan  ASA: III  Anesthesia Plan: General   Post-op Pain Management:    Induction: Intravenous  PONV Risk Score and Plan: 4 or greater and Ondansetron, Treatment may vary due to age or medical condition and Midazolam  Airway Management Planned: LMA  Additional Equipment: None  Intra-op Plan:   Post-operative Plan: Extubation in OR  Informed Consent: I have reviewed the patients History and Physical, chart, labs and discussed the procedure including the risks, benefits and alternatives for the proposed anesthesia with the patient or authorized representative who has indicated his/her understanding and acceptance.     Dental advisory given  Plan Discussed with: CRNA  Anesthesia Plan Comments:         Anesthesia Quick Evaluation

## 2020-05-25 NOTE — Anesthesia Postprocedure Evaluation (Signed)
Anesthesia Post Note  Patient: Philip Caldwell  Procedure(s) Performed: CYSTOSCOPY WITH STENT PLACEMENT retrograde pylegram (Left Ureter)     Patient location during evaluation: PACU Anesthesia Type: General Level of consciousness: sedated and patient cooperative Pain management: pain level controlled Vital Signs Assessment: post-procedure vital signs reviewed and stable Respiratory status: spontaneous breathing Cardiovascular status: stable Anesthetic complications: no   No complications documented.  Last Vitals:  Vitals:   05/25/20 0854 05/25/20 0932  BP: 119/89 132/84  Pulse: 89 85  Resp: 12 20  Temp: 36.7 C 36.5 C  SpO2: 97% 95%    Last Pain:  Vitals:   05/25/20 0932  TempSrc: Oral  PainSc: 0-No pain                 Nolon Nations

## 2020-05-25 NOTE — ED Notes (Signed)
Patient transported to CT 

## 2020-05-25 NOTE — Consult Note (Signed)
H&P Physician requesting consult: Philip Caldwell  Chief Complaint: Left ureteral stone, fever  History of Present Illness: 54 year old male with a history of nephrolithiasis.  He presented to the emergency department with fever, tachycardia, AKI, leukocytosis.  Urinalysis was not particularly suggestive of infection but he had left flank pain and a CT scan was performed that revealed a left ureteropelvic junction calculus, 6 mm, with mild upstream hydronephrosis.  Pain is improved this morning.  Vital signs have improved.  Past Medical History:  Diagnosis Date  . A-fib ([place])   . Arm pain, central    with intermittent numbness  . Diabetes mellitus without complication ([place])   . Hypertension   . Neck pain   . Renal disorder   . Tendonitis    Past Surgical History:  Procedure Laterality Date  . COLONOSCOPY N/A 10/08/2015   Procedure: COLONOSCOPY;  Surgeon: Philip Houston, MD;  Location: [facility];  Service: Endoscopy;  Laterality: N/A;  1030  . NERVE SURGERY      Home Medications:  (Not in a hospital admission)  Allergies:  Allergies  Allergen Reactions  . Lisinopril Cough  . Penicillins Rash    Has patient had a PCN reaction causing immediate rash, facial/tongue/throat swelling, SOB or lightheadedness with hypotension: Yes Has patient had a PCN reaction causing severe rash involving mucus membranes or skin necrosis: No Has patient had a PCN reaction that required hospitalization No Has patient had a PCN reaction occurring within the last 10 years: No If all of the above answers are "NO", then may proceed with Cephalosporin use.      Family History  Problem Relation Age of Onset  . Dementia Mother   . Cancer Father   . Heart attack Brother    Social History:  reports that he has never smoked. He has never used smokeless tobacco. He reports that he does not drink alcohol and does not use drugs.  ROS: A complete review of systems was performed.  All systems are  negative except for pertinent findings as noted. ROS   Physical Exam:  Vital signs in last 24 hours: Temp:  [98.8 F (37.1 C)-100.3 F (37.9 C)] 98.9 F (37.2 C) (06/27 0428) Pulse Rate:  [86-114] 86 (06/27 0600) Resp:  [16-20] 19 (06/27 0600) BP: (124-146)/(79-98) 131/89 (06/27 0600) SpO2:  [95 %-98 %] 98 % (06/27 0600) Weight:  [88.5 kg] 88.5 kg (06/26 2032) General:  Alert and oriented, No acute distress HEENT: Normocephalic, atraumatic Neck: No JVD or lymphadenopathy Cardiovascular: Regular rate and rhythm Lungs: Regular rate and effort Abdomen: Soft, nontender, nondistended, no abdominal masses Back: No CVA tenderness Extremities: No edema Neurologic: Grossly intact  Laboratory Data:  Results for orders placed or performed during the hospital encounter of 05/24/20 (from the past 24 hour(s))  Urinalysis, Routine w reflex microscopic     Status: Abnormal   Collection Time: 05/24/20  9:08 PM  Result Value Ref Range   Color, Urine YELLOW YELLOW   APPearance CLEAR CLEAR   Specific Gravity, Urine 1.014 1.005 - 1.030   pH 5.0 5.0 - 8.0   Glucose, UA 50 (A) NEGATIVE mg/dL   Hgb urine dipstick NEGATIVE NEGATIVE   Bilirubin Urine NEGATIVE NEGATIVE   Ketones, ur NEGATIVE NEGATIVE mg/dL   Protein, ur NEGATIVE NEGATIVE mg/dL   Nitrite NEGATIVE NEGATIVE   Leukocytes,Ua NEGATIVE NEGATIVE  CBC     Status: Abnormal   Collection Time: 05/24/20  9:21 PM  Result Value Ref Range   WBC 14.4 (  H) 4.0 - 10.5 K/uL   RBC 4.90 4.22 - 5.81 MIL/uL   Hemoglobin 14.3 13.0 - 17.0 g/dL   HCT 40.2 39 - 52 %   MCV 82.0 80.0 - 100.0 fL   MCH 29.2 26.0 - 34.0 pg   MCHC 35.6 30.0 - 36.0 g/dL   RDW 13.5 11.5 - 15.5 %   Platelets 331 150 - 400 K/uL   nRBC 0.0 0.0 - 0.2 %  Basic metabolic panel     Status: Abnormal   Collection Time: 05/24/20  9:21 PM  Result Value Ref Range   Sodium 133 (L) 135 - 145 mmol/L   Potassium 4.5 3.5 - 5.1 mmol/L   Chloride 99 98 - 111 mmol/L   CO2 24 22 - 32 mmol/L    Glucose, Bld 225 (H) 70 - 99 mg/dL   BUN 19 6 - 20 mg/dL   Creatinine, Ser 2.43 (H) 0.61 - 1.24 mg/dL   Calcium 9.2 8.9 - 10.3 mg/dL   GFR calc non Af Amer 29 (L) >60 mL/min   GFR calc Af Amer 33 (L) >60 mL/min   Anion gap 10 5 - 15  Lipase, blood     Status: None   Collection Time: 05/24/20  9:21 PM  Result Value Ref Range   Lipase 42 11 - 51 U/L  Culture, blood (routine x 2)     Status: None (Preliminary result)   Collection Time: 05/25/20 12:14 AM   Specimen: BLOOD RIGHT HAND  Result Value Ref Range   Specimen Description BLOOD RIGHT HAND    Special Requests      BLOOD RIGHT HAND Blood Culture adequate volume Performed at [facility], [address] [place], [place] [address]    Culture PENDING    Report Status PENDING   Culture, blood (routine x 2)     Status: None (Preliminary result)   Collection Time: 05/25/20 12:15 AM   Specimen: Right Antecubital; Blood  Result Value Ref Range   Specimen Description RIGHT ANTECUBITAL    Special Requests      BOTTLES DRAWN AEROBIC AND ANAEROBIC Blood Culture adequate volume Performed at [facility], [address], [place], [place] [address]    Culture PENDING    Report Status PENDING   SARS Coronavirus 2 by RT PCR (hospital order, performed in [place] hospital lab) Nasopharyngeal Nasopharyngeal Swab     Status: None   Collection Time: 05/25/20  2:00 AM   Specimen: Nasopharyngeal Swab  Result Value Ref Range   SARS Coronavirus 2 NEGATIVE NEGATIVE   Recent Results (from the past 240 hour(s))  Culture, blood (routine x 2)     Status: None (Preliminary result)   Collection Time: 05/25/20 12:14 AM   Specimen: BLOOD RIGHT HAND  Result Value Ref Range Status   Specimen Description BLOOD RIGHT HAND  Final   Special Requests   Final    BLOOD RIGHT HAND Blood Culture adequate volume Performed at [facility], [address], [place], [place] [address]    Culture PENDING  Incomplete   Report Status PENDING  Incomplete  Culture,  blood (routine x 2)     Status: None (Preliminary result)   Collection Time: 05/25/20 12:15 AM   Specimen: Right Antecubital; Blood  Result Value Ref Range Status   Specimen Description RIGHT ANTECUBITAL  Final   Special Requests   Final    BOTTLES DRAWN AEROBIC AND ANAEROBIC Blood Culture adequate volume Performed at [facility], [address] [place],  [place] [address]    Culture PENDING  Incomplete   Report Status PENDING  Incomplete  SARS Coronavirus 2 by RT PCR (hospital order, performed in [facility] hospital lab) Nasopharyngeal Nasopharyngeal Swab     Status: None   Collection Time: 05/25/20  2:00 AM   Specimen: Nasopharyngeal Swab  Result Value Ref Range Status   SARS Coronavirus 2 NEGATIVE NEGATIVE Final    Comment: (NOTE) SARS-CoV-2 target nucleic acids are NOT DETECTED.  The SARS-CoV-2 RNA is generally detectable in upper and lower respiratory specimens during the acute phase of infection. The lowest concentration of SARS-CoV-2 viral copies this assay can detect is 250 copies / mL. A negative result does not preclude SARS-CoV-2 infection and should not be used as the sole basis for treatment or other patient management decisions.  A negative result may occur with improper specimen collection / handling, submission of specimen other than nasopharyngeal swab, presence of viral mutation(s) within the areas targeted by this assay, and inadequate number of viral copies (<250 copies / mL). A negative result must be combined with clinical observations, patient history, and epidemiological information.  Fact Sheet for Patients:   StrictlyIdeas.no  Fact Sheet for Healthcare Providers: BankingDealers.co.za  This test is not yet approved or  cleared by the [place] FDA and has been authorized for detection and/or diagnosis of SARS-CoV-2 by FDA under an Emergency Use Authorization (EUA).  This EUA will remain in effect  (meaning this test can be used) for the duration of the COVID-19 declaration under Section 564(b)(1) of the Act, 21 U.S.C. section 360bbb-3(b)(1), unless the authorization is terminated or revoked sooner.  Performed at [facility], [address], [place], [place] [address]    Creatinine: Recent Labs    05/24/20 2121  CREATININE 2.43*   CT scan personally reviewed and is detailed in the history of present illness  Impression/Assessment:  Left ureteral calculus Ureteral obstruction secondary to calculus Renal colic Urinary tract infection with possible bacteremia Acute renal insufficiency  Plan:  Plan for urgent left ureteral stent placement.  Risk and benefits discussed.  He received ceftriaxone at 642 this morning.  Recommend continuing that until cultures return.  He will subsequently need definitive management of his stone down the road.  Marton Redwood, III 05/25/2020, 7:09 AM

## 2020-05-25 NOTE — Op Note (Signed)
Operative Note  Preoperative diagnosis:  1.  Left ureteral calculus with possible UTI  Post operative diagnosis: 1.  Left ureteral calculus with possible UTI  Procedure(s): 1.  Cystoscopy with left retrograde pyelogram and left ureteral stent placement  Surgeon: Link Snuffer, MD  Assistants: None  Anesthesia: General  Complications: None immediate  EBL: Minimal  Specimens: 1.  None  Drains/Catheters: 1.  6 X 26 double-J ureteral stent  Intraoperative findings: 1.  Normal urethra and bladder 2.  Left retrograde pyelogram revealed a filling defect at the level of the stone with upstream hydroureteronephrosis  Indication: 54 year old male presented with fever, AKI, leukocytosis, tachycardia, left ureteral calculus at the ureteropelvic junction and mild hydronephrosis.  He presents for urgent ureteral stent placement  Description of procedure:  The patient was identified and consent was obtained.  The patient was taken to the operating room and placed in the supine position.  The patient was placed under general anesthesia.  Perioperative antibiotics were administered.  The patient was placed in dorsal lithotomy.  Patient was prepped and draped in a standard sterile fashion and a timeout was performed.  A 21 French rigid cystoscope was advanced into the urethra and into the bladder.  The left distal most portion of the ureter was cannulated with an open-ended ureteral catheter.  Retrograde pyelogram was performed with the findings noted above.  A sensor wire was then advanced up to the kidney under fluoroscopic guidance.  A 6 X 26 double-J ureteral stent was advanced up to the kidney under fluoroscopic guidance.  The wire was withdrawn and fluoroscopy confirmed good proximal placement and direct visualization confirmed a good coil within the bladder.  The bladder was drained and the scope withdrawn.  This concluded the operation.  Patient tolerated procedure well and was stable  postoperatively.  Plan: Continue broad-spectrum antibiotics until cultures return.  Appreciate hospitalist assistance.  He will need definitive management of the stone down the road.

## 2020-05-25 NOTE — Progress Notes (Signed)
PROGRESS NOTE  FREDRIC SLABACH IRW:[id number] DOB: 06/27/1966 DOA: 05/24/2020 PCP: [name], MD  Brief History   54 year old man presented to the emergency department with complaint of flank pain and fever. CT showed left ureteropelvic junction calculus with upstream hydronephrosis.  A & P  Left ureteral calculus with obstruction, renal colic, complicated symptomatic UTI --Status post urgent left ureteral stent placement 6/27 --Continue empiric antibiotics, follow-up culture data.  Acute kidney injury superimposed on CKD stage IIIa --Slightly improved today.  Continue IV fluids and recheck BMP in a.m.  Essential hypertension --Stable.  Diabetes mellitus type 2 not on insulin --CBG stable. Continue SSI. Resume metformin on discharge.  Disposition Plan:  Discussion: Continue empiric antibiotics, follow-up culture data.  Continue IV fluids, check BMP in a.m.  Likely home in the next 48 hours if continues to improve.  Status is: Inpatient  Remains inpatient appropriate because:IV treatments appropriate due to intensity of illness or inability to take PO   Dispo: The patient is from: Home              Anticipated d/c is to: Home              Anticipated d/c date is: 2 days              Patient currently is not medically stable to d/c.  DVT prophylaxis: enoxaparin (LOVENOX) injection 40 mg Start: 05/26/20 0600 SCDs Start: 05/25/20 0506 Code Status: Full Family Communication: wife at bedside  Murray Hodgkins, MD  [facility] Direct contact: see [email or web address] (further directions at bottom of note if needed) 7PM-7AM contact night coverage as at bottom of note 05/25/2020, 1:40 PM  LOS: 0 days   Significant Hospital Events   . 6/26 admitted for complicated UTI, left ureteral stone with obstruction   Consults:  [facility]   Procedures:  . 6/27 left ureteral stent placement  Significant Diagnostic Tests:  . CT renal stone study mild asymmetric left hydronephrosis  with 6 mm calculus at UPJ, cystitis with ascending tract infection   Micro Data:   Blood culture 6/26  Urine culture 6/26   Antimicrobials:  .   Interval History/Subjective  Feels okay today.  No back pain right now.  Dysuria continues but is better compared to yesterday.  Objective   Vitals:  Vitals:   05/25/20 0854 05/25/20 0932  BP: 119/89 132/84  Pulse: 89 85  Resp: 12 20  Temp: 98.1 F (36.7 C) 97.7 F (36.5 C)  SpO2: 97% 95%    Exam:  Constitutional.  Appears calm and comfortable. Respiratory.  Clear to auscultation bilaterally.  No wheezes, rales or rhonchi.  Normal respiratory effort. Cardiovascular.  Regular rate and rhythm.  No murmur, rub or gallop. Psychiatric.  Grossly normal mood and affect.  Speech fluent and appropriate.  I have personally reviewed the following:   Today's Data  . Creatinine slightly improved at 2.25.  WBC down to 11.6.  Scheduled Meds: . Chlorhexidine Gluconate Cloth  6 each Topical Q0600  . [START ON 05/26/2020] enoxaparin (LOVENOX) injection  40 mg Subcutaneous Q24H  . insulin aspart  0-9 Units Subcutaneous Q4H   Continuous Infusions: . sodium chloride 125 mL/hr at 05/25/20 0452  . cefTRIAXone (ROCEPHIN)  IV 1 g (05/25/20 7619)    Principal Problem:   Obstruction of left ureteropelvic junction (UPJ) Active Problems:   Essential hypertension   AKI (acute kidney injury) ([place])   Type 2 diabetes mellitus without complication, without long-term current use of insulin (  [place])   Pyohydronephrosis   Complicated UTI (urinary tract infection)   LOS: 0 days   How to contact the [facility] Attending or Consulting provider [place] or covering provider during after hours [place], for this patient?  1. Check the care team in [facility] and look for a) attending/consulting [facility] provider listed and b) the [facility] team listed 2. Log into [email or web address] and use [place] universal password to access. If you do not have the password, please contact the  hospital operator. 3. Locate the [facility] provider you are looking for under [facility] and page to a number that you can be directly reached. 4. If you still have difficulty reaching the provider, please page the [facility] (Director on Call) for the Hospitalists listed on amion for assistance.

## 2020-05-25 NOTE — ED Provider Notes (Signed)
4:15 AM  Pt sent here for concerns for fever, leukocytosis, 6 mm calculus at the left UPJ.  Dr. Gloriann Loan with urology to take patient to the operating room in the morning for a stent.  Dr. Gloriann Loan recommends medicine admission for treatment for possible UTI as well as AKI.  He has received IV Cipro.  Will start IV fluids.  Patient to remain n.p.o.  He reports pain and nausea currently controlled.  Updated with plan.  5:09 AM Discussed patient's case with hospitatlist, Dr. Alcario Drought.  I have recommended admission and patient (and family if present) agree with this plan. Admitting physician will place admission orders.   I reviewed all nursing notes, vitals, pertinent previous records and reviewed/interpreted all EKGs, lab and urine results, imaging (as available).     Landyn Lorincz, Delice Bison, DO 05/25/20 [phone]

## 2020-05-25 NOTE — Transfer of Care (Signed)
Immediate Anesthesia Transfer of Care Note  Patient: Philip Caldwell  Procedure(s) Performed: CYSTOSCOPY WITH STENT PLACEMENT retrograde pylegram (Left Ureter)  Patient Location: PACU  Anesthesia Type:General  Level of Consciousness: awake, alert , oriented and patient cooperative  Airway & Oxygen Therapy: Patient Spontanous Breathing and Patient connected to face mask oxygen  Post-op Assessment: Report given to RN, Post -op Vital signs reviewed and stable and Patient moving all extremities X 4  Post vital signs: stable  Last Vitals:  Vitals Value Taken Time  BP 107/72 05/25/20 0815  Temp 36.7 C 05/25/20 0815  Pulse 98 05/25/20 0818  Resp 12 05/25/20 0818  SpO2 100 % 05/25/20 0818  Vitals shown include unvalidated device data.  Last Pain:  Vitals:   05/25/20 0428  TempSrc: Oral  PainSc: 0-No pain         Complications: No complications documented.

## 2020-05-25 NOTE — Anesthesia Procedure Notes (Signed)
Procedure Name: LMA Insertion Date/Time: 05/25/2020 7:49 AM Performed by: Lissa Morales, CRNA Pre-anesthesia Checklist: Patient identified, Emergency Drugs available, Suction available and Patient being monitored Patient Re-evaluated:Patient Re-evaluated prior to induction Oxygen Delivery Method: Circle system utilized Preoxygenation: Pre-oxygenation with 100% oxygen Induction Type: IV induction LMA: LMA with gastric port inserted LMA Size: 5.0 Tube type: Oral Number of attempts: 1 Airway Equipment and Method: Oral airway Placement Confirmation: positive ETCO2 Tube secured with: Tape Dental Injury: Teeth and Oropharynx as per pre-operative assessment

## 2020-05-25 NOTE — Plan of Care (Signed)
Discussed with patient plan of care for the evening, pain management and visitor policy with some teach back displayed.

## 2020-05-25 NOTE — ED Provider Notes (Signed)
[facility] EMERGENCY DEPARTMENT Provider Note   CSN: [id number] Arrival date & time: 05/24/20  2009   Time seen 11:48 PM  History Chief Complaint  Patient presents with  . Flank Pain    left    Philip Caldwell is a 54 y.o. male.  HPI   Patient states he was seen on March 4 when he was diagnosed with a kidney stone.  Reviewing his CT scan he had a 2 to 3 mm distal left ureteral stone with mild left hydronephrosis.  There was noted to be another stone in the lower pole of the left kidney.  He states he had pain for a few days and then the pain was gone.  He states June 24 he started having left-sided flank pain again.  He states sometimes the pain radiates into his left lower quadrant but currently the pain is remaining in his left flank.  He states before the pain would come and go lasting 15 to 20 minutes but it has been constant the last 3 to 4 hours.  He states he had fever at home about 7 PM with chills and his temperature was 101.  He has had nausea without vomiting.  He states nothing he does makes the pain worse, nothing does makes it feel better.  He denies dysuria, hematuria, diarrhea, cough, or shortness of breath.  PCP [name], MD   Past Medical History:  Diagnosis Date  . A-fib ([place])   . Arm pain, central    with intermittent numbness  . Diabetes mellitus without complication ([place])   . Hypertension   . Neck pain   . Renal disorder   . Tendonitis     Patient Active Problem List   Diagnosis Date Noted  . Type 2 diabetes mellitus without complication, without long-term current use of insulin ([place]) 11/06/2018  . Dehydration 07/21/2018  . Hyponatremia 07/21/2018  . AKI (acute kidney injury) ([place]) 07/21/2018  . Essential hypertension 10/14/2014  . Chronic arthralgias of knees and hips 10/14/2014  . SHOULDER PAIN 07/15/2008  . IMPINGEMENT SYNDROME 07/15/2008  . [place] HEAD 07/15/2008    Past Surgical History:  Procedure Laterality Date  .  COLONOSCOPY N/A 10/08/2015   Procedure: COLONOSCOPY;  Surgeon: Rogene Houston, MD;  Location: [facility] ENDO SUITE;  Service: Endoscopy;  Laterality: N/A;  1030  . NERVE SURGERY         Family History  Problem Relation Age of Onset  . Dementia Mother   . Cancer Father   . Heart attack Brother     Social History   Tobacco Use  . Smoking status: Never Smoker  . Smokeless tobacco: Never Used  Vaping Use  . Vaping Use: Never used  Substance Use Topics  . Alcohol use: No  . Drug use: No    Home Medications Prior to Admission medications   Medication Sig Start Date End Date Taking? Authorizing Provider  ACCU-CHEK AVIVA PLUS test strip CHECK BLOOD SUGAR ONCE DAILY 05/05/20   Elvia Collum M, DO  Accu-Chek Softclix Lancets lancets USE TO OBTAIN A BLOOD SPECIMEN TO TEST BLOOD SUGAR UP TO 4 TIMES A DAY 10/15/19   [name], MD  blood glucose meter kit and supplies KIT Dispense based on patient and insurance preference. Use up to four times daily as directed. (FOR ICD-9 250.00, 250.01). 07/22/18   Kathie Dike, MD  blood glucose meter kit and supplies KIT Dispense based on patient and insurance preference. Use up to test  glucose daily.  (FOR ICD - 10) 11/27/18   [name], MD  hydrocortisone 2.5 % ointment Apply bid to rash 03/17/20   [name], MD  losartan (COZAAR) 25 MG tablet Take 2 tablets po qd 03/17/20   [name], MD  metFORMIN (GLUCOPHAGE) 500 MG tablet TAKE 1 TABLET (500 MG TOTAL) BY MOUTH 2 (TWO) TIMES DAILY WITH A MEAL. 05/22/20   Kathyrn Drown, MD    Allergies    Lisinopril and Penicillins  Review of Systems   Review of Systems  All other systems reviewed and are negative.   Physical Exam Updated Vital Signs BP 124/89   Pulse 88   Temp 98.8 F (37.1 C)   Resp 16   Ht _0  (1.753 m)   Wt 88.5 kg   SpO2 95%   BMI 28.80 kg/m   Physical Exam Vitals and nursing note reviewed.  Constitutional:      Appearance: Normal appearance.      Comments: Looks like he is in pain  HENT:     Head: Normocephalic and atraumatic.     Right Ear: External ear normal.     Left Ear: External ear normal.  Eyes:     Extraocular Movements: Extraocular movements intact.     Conjunctiva/sclera: Conjunctivae normal.     Pupils: Pupils are equal, round, and reactive to light.  Cardiovascular:     Rate and Rhythm: Regular rhythm. Tachycardia present.  Pulmonary:     Effort: Pulmonary effort is normal. No respiratory distress.     Breath sounds: Normal breath sounds.  Abdominal:     General: Abdomen is flat. Bowel sounds are normal.     Palpations: Abdomen is soft.     Tenderness: There is no abdominal tenderness. There is left CVA tenderness. There is no right CVA tenderness.  Musculoskeletal:        General: Normal range of motion.     Cervical back: Normal range of motion and neck supple.  Skin:    General: Skin is warm and dry.  Neurological:     General: No focal deficit present.     Mental Status: He is alert and oriented to person, place, and time.     Cranial Nerves: No cranial nerve deficit.  Psychiatric:        Mood and Affect: Mood normal.        Behavior: Behavior normal.        Thought Content: Thought content normal.     ED Results / Procedures / Treatments   Labs (all labs ordered are listed, but only abnormal results are displayed) Results for orders placed or performed during the hospital encounter of 05/24/20  Culture, blood (routine x 2)   Specimen: BLOOD RIGHT HAND  Result Value Ref Range   Specimen Description BLOOD RIGHT HAND    Special Requests      BLOOD RIGHT HAND Blood Culture adequate volume Performed at [facility], [address], [place], [place] [address]    Culture PENDING    Report Status PENDING   Culture, blood (routine x 2)   Specimen: Right Antecubital; Blood  Result Value Ref Range   Specimen Description RIGHT ANTECUBITAL    Special Requests      BOTTLES DRAWN AEROBIC AND ANAEROBIC  Blood Culture adequate volume Performed at [facility], [address] [place],  [address]    Culture PENDING    Report Status PENDING   Urinalysis, Routine w reflex microscopic  Result Value Ref Range   Color, Urine YELLOW YELLOW   APPearance CLEAR CLEAR   Specific Gravity, Urine 1.014 1.005 - 1.030   pH 5.0 5.0 - 8.0   Glucose, UA 50 (A) NEGATIVE mg/dL   Hgb urine dipstick NEGATIVE NEGATIVE   Bilirubin Urine NEGATIVE NEGATIVE   Ketones, ur NEGATIVE NEGATIVE mg/dL   Protein, ur NEGATIVE NEGATIVE mg/dL   Nitrite NEGATIVE NEGATIVE   Leukocytes,Ua NEGATIVE NEGATIVE  CBC  Result Value Ref Range   WBC 14.4 (H) 4.0 - 10.5 K/uL   RBC 4.90 4.22 - 5.81 MIL/uL   Hemoglobin 14.3 13.0 - 17.0 g/dL   HCT 40.2 39 - 52 %   MCV 82.0 80.0 - 100.0 fL   MCH 29.2 26.0 - 34.0 pg   MCHC 35.6 30.0 - 36.0 g/dL   RDW 13.5 11.5 - 15.5 %   Platelets 331 150 - 400 K/uL   nRBC 0.0 0.0 - 0.2 %  Basic metabolic panel  Result Value Ref Range   Sodium 133 (L) 135 - 145 mmol/L   Potassium 4.5 3.5 - 5.1 mmol/L   Chloride 99 98 - 111 mmol/L   CO2 24 22 - 32 mmol/L   Glucose, Bld 225 (H) 70 - 99 mg/dL   BUN 19 6 - 20 mg/dL   Creatinine, Ser 2.43 (H) 0.61 - 1.24 mg/dL   Calcium 9.2 8.9 - 10.3 mg/dL   GFR calc non Af Amer 29 (L) >60 mL/min   GFR calc Af Amer 33 (L) >60 mL/min   Anion gap 10 5 - 15  Lipase, blood  Result Value Ref Range   Lipase 42 11 - 51 U/L   Laboratory interpretation all normal except leukocytosis, mild worsening of his underlying renal insufficiency, hyperglycemia   EKG None  Radiology CT Renal Stone Study  Result Date: 05/25/2020 CLINICAL DATA:  Left flank pain and fever EXAM: CT ABDOMEN AND PELVIS WITHOUT CONTRAST TECHNIQUE: Multidetector CT imaging of the abdomen and pelvis was performed following the standard protocol without IV contrast. COMPARISON:  01/31/2020 FINDINGS: Lower chest: Bandlike areas of opacity in the lung bases compatible with subsegmental  atelectasis or scarring. More dependent atelectatic changes posteriorly. Multiple small sub 5 mm nodules again seen in the lung bases. No new or concerning pulmonary nodules. Normal heart size. No pericardial effusion. Hepatobiliary: Diffuse hepatic hypoattenuation compatible with hepatic steatosis. Sparing along the gallbladder fossa. No visible focal liver lesion on this noncontrast CT examination. No gallbladder wall thickening or biliary dilatation. No visible calcified gallstones. Pancreas: Partial fatty replacement of the pancreas without inflammation, ductal dilatation or discernible pancreatic lesion. Spleen: Normal in size without focal abnormality. Small accessory splenule anteriorly. Adrenals/Urinary Tract: No concerning adrenal lesions. Mild asymmetric left hydronephrosis with a 6 mm calculus at the left ureteropelvic junction. Mildly increased left perinephric and periureteral stranding is present as well. More mild right perinephric stranding. Stable large 13 cm fluid attenuation cyst arising from the lower pole right kidney with a smaller adjacent 3 cm cyst as well. No concerning renal lesions are visible. Urinary bladder is largely decompressed at the time of exam and therefore poorly evaluated by CT imaging. Mild perivesicular hazy stranding is present. Wall thickening could be underdistention or reflective of cystitis. Stomach/Bowel: Distal esophagus, stomach and duodenal sweep are unremarkable. No small bowel wall thickening or dilatation. No evidence of obstruction. A normal appendix is visualized. No colonic dilatation or wall thickening. Scattered colonic diverticula without focal inflammation to suggest diverticulitis. Vascular/Lymphatic: No significant  vascular findings are present. No enlarged abdominal or pelvic lymph nodes. Reproductive: Mild prostatomegaly, otherwise unremarkable prostate and seminal vesicles. Other: No abdominopelvic free fluid or free gas. No bowel containing hernias.  Musculoskeletal: No acute osseous abnormality or suspicious osseous lesion. Mild degenerative changes in the spine most pronounced at L5-S1. Additional degenerative changes in the hips and pelvis. IMPRESSION: 1. Mild asymmetric left hydronephrosis with a 6 mm calculus at the left ureteropelvic junction. 2. Mildly increased left perinephric and periureteral stranding is present as well with periureteral stranding beyond the level of the obstructive calculus. In combination with some mild perivesicular hazy stranding and wall thickening versus underdistention, and in the setting of fever, could reflect a superimposed cystitis with ascending tract infection. Correlate with urinalysis. 3. Stable renal cysts. 4. Multiple stable sub 5 mm pulmonary nodules in the lung bases. Per imaging guidelines, no follow-up needed if patient is low-risk (and has no known or suspected primary neoplasm). Otherwise Non-contrast chest CT can be considered in 12 months from initial time of detection (approximately March 2022) if patient is high-risk. This recommendation follows the consensus statement: Guidelines for Management of Incidental Pulmonary Nodules Detected on CT Images: From the Fleischner Society 2017; Radiology 2017; 284:228-243. 5. Hepatic steatosis. 6. Colonic diverticulosis without evidence of diverticulitis. 7. Mild prostatomegaly. Electronically Signed   By: Lovena Le M.D.   On: 05/25/2020 00:45    Procedures Procedures (including critical care time)  Medications Ordered in ED Medications  ciprofloxacin (CIPRO) IVPB 400 mg (400 mg Intravenous New Bag/Given 05/25/20 0127)  HYDROmorphone (DILAUDID) injection 1 mg (1 mg Intravenous Given 05/25/20 0018)  ondansetron (ZOFRAN) injection 4 mg (4 mg Intravenous Given 05/25/20 0018)    ED Course  I have reviewed the triage vital signs and the nursing notes.  Pertinent labs & imaging results that were available during my care of the patient were reviewed by me and  considered in my medical decision making (see chart for details).    MDM Rules/Calculators/A&P                          We discussed his test results, his urine is unexpectedly pretty normal except for some mild glucosuria.  He does have a elevation of his white count and he has some worsening of his underlying renal insufficiency.  CT was done to see if he is still trying to pass the stone he had in March or if he is passing the other stone that was seen up in his kidney in March.  Blood cultures and urine culture was added to his laboratory testing.  After reviewing his CT patient was given IV Cipro.  He  had a penicillin allergy so Rocephin was not used.  Recheck at 1:45 AM we discussed his CT results and the need to talk to urology about possible admission and stenting.  1:52 AM patient discussed with Dr. Gloriann Loan, urologist.  He has asked that we transfer the patient to [facility] long ED, he plans on seeing him there and placing a stent.  He states with the fever he will be admitted and he will decide at that time whether he is going to admit the patient or have the hospitalist admit.  Patient was made n.p.o. however he has been in the ED 6 hours so I doubt if he has had anything to eat or drink.  1:58 AM Dr. Gunnar Bulla, ED physician at [facility] ED made aware of transfer and  need to call Dr. Gloriann Loan when patient arrives.    Final Clinical Impression(s) / ED Diagnoses Final diagnoses:  Obstruction of left ureteropelvic junction (UPJ) due to stone  Fever, unspecified fever cause  Acute on chronic renal insufficiency    Rx / DC Orders   Plan transfer to [facility] long ED to be evaluated by Dr. Gloriann Loan, urologist  [name], MD, Barbette Or, MD 05/25/20 0201

## 2020-05-26 ENCOUNTER — Encounter (HOSPITAL_COMMUNITY): Payer: Self-pay | Admitting: Urology

## 2020-05-26 LAB — CBC
HCT: 35.2 % — ABNORMAL LOW (ref 39.0–52.0)
Hemoglobin: 12.7 g/dL — ABNORMAL LOW (ref 13.0–17.0)
MCH: 29.4 pg (ref 26.0–34.0)
MCHC: 36.1 g/dL — ABNORMAL HIGH (ref 30.0–36.0)
MCV: 81.5 fL (ref 80.0–100.0)
Platelets: 299 10*3/uL (ref 150–400)
RBC: 4.32 MIL/uL (ref 4.22–5.81)
RDW: 13.6 % (ref 11.5–15.5)
WBC: 9.3 10*3/uL (ref 4.0–10.5)
nRBC: 0 % (ref 0.0–0.2)

## 2020-05-26 LAB — BASIC METABOLIC PANEL
Anion gap: 6 (ref 5–15)
BUN: 18 mg/dL (ref 6–20)
CO2: 23 mmol/L (ref 22–32)
Calcium: 8.1 mg/dL — ABNORMAL LOW (ref 8.9–10.3)
Chloride: 108 mmol/L (ref 98–111)
Creatinine, Ser: 1.75 mg/dL — ABNORMAL HIGH (ref 0.61–1.24)
GFR calc Af Amer: 50 mL/min — ABNORMAL LOW (ref >60)
GFR calc non Af Amer: 43 mL/min — ABNORMAL LOW (ref >60)
Glucose, Bld: 142 mg/dL — ABNORMAL HIGH (ref 70–99)
Potassium: 4.4 mmol/L (ref 3.5–5.1)
Sodium: 137 mmol/L (ref 135–145)

## 2020-05-26 LAB — URINE CULTURE
Culture: NO GROWTH
Special Requests: NORMAL

## 2020-05-26 LAB — GLUCOSE, CAPILLARY
Glucose-Capillary: 185 mg/dL — ABNORMAL HIGH (ref 70–99)
Glucose-Capillary: 130 mg/dL — ABNORMAL HIGH (ref 70–99)
Glucose-Capillary: 146 mg/dL — ABNORMAL HIGH (ref 70–99)
Glucose-Capillary: 166 mg/dL — ABNORMAL HIGH (ref 70–99)
Glucose-Capillary: 132 mg/dL — ABNORMAL HIGH (ref 70–99)
Glucose-Capillary: 146 mg/dL — ABNORMAL HIGH (ref 70–99)

## 2020-05-26 NOTE — Plan of Care (Signed)
?  Problem: Clinical Measurements: ?Goal: Will remain free from infection ?Outcome: Progressing ?  ?

## 2020-05-26 NOTE — Progress Notes (Addendum)
PROGRESS NOTE  Philip Caldwell UDJ:[id number] DOB: 07/26/1966 DOA: 05/24/2020 PCP: [name], MD  Brief History   54 year old man presented to the emergency department with complaint of flank pain and fever. CT showed left ureteropelvic junction calculus with upstream hydronephrosis.  A & P  Left ureteral calculus with obstruction, renal colic, complicated symptomatic UTI --Status post urgent left ureteral stent placement 6/27 --Improving.  Less dysuria.  Continue ceftriaxone pending culture data.  Acute kidney injury superimposed on CKD stage IIIa --Improving.  Continue IV fluids.  At this point expect spontaneous resolution.  Essential hypertension --Remains stable.  Diabetes mellitus type 2 not on insulin --CBG remains stable. Continue SSI. Resume metformin on discharge.  HIV screening lab reactive --Discussed with patient and wife (patient wanted wife present).  No risk factors identified.  No drug use, no blood transfusions, no extramarital sex.  Hopefully false positive. --Follow-up confirmatory study and RNA quant, send out studies available in 1 to 5 days.  Disposition Plan:  Discussion: If improving.  Continue ceftriaxone, follow-up culture data, likely home 6/29 if continues to improve.  Status is: Inpatient  Remains inpatient appropriate because:IV treatments appropriate due to intensity of illness or inability to take PO   Dispo: The patient is from: Home              Anticipated d/c is to: Home              Anticipated d/c date is: 1 day              Patient currently is not medically stable to d/c.  DVT prophylaxis: enoxaparin (LOVENOX) injection 40 mg Start: 05/26/20 0600 SCDs Start: 05/25/20 0506 Code Status: Full Family Communication: wife at bedside 6/28  Philip Hodgkins, MD  [facility] Direct contact: see [email or web address] (further directions at bottom of note if needed) 7PM-7AM contact night coverage as at bottom of note 05/26/2020, 4:22 PM   LOS: 1 day   Significant Hospital Events   . 6/26 admitted for complicated UTI, left ureteral stone with obstruction   Consults:  [facility]   Procedures:  . 6/27 left ureteral stent placement  Significant Diagnostic Tests:  . CT renal stone study mild asymmetric left hydronephrosis with 6 mm calculus at UPJ, cystitis with ascending tract infection   Micro Data:   Blood culture 6/26  Urine culture 6/26 no growth   Antimicrobials:  . Ceftriaxone 6/26 >  Interval History/Subjective  Feels much better today.  Less dysuria today.  No pain elsewhere.  Objective   Vitals:  Vitals:   05/26/20 0515 05/26/20 1201  BP: 126/85 (!) 162/95  Pulse: 72 85  Resp: 16 16  Temp: 98.7 F (37.1 C) 97.9 F (36.6 C)  SpO2: 96% 97%    Exam:  Constitutional.  Appears calm, comfortable. Psychiatric.  Grossly normal mood and affect.  Speech fluent and appropriate. Cardiovascular.  Regular rate and rhythm.  No murmur, rub or gallop.  No lower extremity edema. Respiratory.  Clear to auscultation bilaterally.  No wheezes, rales or rhonchi.  Normal respiratory effort.  I have personally reviewed the following:   Today's Data  . Adequate urine output . Blood sugar stable, creatinine improving, 1.75. Marland Kitchen Leukocytosis has resolved. Marland Kitchen HIV screen reactive  Scheduled Meds: . Chlorhexidine Gluconate Cloth  6 each Topical Q0600  . enoxaparin (LOVENOX) injection  40 mg Subcutaneous Q24H  . insulin aspart  0-9 Units Subcutaneous Q4H   Continuous Infusions: . sodium chloride 125 mL/hr at  05/26/20 1131  . cefTRIAXone (ROCEPHIN)  IV Stopped (05/26/20 0600)    Principal Problem:   Obstruction of left ureteropelvic junction (UPJ) Active Problems:   Essential hypertension   AKI (acute kidney injury) ([place])   Type 2 diabetes mellitus without complication, without long-term current use of insulin ([facility])   Pyohydronephrosis   Complicated UTI (urinary tract infection)   LOS: 1 day   How to  contact the [facility] Attending or Consulting provider 7A - 7P or covering provider during after hours [place], for this patient?  1. Check the care team in [facility] and look for a) attending/consulting [facility] provider listed and b) the [facility] team listed 2. Log into [email or web address] and use [facility]'s universal password to access. If you do not have the password, please contact the hospital operator. 3. Locate the [facility] provider you are looking for under [facility] and page to a number that you can be directly reached. 4. If you still have difficulty reaching the provider, please page the [facility] (Director on Call) for the Hospitalists listed on amion for assistance.  Time spent greater than 35 minutes, greater than 50% counseling, coordination of care

## 2020-05-26 NOTE — Progress Notes (Signed)
Urology Inpatient Progress Report  Acute on chronic renal insufficiency [N28.9, N18.9] Pyohydronephrosis [N13.6] Fever, unspecified fever cause [R50.9] Obstruction of left ureteropelvic junction (UPJ) due to stone [N20.1]  Procedure(s): CYSTOSCOPY WITH STENT PLACEMENT retrograde pylegram  1 Day Post-Op   Intv/Subj: No acute events overnight. Patient is without complaint. Urine culture negative.  Blood culture pending  Principal Problem:   Obstruction of left ureteropelvic junction (UPJ) Active Problems:   Essential hypertension   AKI (acute kidney injury) ([place])   Type 2 diabetes mellitus without complication, without long-term current use of insulin ([facility])   Pyohydronephrosis   Complicated UTI (urinary tract infection)  Current Facility-Administered Medications  Medication Dose Route Frequency Provider Last Rate Last Admin  . 0.9 %  sodium chloride infusion   Intravenous Continuous Samuella Cota, MD 50 mL/hr at 05/26/20 1703 Rate Change at 05/26/20 1703  . acetaminophen (TYLENOL) tablet 650 mg  650 mg Oral Q6H PRN Etta Quill, DO       Or  . acetaminophen (TYLENOL) suppository 650 mg  650 mg Rectal Q6H PRN Etta Quill, DO      . cefTRIAXone (ROCEPHIN) 1 g in sodium chloride 0.9 % 100 mL IVPB  1 g Intravenous Q24H Jennette Kettle M, DO   Stopped at 05/26/20 0600  . Chlorhexidine Gluconate Cloth 2 % PADS 6 each  6 each Topical Q0600 Ward, Delice Bison, DO   6 each at 05/26/20 0512  . enoxaparin (LOVENOX) injection 40 mg  40 mg Subcutaneous Q24H Jennette Kettle M, DO   40 mg at 05/26/20 0510  . HYDROcodone-acetaminophen (NORCO/VICODIN) 5-325 MG per tablet 1-2 tablet  1-2 tablet Oral Q6H PRN Samuella Cota, MD      . insulin aspart (novoLOG) injection 0-9 Units  0-9 Units Subcutaneous Q4H Etta Quill, DO   1 Units at 05/26/20 1702  . morphine 2 MG/ML injection 2-4 mg  2-4 mg Intravenous Q4H PRN Samuella Cota, MD      . ondansetron [facility]) tablet 4 mg  4 mg  Oral Q6H PRN Etta Quill, DO       Or  . ondansetron [facility]) injection 4 mg  4 mg Intravenous Q6H PRN Etta Quill, DO         Objective: Vital: Vitals:   05/25/20 2124 05/26/20 0137 05/26/20 0515 05/26/20 1201  BP: (!) 146/87 (!) 145/93 126/85 (!) 162/95  Pulse: 90 77 72 85  Resp: 18 18 16 16   Temp: 99.4 F (37.4 C) 98.6 F (37 C) 98.7 F (37.1 C) 97.9 F (36.6 C)  TempSrc: Oral Oral Oral Oral  SpO2: 92% 96% 96% 97%  Weight:      Height:       I/Os: I/O last 3 completed shifts: In: 3934.8 [P.O.:540; I.V.:3044.8; IV Piggyback:350] Out: 75 [Urine:930]  Physical Exam:  General: Patient is in no apparent distress Lungs: Normal respiratory effort, chest expands symmetrically. GI: The abdomen is soft and nontender without mass. Ext: lower extremities symmetric  Lab Results: Recent Labs    05/24/20 2121 05/25/20 0833 05/26/20 0457  WBC 14.4* 11.6* 9.3  HGB 14.3 12.2* 12.7*  HCT 40.2 33.9* 35.2*   Recent Labs    05/24/20 2121 05/25/20 0833 05/26/20 0457  NA 133* 133* 137  K 4.5 4.4 4.4  CL 99 104 108  CO2 24 23 23   GLUCOSE 225* 181* 142*  BUN 19 18 18   CREATININE 2.43* 2.25* 1.75*  CALCIUM 9.2 8.1* 8.1*   No  results for input(s): LABPT, INR in the last 72 hours. No results for input(s): LABURIN in the last 72 hours. Results for orders placed or performed during the hospital encounter of 05/24/20  Urine culture     Status: None   Collection Time: 05/24/20  9:08 PM   Specimen: Urine, Random  Result Value Ref Range Status   Specimen Description   Final    URINE, RANDOM Performed at [facility], [address] [place], [place] [address]    Special Requests   Final    Normal Performed at [facility], [address], [place], [place] [address]    Culture   Final    NO GROWTH Performed at [facility], [place] [address], [place], [place] [address]    Report Status 05/26/2020 FINAL  Final  Culture, blood (routine x 2)     Status: None  (Preliminary result)   Collection Time: 05/25/20 12:14 AM   Specimen: BLOOD RIGHT HAND  Result Value Ref Range Status   Specimen Description BLOOD RIGHT HAND  Final   Special Requests   Final    BLOOD RIGHT HAND Blood Culture adequate volume Performed at [facility], [address], [place], [place] [address]    Culture PENDING  Incomplete   Report Status PENDING  Incomplete  Culture, blood (routine x 2)     Status: None (Preliminary result)   Collection Time: 05/25/20 12:15 AM   Specimen: Right Antecubital; Blood  Result Value Ref Range Status   Specimen Description RIGHT ANTECUBITAL  Final   Special Requests   Final    BOTTLES DRAWN AEROBIC AND ANAEROBIC Blood Culture adequate volume Performed at [facility], [address], [place],  [address]    Culture PENDING  Incomplete   Report Status PENDING  Incomplete  SARS Coronavirus 2 by RT PCR (hospital order, performed in [place] hospital lab) Nasopharyngeal Nasopharyngeal Swab     Status: None   Collection Time: 05/25/20  2:00 AM   Specimen: Nasopharyngeal Swab  Result Value Ref Range Status   SARS Coronavirus 2 NEGATIVE NEGATIVE Final    Comment: (NOTE) SARS-CoV-2 target nucleic acids are NOT DETECTED.  The SARS-CoV-2 RNA is generally detectable in upper and lower respiratory specimens during the acute phase of infection. The lowest concentration of SARS-CoV-2 viral copies this assay can detect is 250 copies / mL. A negative result does not preclude SARS-CoV-2 infection and should not be used as the sole basis for treatment or other patient management decisions.  A negative result may occur with improper specimen collection / handling, submission of specimen other than nasopharyngeal swab, presence of viral mutation(s) within the areas targeted by this assay, and inadequate number of viral copies (<250 copies / mL). A negative result must be combined with clinical observations, patient history, and epidemiological  information.  Fact Sheet for Patients:   StrictlyIdeas.no  Fact Sheet for Healthcare Providers: BankingDealers.co.za  This test is not yet approved or  cleared by the [place] FDA and has been authorized for detection and/or diagnosis of SARS-CoV-2 by FDA under an Emergency Use Authorization (EUA).  This EUA will remain in effect (meaning this test can be used) for the duration of the COVID-19 declaration under Section 564(b)(1) of the Act, 21 U.S.C. section 360bbb-3(b)(1), unless the authorization is terminated or revoked sooner.  Performed at [facility], [address], [place],  [address]     Studies/Results: DG C-Arm 1-60 Min-No Report  Result Date: 05/25/2020 Fluoroscopy  was utilized by the requesting physician.  No radiographic interpretation.   CT Renal Stone Study  Result Date: 05/25/2020 CLINICAL DATA:  Left flank pain and fever EXAM: CT ABDOMEN AND PELVIS WITHOUT CONTRAST TECHNIQUE: Multidetector CT imaging of the abdomen and pelvis was performed following the standard protocol without IV contrast. COMPARISON:  01/31/2020 FINDINGS: Lower chest: Bandlike areas of opacity in the lung bases compatible with subsegmental atelectasis or scarring. More dependent atelectatic changes posteriorly. Multiple small sub 5 mm nodules again seen in the lung bases. No new or concerning pulmonary nodules. Normal heart size. No pericardial effusion. Hepatobiliary: Diffuse hepatic hypoattenuation compatible with hepatic steatosis. Sparing along the gallbladder fossa. No visible focal liver lesion on this noncontrast CT examination. No gallbladder wall thickening or biliary dilatation. No visible calcified gallstones. Pancreas: Partial fatty replacement of the pancreas without inflammation, ductal dilatation or discernible pancreatic lesion. Spleen: Normal in size without focal abnormality. Small accessory splenule anteriorly.  Adrenals/Urinary Tract: No concerning adrenal lesions. Mild asymmetric left hydronephrosis with a 6 mm calculus at the left ureteropelvic junction. Mildly increased left perinephric and periureteral stranding is present as well. More mild right perinephric stranding. Stable large 13 cm fluid attenuation cyst arising from the lower pole right kidney with a smaller adjacent 3 cm cyst as well. No concerning renal lesions are visible. Urinary bladder is largely decompressed at the time of exam and therefore poorly evaluated by CT imaging. Mild perivesicular hazy stranding is present. Wall thickening could be underdistention or reflective of cystitis. Stomach/Bowel: Distal esophagus, stomach and duodenal sweep are unremarkable. No small bowel wall thickening or dilatation. No evidence of obstruction. A normal appendix is visualized. No colonic dilatation or wall thickening. Scattered colonic diverticula without focal inflammation to suggest diverticulitis. Vascular/Lymphatic: No significant vascular findings are present. No enlarged abdominal or pelvic lymph nodes. Reproductive: Mild prostatomegaly, otherwise unremarkable prostate and seminal vesicles. Other: No abdominopelvic free fluid or free gas. No bowel containing hernias. Musculoskeletal: No acute osseous abnormality or suspicious osseous lesion. Mild degenerative changes in the spine most pronounced at L5-S1. Additional degenerative changes in the hips and pelvis. IMPRESSION: 1. Mild asymmetric left hydronephrosis with a 6 mm calculus at the left ureteropelvic junction. 2. Mildly increased left perinephric and periureteral stranding is present as well with periureteral stranding beyond the level of the obstructive calculus. In combination with some mild perivesicular hazy stranding and wall thickening versus underdistention, and in the setting of fever, could reflect a superimposed cystitis with ascending tract infection. Correlate with urinalysis. 3. Stable  renal cysts. 4. Multiple stable sub 5 mm pulmonary nodules in the lung bases. Per imaging guidelines, no follow-up needed if patient is low-risk (and has no known or suspected primary neoplasm). Otherwise Non-contrast chest CT can be considered in 12 months from initial time of detection (approximately March 2022) if patient is high-risk. This recommendation follows the consensus statement: Guidelines for Management of Incidental Pulmonary Nodules Detected on CT Images: From the Fleischner Society 2017; Radiology 2017; 284:228-243. 5. Hepatic steatosis. 6. Colonic diverticulosis without evidence of diverticulitis. 7. Mild prostatomegaly. Electronically Signed   By: Lovena Le M.D.   On: 05/25/2020 00:45    Assessment: Left ureteral calculus UTI  Procedure(s): CYSTOSCOPY WITH STENT PLACEMENT retrograde pylegram, 1 Day Post-Op  doing well.  Plan: If cultures return negative, recommend discharging with 7 days of antibiotic such as Keflex or Bactrim.  We will arrange for follow-up in my office to discuss ureteroscopy.   Link Snuffer, MD Urology 05/26/2020, 5:13  PM

## 2020-05-27 LAB — BASIC METABOLIC PANEL
Anion gap: 12 (ref 5–15)
BUN: 16 mg/dL (ref 6–20)
CO2: 23 mmol/L (ref 22–32)
Calcium: 8.7 mg/dL — ABNORMAL LOW (ref 8.9–10.3)
Chloride: 105 mmol/L (ref 98–111)
Creatinine, Ser: 1.56 mg/dL — ABNORMAL HIGH (ref 0.61–1.24)
GFR calc Af Amer: 57 mL/min — ABNORMAL LOW (ref >60)
GFR calc non Af Amer: 49 mL/min — ABNORMAL LOW (ref >60)
Glucose, Bld: 125 mg/dL — ABNORMAL HIGH (ref 70–99)
Potassium: 4.2 mmol/L (ref 3.5–5.1)
Sodium: 140 mmol/L (ref 135–145)

## 2020-05-27 LAB — CD4/CD8 (T-HELPER/T-SUPPRESSOR CELL)
CD4 absolute: 1800 /uL — ABNORMAL HIGH (ref 400–1790)
CD4%: 69 % — ABNORMAL HIGH (ref 33–65)
CD8 T Cell Abs: 415 /uL (ref 190–1000)
CD8tox: 16 % (ref 12–40)
Ratio: 4.34 — ABNORMAL HIGH (ref 1.0–3.0)
Total lymphocyte count: 2596 /uL (ref 1000–4000)

## 2020-05-27 LAB — HIV-1 RNA QUANT-NO REFLEX-BLD
HIV 1 RNA Quant: <20 copies/mL
LOG10 HIV-1 RNA: UNDETERMINED log10copy/mL

## 2020-05-27 LAB — GLUCOSE, CAPILLARY
Glucose-Capillary: 134 mg/dL — ABNORMAL HIGH (ref 70–99)
Glucose-Capillary: 136 mg/dL — ABNORMAL HIGH (ref 70–99)
Glucose-Capillary: 144 mg/dL — ABNORMAL HIGH (ref 70–99)
Glucose-Capillary: 167 mg/dL — ABNORMAL HIGH (ref 70–99)

## 2020-05-27 MED ORDER — CEFUROXIME AXETIL 500 MG PO TABS
500.0000 mg | ORAL_TABLET | Freq: Two times a day (BID) | ORAL | 0 refills | Status: AC
Start: 2020-05-28 — End: 2020-06-04

## 2020-05-27 NOTE — Progress Notes (Signed)
Patient discharged home.  IVs removed - WNL.  Reviewed AVS and medications, emphasized importance of completing entire prescription of antibiotics to treat infection.  Instructed to follow up with urology.  Patient has no questions at this time, verbalizes understanding of DC instructions.  Patient assisted off unit via [place] with RN in NAD.

## 2020-05-27 NOTE — Plan of Care (Signed)
  Problem: Clinical Measurements: Goal: Will remain free from infection 05/27/2020 1239 by Mikea Quadros, Leitha Schuller, RN Outcome: Adequate for Discharge 05/27/2020 0811 by Grant Fontana, RN Outcome: Progressing 05/27/2020 0811 by Grant Fontana, RN Outcome: Progressing   Patient will continue PO antibiotic at home to finish treatment for current infection

## 2020-05-27 NOTE — Discharge Summary (Signed)
Physician Discharge Summary  Philip Caldwell PXT:[id number] DOB: 1966-10-18 DOA: 05/24/2020  PCP: [name], MD  Admit date: 05/24/2020 Discharge date: 05/27/2020  Recommendations for Outpatient Follow-up:   Left ureteral calculus with obstruction, renal colic, complicated symptomatic UTI --Status post urgent left ureteral stent placement 6/27 --Continues to improve.  Follow-up culture data.  Home on empiric Ceftin.  HIV screening lab reactive --Discussed with patient and wife (patient wanted wife present).  No risk factors identified.  Hopefully false positive. --Follow-up confirmatory study and RNA quant, send out studies available in 1 to 5 days.   Follow-up Information    Lucas Mallow, MD. Schedule an appointment as soon as possible for a visit in 3 week(s).   Specialty: Urology Contact information: [place]  [id number] [phone]                Discharge Diagnoses: Principal diagnosis is #1 1. Left ureteral calculus with obstruction, renal colic, complicated symptomatic UTI 2. Acute kidney injury superimposed on CKD stage IIIa 3. Essential hypertension 4. Diabetes mellitus type 2 not on insulin  Discharge Condition: improved Disposition: home  Diet recommendation: diabetic diet  Filed Weights   05/24/20 2032  Weight: 88.5 kg    History of present illness:  54 year old man presented to the emergency department with complaint of flank pain and fever. CT showed left ureteropelvic junction calculus with upstream hydronephrosis.  Hospital Course:  Patient was treated with empiric antibiotics, seen by urology, underwent stent placement without apparent complication.  Acute kidney injury improved as expected.  Hospitalization was uncomplicated.  Individual issues as below.  Left ureteral calculus with obstruction, renal colic, complicated symptomatic UTI --Status post urgent left ureteral stent placement 6/27 --Continues to  improve.  Follow-up culture data.  Home on empiric Ceftin.  Acute kidney injury superimposed on CKD stage IIIa --Continues to improve, close to baseline, expect spontaneous resolution.  Essential hypertension --Stable  Diabetes mellitus type 2 not on insulin --CBG  remained stable. Resume oral agents on discharge  HIV screening lab reactive --Discussed with patient and wife (patient wanted wife present).  No risk factors identified.  Hopefully false positive. --Follow-up confirmatory study and RNA quant, send out studies available in 1 to 5 days.  Significant Hospital Events    6/26 admitted for complicated UTI, left ureteral stone with obstruction  Consults:   Urology  Procedures:   6/27 left ureteral stent placement  Significant Diagnostic Tests:   CT renal stone study mild asymmetric left hydronephrosis with 6 mm calculus at UPJ, cystitis with ascending tract infection  Micro Data:   Blood culture 6/26 NGTD  Urine culture 6/26 no growth  Antimicrobials:   Ceftriaxone 6/26 > 6/29  Ceftin 6/29 >  Today's assessment: S: Feels better, no pain other than mild dysuria. O: Vitals:  Vitals:   05/26/20 2034 05/27/20 0556  BP: (!) 147/89 (!) 144/81  Pulse: 79 68  Resp: 18 18  Temp: 98 F (36.7 C) 97.9 F (36.6 C)  SpO2: 95% 98%    Constitutional:  . Appears calm and comfortable Respiratory:  . CTA bilaterally, no w/r/r.  . Respiratory effort normal.  Cardiovascular:  . RRR, no m/r/g . No LE extremity edema   Psychiatric:  . Mental status o Mood, affect appropriate  CBG stable Creatinine continues to trend down, close to baseline, 1.56 Urine culture no growth.  Blood cultures pending.   Discharge Instructions  Discharge Instructions    Diet - low sodium  heart healthy   Complete by: As directed    Diet Carb Modified   Complete by: As directed    Discharge instructions   Complete by: As directed    Call your physician or seek  immediate medical attention for fever, pain, bleeding or worsening of condition.   Increase activity slowly   Complete by: As directed      Allergies as of 05/27/2020      Reactions   Lisinopril Cough   Penicillins Rash   Has patient had a PCN reaction causing immediate rash, facial/tongue/throat swelling, SOB or lightheadedness with hypotension: Yes Has patient had a PCN reaction causing severe rash involving mucus membranes or skin necrosis: No Has patient had a PCN reaction that required hospitalization No Has patient had a PCN reaction occurring within the last 10 years: No If all of the above answers are "NO", then may proceed with Cephalosporin use.      Medication List    STOP taking these medications   ibuprofen 200 MG tablet Commonly known as: ADVIL   ketorolac 10 MG tablet Commonly known as: TORADOL     TAKE these medications   Accu-Chek Aviva Plus test strip Generic drug: glucose blood CHECK BLOOD SUGAR ONCE DAILY   Accu-Chek Softclix Lancets lancets USE TO OBTAIN A BLOOD SPECIMEN TO TEST BLOOD SUGAR UP TO 4 TIMES A DAY   acetaminophen 500 MG tablet Commonly known as: TYLENOL Take 500 mg by mouth every 6 (six) hours as needed for moderate pain.   blood glucose meter kit and supplies Kit Dispense based on patient and insurance preference. Use up to four times daily as directed. (FOR ICD-9 250.00, 250.01).   blood glucose meter kit and supplies Kit Dispense based on patient and insurance preference. Use up to test glucose daily.  (FOR ICD - 10)   cefUROXime 500 MG tablet Commonly known as: CEFTIN Take 1 tablet (500 mg total) by mouth 2 (two) times daily for 7 days. Start taking on: May 28, 2020   glipiZIDE 5 MG tablet Commonly known as: GLUCOTROL Take 5 mg by mouth 2 (two) times daily.   hydrocortisone 2.5 % ointment Apply bid to rash What changed:   how much to take  how to take this  when to take this  reasons to take this  additional  instructions   losartan 25 MG tablet Commonly known as: COZAAR Take 2 tablets po qd What changed:   how much to take  how to take this  when to take this   metFORMIN 500 MG tablet Commonly known as: GLUCOPHAGE TAKE 1 TABLET (500 MG TOTAL) BY MOUTH 2 (TWO) TIMES DAILY WITH A MEAL.      Allergies  Allergen Reactions  . Lisinopril Cough  . Penicillins Rash    Has patient had a PCN reaction causing immediate rash, facial/tongue/throat swelling, SOB or lightheadedness with hypotension: Yes Has patient had a PCN reaction causing severe rash involving mucus membranes or skin necrosis: No Has patient had a PCN reaction that required hospitalization No Has patient had a PCN reaction occurring within the last 10 years: No If all of the above answers are "NO", then may proceed with Cephalosporin use.      The results of significant diagnostics from this hospitalization (including imaging, microbiology, ancillary and laboratory) are listed below for reference.    Significant Diagnostic Studies: DG C-Arm 1-60 Min-No Report  Result Date: 05/25/2020 Fluoroscopy was utilized by the requesting physician.  No radiographic interpretation.  CT Renal Stone Study  Result Date: 05/25/2020 CLINICAL DATA:  Left flank pain and fever EXAM: CT ABDOMEN AND PELVIS WITHOUT CONTRAST TECHNIQUE: Multidetector CT imaging of the abdomen and pelvis was performed following the standard protocol without IV contrast. COMPARISON:  01/31/2020 FINDINGS: Lower chest: Bandlike areas of opacity in the lung bases compatible with subsegmental atelectasis or scarring. More dependent atelectatic changes posteriorly. Multiple small sub 5 mm nodules again seen in the lung bases. No new or concerning pulmonary nodules. Normal heart size. No pericardial effusion. Hepatobiliary: Diffuse hepatic hypoattenuation compatible with hepatic steatosis. Sparing along the gallbladder fossa. No visible focal liver lesion on this  noncontrast CT examination. No gallbladder wall thickening or biliary dilatation. No visible calcified gallstones. Pancreas: Partial fatty replacement of the pancreas without inflammation, ductal dilatation or discernible pancreatic lesion. Spleen: Normal in size without focal abnormality. Small accessory splenule anteriorly. Adrenals/Urinary Tract: No concerning adrenal lesions. Mild asymmetric left hydronephrosis with a 6 mm calculus at the left ureteropelvic junction. Mildly increased left perinephric and periureteral stranding is present as well. More mild right perinephric stranding. Stable large 13 cm fluid attenuation cyst arising from the lower pole right kidney with a smaller adjacent 3 cm cyst as well. No concerning renal lesions are visible. Urinary bladder is largely decompressed at the time of exam and therefore poorly evaluated by CT imaging. Mild perivesicular hazy stranding is present. Wall thickening could be underdistention or reflective of cystitis. Stomach/Bowel: Distal esophagus, stomach and duodenal sweep are unremarkable. No small bowel wall thickening or dilatation. No evidence of obstruction. A normal appendix is visualized. No colonic dilatation or wall thickening. Scattered colonic diverticula without focal inflammation to suggest diverticulitis. Vascular/Lymphatic: No significant vascular findings are present. No enlarged abdominal or pelvic lymph nodes. Reproductive: Mild prostatomegaly, otherwise unremarkable prostate and seminal vesicles. Other: No abdominopelvic free fluid or free gas. No bowel containing hernias. Musculoskeletal: No acute osseous abnormality or suspicious osseous lesion. Mild degenerative changes in the spine most pronounced at L5-S1. Additional degenerative changes in the hips and pelvis. IMPRESSION: 1. Mild asymmetric left hydronephrosis with a 6 mm calculus at the left ureteropelvic junction. 2. Mildly increased left perinephric and periureteral stranding is  present as well with periureteral stranding beyond the level of the obstructive calculus. In combination with some mild perivesicular hazy stranding and wall thickening versus underdistention, and in the setting of fever, could reflect a superimposed cystitis with ascending tract infection. Correlate with urinalysis. 3. Stable renal cysts. 4. Multiple stable sub 5 mm pulmonary nodules in the lung bases. Per imaging guidelines, no follow-up needed if patient is low-risk (and has no known or suspected primary neoplasm). Otherwise Non-contrast chest CT can be considered in 12 months from initial time of detection (approximately March 2022) if patient is high-risk. This recommendation follows the consensus statement: Guidelines for Management of Incidental Pulmonary Nodules Detected on CT Images: From the Fleischner Society 2017; Radiology 2017; 284:228-243. 5. Hepatic steatosis. 6. Colonic diverticulosis without evidence of diverticulitis. 7. Mild prostatomegaly. Electronically Signed   By: Lovena Le M.D.   On: 05/25/2020 00:45    Microbiology: Recent Results (from the past 240 hour(s))  Urine culture     Status: None   Collection Time: 05/24/20  9:08 PM   Specimen: Urine, Random  Result Value Ref Range Status   Specimen Description   Final    URINE, RANDOM Performed at [facility], [address] [place], [place] [address]    Special Requests   Final  Normal Performed at [facility], [address], [place], [place] [address]    Culture   Final    NO GROWTH Performed at [facility], [place] [address], [place], [place] [address]    Report Status 05/26/2020 FINAL  Final  Culture, blood (routine x 2)     Status: None (Preliminary result)   Collection Time: 05/25/20 12:14 AM   Specimen: BLOOD RIGHT HAND  Result Value Ref Range Status   Specimen Description BLOOD RIGHT HAND  Final   Special Requests BLOOD RIGHT HAND Blood Culture adequate volume  Final   Culture   Final    NO GROWTH 2  DAYS Performed at [facility], [address], [place], [place] [address]    Report Status PENDING  Incomplete  Culture, blood (routine x 2)     Status: None (Preliminary result)   Collection Time: 05/25/20 12:15 AM   Specimen: Right Antecubital; Blood  Result Value Ref Range Status   Specimen Description RIGHT ANTECUBITAL  Final   Special Requests   Final    BOTTLES DRAWN AEROBIC AND ANAEROBIC Blood Culture adequate volume   Culture   Final    NO GROWTH 2 DAYS Performed at [facility], [address] [place], [place] [address]    Report Status PENDING  Incomplete  SARS Coronavirus 2 by RT PCR (hospital order, performed in [place] hospital lab) Nasopharyngeal Nasopharyngeal Swab     Status: None   Collection Time: 05/25/20  2:00 AM   Specimen: Nasopharyngeal Swab  Result Value Ref Range Status   SARS Coronavirus 2 NEGATIVE NEGATIVE Final    Comment: (NOTE) SARS-CoV-2 target nucleic acids are NOT DETECTED.  The SARS-CoV-2 RNA is generally detectable in upper and lower respiratory specimens during the acute phase of infection. The lowest concentration of SARS-CoV-2 viral copies this assay can detect is 250 copies / mL. A negative result does not preclude SARS-CoV-2 infection and should not be used as the sole basis for treatment or other patient management decisions.  A negative result may occur with improper specimen collection / handling, submission of specimen other than nasopharyngeal swab, presence of viral mutation(s) within the areas targeted by this assay, and inadequate number of viral copies (<250 copies / mL). A negative result must be combined with clinical observations, patient history, and epidemiological information.  Fact Sheet for Patients:   StrictlyIdeas.no  Fact Sheet for Healthcare Providers: BankingDealers.co.za  This test is not yet approved or  cleared by the [place] FDA and has been authorized  for detection and/or diagnosis of SARS-CoV-2 by FDA under an Emergency Use Authorization (EUA).  This EUA will remain in effect (meaning this test can be used) for the duration of the COVID-19 declaration under Section 564(b)(1) of the Act, 21 U.S.C. section 360bbb-3(b)(1), unless the authorization is terminated or revoked sooner.  Performed at [facility], [address], [place], [place] [address]      Labs: Basic Metabolic Panel: Recent Labs  Lab 05/24/20 2121 05/25/20 0833 05/26/20 0457 05/27/20 1037  NA 133* 133* 137 140  K 4.5 4.4 4.4 4.2  CL 99 104 108 105  CO2 _0 GLUCOSE 225* 181* 142* 125*  BUN _1 CREATININE 2.43* 2.25* 1.75* 1.56*  CALCIUM 9.2 8.1* 8.1* 8.7*    Recent Labs  Lab 05/24/20 2121  LIPASE 42   CBC: Recent Labs  Lab 05/24/20 2121 05/25/20 0833 05/26/20 0457  WBC 14.4* 11.6* 9.3  HGB 14.3 12.2* 12.7*  HCT 40.2 33.9* 35.2*  MCV 82.0 80.7 81.5  PLT 331 272 299    CBG: Recent Labs  Lab 05/26/20 2031 05/26/20 2356 05/27/20 0408 05/27/20 0720 05/27/20 1113  GLUCAP 146* 144* 134* 136* 167*    Principal Problem:   Obstruction of left ureteropelvic junction (UPJ) Active Problems:   Essential hypertension   AKI (acute kidney injury) ([place])   Type 2 diabetes mellitus without complication, without long-term current use of insulin (H[facility])   Pyohydronephrosis   Complicated UTI (urinary tract infection)   Time coordinating discharge: 25 minutes  Signed:  Murray Hodgkins, MD  [facility]  05/27/2020, 11:44 AM

## 2020-05-27 NOTE — Discharge Instructions (Signed)

## 2020-05-28 LAB — HIV-1/2 AB - DIFFERENTIATION
HIV 1 Ab: NEGATIVE
HIV 2 Ab: NEGATIVE
Note: NEGATIVE

## 2020-05-28 LAB — RNA QUALITATIVE: HIV 1 RNA Qualitative: 1

## 2020-05-30 LAB — CULTURE, BLOOD (ROUTINE X 2)
Culture: NO GROWTH
Culture: NO GROWTH
Special Requests: ADEQUATE
Special Requests: ADEQUATE

## 2020-06-11 ENCOUNTER — Encounter: Payer: Self-pay | Admitting: Orthopedic Surgery

## 2020-06-11 ENCOUNTER — Ambulatory Visit: Payer: 59

## 2020-06-11 ENCOUNTER — Ambulatory Visit (INDEPENDENT_AMBULATORY_CARE_PROVIDER_SITE_OTHER): Payer: 59 | Admitting: Orthopedic Surgery

## 2020-06-11 ENCOUNTER — Other Ambulatory Visit: Payer: Self-pay

## 2020-06-11 VITALS — BP 122/92 | HR 99 | Ht 69.0 in | Wt 198.5 lb

## 2020-06-11 DIAGNOSIS — M25512 Pain in left shoulder: Secondary | ICD-10-CM

## 2020-06-11 DIAGNOSIS — G8929 Other chronic pain: Secondary | ICD-10-CM | POA: Diagnosis not present

## 2020-06-11 MED ORDER — MELOXICAM 7.5 MG PO TABS: 7.5000 mg | ORAL_TABLET | Freq: Two times a day (BID) | ORAL | 0 refills | Status: DC

## 2020-06-11 NOTE — Patient Instructions (Signed)
Schedule MRI left shoulder open unit   Start Meloxicam twice a day

## 2020-06-11 NOTE — Progress Notes (Signed)
NEW PROBLEM//OFFICE VISIT  Chief Complaint  Patient presents with  . Shoulder Pain    L/ hurting off and on for about a year    54 year old male presents for evaluation of left shoulder pain x1 year  Patient had a history of cervical spine degenerative disc disease treated in [place] with what sounds like an injection but could not tolerate further MRIs due to severe claustrophobia  On this occasion presents with a 1 year history of pain not so much in the neck but in the left shoulder associated with decreased internal rotation trouble getting dressed pain when he elevates the arm above his head or 90 degrees although he denies any trauma   Review of Systems  Constitutional: Negative for fever and weight loss.  Neurological: Positive for weakness. Negative for tingling and sensory change.     Past Medical History:  Diagnosis Date  . A-fib ([place])   . Arm pain, central    with intermittent numbness  . Diabetes mellitus without complication ([place])   . Hypertension   . Neck pain   . Renal disorder   . Tendonitis     Past Surgical History:  Procedure Laterality Date  . COLONOSCOPY N/A 10/08/2015   Procedure: COLONOSCOPY;  Surgeon: Rogene Houston, MD;  Location: [facility];  Service: Endoscopy;  Laterality: N/A;  1030  . CYSTOSCOPY WITH STENT PLACEMENT Left 05/25/2020   Procedure: CYSTOSCOPY WITH STENT PLACEMENT retrograde pylegram;  Surgeon: Lucas Mallow, MD;  Location: [facility] ORS;  Service: Urology;  Laterality: Left;  . NERVE SURGERY      Family History  Problem Relation Age of Onset  . Dementia Mother   . Cancer Father   . Heart attack Brother    Social History   Tobacco Use  . Smoking status: Never Smoker  . Smokeless tobacco: Never Used  Vaping Use  . Vaping Use: Never used  Substance Use Topics  . Alcohol use: No  . Drug use: No    Allergies  Allergen Reactions  . Lisinopril Cough  . Penicillins Rash    Has patient had a PCN reaction causing  immediate rash, facial/tongue/throat swelling, SOB or lightheadedness with hypotension: Yes Has patient had a PCN reaction causing severe rash involving mucus membranes or skin necrosis: No Has patient had a PCN reaction that required hospitalization No Has patient had a PCN reaction occurring within the last 10 years: No If all of the above answers are "NO", then may proceed with Cephalosporin use.      Current Meds  Medication Sig  . ACCU-CHEK AVIVA PLUS test strip CHECK BLOOD SUGAR ONCE DAILY  . Accu-Chek Softclix Lancets lancets USE TO OBTAIN A BLOOD SPECIMEN TO TEST BLOOD SUGAR UP TO 4 TIMES A DAY  . acetaminophen (TYLENOL) 500 MG tablet Take 500 mg by mouth every 6 (six) hours as needed for moderate pain.  . blood glucose meter kit and supplies KIT Dispense based on patient and insurance preference. Use up to four times daily as directed. (FOR ICD-9 250.00, 250.01).  . blood glucose meter kit and supplies KIT Dispense based on patient and insurance preference. Use up to test glucose daily.  (FOR ICD - 10)  . glipiZIDE (GLUCOTROL) 5 MG tablet Take 5 mg by mouth 2 (two) times daily.  . hydrocortisone 2.5 % ointment Apply bid to rash (Patient taking differently: Apply 1 application topically 2 (two) times daily as needed (rash). )  . losartan (COZAAR) 25 MG tablet Take  2 tablets po qd (Patient taking differently: Take 50 mg by mouth daily. Take 2 tablets po qd)  . metFORMIN (GLUCOPHAGE) 500 MG tablet TAKE 1 TABLET (500 MG TOTAL) BY MOUTH 2 (TWO) TIMES DAILY WITH A MEAL.    BP (!) 122/92   Pulse 99   Ht 5' 9"  (1.753 m)   Wt 198 lb 8 oz (90 kg)   BMI 29.31 kg/m   Physical Exam Constitutional:      General: He is not in acute distress.    Appearance: He is well-developed.  Cardiovascular:     Comments: No peripheral edema Skin:    General: Skin is warm and dry.  Neurological:     Mental Status: He is alert and oriented to person, place, and time.     Sensory: No sensory  deficit.     Coordination: Coordination normal.     Gait: Gait normal.     Deep Tendon Reflexes: Reflexes are normal and symmetric.     Right Shoulder Exam  Right shoulder exam is normal.  Range of Motion  The patient has normal right shoulder ROM.  Muscle Strength  The patient has normal right shoulder strength.  Other  Scars: absent Sensation: normal Pulse: present   Left Shoulder Exam   Tenderness  Left shoulder tenderness location: Tenderness around the rotator cuff.  Range of Motion  Active abduction: abnormal  Passive abduction: abnormal  Extension: normal  External rotation: normal  Forward flexion: abnormal  Internal rotation 0 degrees: abnormal  Internal rotation 90 degrees: abnormal   Muscle Strength  Abduction: 5/5  Internal rotation: 5/5  External rotation: 5/5  Supraspinatus: 5/5  Subscapularis: 5/5  Biceps: 5/5   Tests  Apprehension: negative Hawkins test: positive Cross arm: negative Impingement: positive Drop arm: negative Sulcus: absent  Other  Erythema: absent Scars: absent Sensation: normal Pulse: present         MEDICAL DECISION MAKING  A.  Encounter Diagnosis  Name Primary?  . Chronic left shoulder pain Yes    B. DATA ANALYSED:    IMAGING: Independent interpretation of images: our xrays show a normal shoulder ? Os acromiale   Orders: MRI  Outside records reviewed: PMD   C. MANAGEMENT   Mri open unit left shoulder  Meds ordered this encounter  Medications  . meloxicam (MOBIC) 7.5 MG tablet    Sig: Take 1 tablet (7.5 mg total) by mouth 2 (two) times daily.    Dispense:  60 tablet    Refill:  0      Arther Abbott, MD  06/11/2020 10:50 AM

## 2020-07-05 ENCOUNTER — Other Ambulatory Visit: Payer: Self-pay

## 2020-07-05 ENCOUNTER — Ambulatory Visit
Admission: RE | Admit: 2020-07-05 | Discharge: 2020-07-05 | Disposition: A | Discharge status: Home / Self Care | Payer: 59 | Source: Ambulatory Visit | Attending: Orthopedic Surgery | Admitting: Orthopedic Surgery

## 2020-07-05 DIAGNOSIS — M25512 Pain in left shoulder: Secondary | ICD-10-CM

## 2020-07-07 ENCOUNTER — Telehealth: Payer: Self-pay

## 2020-07-07 NOTE — Telephone Encounter (Signed)
We can RS to open MRI

## 2020-07-07 NOTE — Telephone Encounter (Signed)
I have asked Philip Caldwell to look into this.

## 2020-07-07 NOTE — Telephone Encounter (Signed)
Patient called stating he went for his MRI @ [place] and could not get the MRI done. Stated he was put in a regular MRI unit, not a open one and he tried several times to get it done. He is asking if a CT Scan would be better.   He is asking for a return call about this [phone]. He is scheduled to come in tomorrow afternoon, he did not want appointment taken off schedule yet until he hears back from Dr. Aline Brochure.

## 2020-07-08 ENCOUNTER — Ambulatory Visit: Payer: 59 | Admitting: Orthopedic Surgery

## 2020-07-14 ENCOUNTER — Other Ambulatory Visit: Payer: Self-pay | Admitting: Orthopedic Surgery

## 2020-07-14 MED ORDER — DIAZEPAM 5 MG PO TABS
5.0000 mg | ORAL_TABLET | Freq: Once | ORAL | 0 refills | Status: DC | PRN
Start: 2020-07-14 — End: 2020-07-21

## 2020-07-14 NOTE — Telephone Encounter (Signed)
Philip Caldwell called and said he is going for MRI of his shoulder and feels that he will need something to keep him calm while this is being done.  He states he understands that the unit is open but he is very claus trophic.  He states he uses [facility].  He did ask about whether he could get a CT of the shoulder rather than an MRI.  I told him that I would ask that question for him

## 2020-07-14 NOTE — Telephone Encounter (Signed)
To Dr Aline Brochure for valium Rx CT will not show what we are looking for

## 2020-07-21 ENCOUNTER — Other Ambulatory Visit: Payer: Self-pay

## 2020-07-21 ENCOUNTER — Ambulatory Visit (INDEPENDENT_AMBULATORY_CARE_PROVIDER_SITE_OTHER): Payer: 59 | Admitting: Orthopedic Surgery

## 2020-07-21 ENCOUNTER — Encounter: Payer: Self-pay | Admitting: Orthopedic Surgery

## 2020-07-21 VITALS — BP 142/87 | HR 109 | Ht 69.0 in | Wt 198.0 lb

## 2020-07-21 DIAGNOSIS — M25512 Pain in left shoulder: Secondary | ICD-10-CM | POA: Diagnosis not present

## 2020-07-21 DIAGNOSIS — G8929 Other chronic pain: Secondary | ICD-10-CM

## 2020-07-21 MED ORDER — MELOXICAM 7.5 MG PO TABS: 7.5000 mg | ORAL_TABLET | Freq: Every day | ORAL | 5 refills | Status: DC

## 2020-07-21 NOTE — Progress Notes (Signed)
Chief Complaint  Patient presents with  . Shoulder Pain    left    MRI follow-up  I reviewed the MRI patient's pain is still around the shoulder joint  His MRI and you can see the report as well but my independent interpretation is that the MRI shows no tear there is some arthritis around the [facility] joint but is nontender there and repeat examination affirms that he does have some pain with forward elevation does not have any evidence of adhesive capsulitis  Inject left shoulder  Encounter Diagnosis  Name Primary?  . Chronic left shoulder pain Yes    Procedure note the subacromial injection shoulder left   Verbal consent was obtained to inject the  Left   Shoulder  Timeout was completed to confirm the injection site is a subacromial space of the  left  shoulder  Medication used Depo-Medrol 40 mg and lidocaine 1% 3 cc  Anesthesia was provided by ethyl chloride  The injection was performed in the left  posterior subacromial space. After pinning the skin with alcohol and anesthetized the skin with ethyl chloride the subacromial space was injected using a 20-gauge needle. There were no complications  Sterile dressing was applied.

## 2020-09-01 ENCOUNTER — Ambulatory Visit (INDEPENDENT_AMBULATORY_CARE_PROVIDER_SITE_OTHER): Payer: 59 | Admitting: Orthopedic Surgery

## 2020-09-01 ENCOUNTER — Other Ambulatory Visit: Payer: Self-pay

## 2020-09-01 ENCOUNTER — Encounter: Payer: Self-pay | Admitting: Orthopedic Surgery

## 2020-09-01 VITALS — Ht 69.0 in | Wt 198.0 lb

## 2020-09-01 DIAGNOSIS — G8929 Other chronic pain: Secondary | ICD-10-CM | POA: Diagnosis not present

## 2020-09-01 DIAGNOSIS — M25512 Pain in left shoulder: Secondary | ICD-10-CM | POA: Diagnosis not present

## 2020-09-01 DIAGNOSIS — M7502 Adhesive capsulitis of left shoulder: Secondary | ICD-10-CM

## 2020-09-01 NOTE — Progress Notes (Signed)
This is a follow-up visit for Philip Caldwell he had adhesive capsulitis normal MRI except for some asymptomatic AC joint arthritis is doing better after injection he still has some tightness in internal rotation  Exam shows internal rotation left to right is about 12 levels different  His external rotation is equal his flexion is good his strength is normal  Recommend continue his meloxicam and do his stretching exercises to stretch out the posterior capsule and follow-up with [place] as needed  Encounter Diagnoses  Name Primary?  . Chronic left shoulder pain Yes  . Adhesive capsulitis of left shoulder

## 2020-09-17 ENCOUNTER — Telehealth: Payer: Self-pay | Admitting: Family Medicine

## 2020-09-17 DIAGNOSIS — I10 Essential (primary) hypertension: Secondary | ICD-10-CM

## 2020-09-17 DIAGNOSIS — E119 Type 2 diabetes mellitus without complications: Secondary | ICD-10-CM

## 2020-09-17 MED ORDER — GLIPIZIDE 5 MG PO TABS: 5.0000 mg | ORAL_TABLET | Freq: Two times a day (BID) | ORAL | 0 refills | Status: DC

## 2020-09-17 NOTE — Telephone Encounter (Signed)
Pt needs labs revheck.  Pls give 30 day supply, no refiils and needs cmp and appt.   Dr. Lovena Le

## 2020-09-17 NOTE — Telephone Encounter (Signed)
Lab orders placed and mailed to patient. Refill sent to pharmacy

## 2020-09-17 NOTE — Telephone Encounter (Signed)
[facility] Gl[facility]ide 5 mg tablet. Take one tablet BID. Pt last seen 04/09/20. Please advise. Thank you

## 2020-10-30 ENCOUNTER — Telehealth: Payer: Self-pay | Admitting: Family Medicine

## 2020-10-30 NOTE — Telephone Encounter (Signed)
Sent pt a mychart message. 

## 2020-11-03 NOTE — Telephone Encounter (Signed)
Called pt and he states he received the my chart message and he stopped taking meloxicam and he does not have any questions

## 2020-11-17 ENCOUNTER — Encounter: Payer: Self-pay | Admitting: Family Medicine

## 2020-11-17 ENCOUNTER — Telehealth: Payer: Self-pay | Admitting: Family Medicine

## 2020-11-17 ENCOUNTER — Ambulatory Visit (INDEPENDENT_AMBULATORY_CARE_PROVIDER_SITE_OTHER): Payer: 59 | Admitting: Family Medicine

## 2020-11-17 ENCOUNTER — Other Ambulatory Visit: Payer: Self-pay

## 2020-11-17 VITALS — BP 112/74 | HR 97 | Temp 97.8°F | Ht 69.0 in | Wt 194.8 lb

## 2020-11-17 DIAGNOSIS — E119 Type 2 diabetes mellitus without complications: Secondary | ICD-10-CM

## 2020-11-17 LAB — POCT GLYCOSYLATED HEMOGLOBIN (HGB A1C): Hemoglobin A1C: 11.7 % — AB (ref 4.0–5.6)

## 2020-11-17 MED ORDER — LANTUS SOLOSTAR 100 UNIT/ML ~~LOC~~ SOPN
PEN_INJECTOR | SUBCUTANEOUS | 2 refills | Status: DC
Start: 2020-11-17 — End: 2020-11-18

## 2020-11-17 MED ORDER — PEN NEEDLES 31G X 6 MM MISC: 1 refills | Status: DC

## 2020-11-17 MED ORDER — GLIPIZIDE 5 MG PO TABS: 5.0000 mg | ORAL_TABLET | Freq: Two times a day (BID) | ORAL | 2 refills | Status: DC

## 2020-11-17 NOTE — Progress Notes (Signed)
Patient ID: Philip Caldwell, male    DOB: [birth year], 54 y.o.   MRN: [id number]   Chief Complaint  Patient presents with  . Diabetes   Subjective:    HPI  CC-Diabetes check up.   Pt stating his sugar has been in high 200's and low 300's. Pt increased his metformin from one bid to one tid and also taking glipizide bid. Needs refill on lancets.   6-8 wks has noticed his BG have been elevated.  Metformin 3x per day with each meal.  Taking glipizide bid.  Noticing inc urination last few wks. No inc in thirst.  Few yrs ago in hospital and was on insulin, but after dc was put on pills.   When taking metformin 2 xper day had low numbers. Felt worse.  Was seeing 80-90s when taking 2 at at time.  Seeing eye doctor tomorrow. Intermittent pain in feet, not consistent.  Need refill lancets. Given in phone note.  Medical History Philip Caldwell has a past medical history of A-fib ([place]), Arm pain, central, Diabetes mellitus without complication ([place]), Hypertension, Neck pain, Renal disorder, and Tendonitis.   Outpatient Encounter Medications as of 11/17/2020  Medication Sig  . ACCU-CHEK AVIVA PLUS test strip CHECK BLOOD SUGAR ONCE DAILY  . blood glucose meter kit and supplies KIT Dispense based on patient and insurance preference. Use up to four times daily as directed. (FOR ICD-9 250.00, 250.01).  . blood glucose meter kit and supplies KIT Dispense based on patient and insurance preference. Use up to test glucose daily.  (FOR ICD - 10)  . hydrocortisone 2.5 % ointment Apply bid to rash (Patient taking differently: Apply 1 application topically 2 (two) times daily as needed (rash).)  . losartan (COZAAR) 25 MG tablet Take 2 tablets po qd (Patient taking differently: Take 50 mg by mouth daily. Take 2 tablets po qd)  . metFORMIN (GLUCOPHAGE) 500 MG tablet TAKE 1 TABLET (500 MG TOTAL) BY MOUTH 2 (TWO) TIMES DAILY WITH A MEAL.  . [DISCONTINUED] Accu-Chek Softclix Lancets lancets USE TO OBTAIN A BLOOD  SPECIMEN TO TEST BLOOD SUGAR UP TO 4 TIMES A DAY  . [DISCONTINUED] glipiZIDE (GLUCOTROL) 5 MG tablet Take 1 tablet (5 mg total) by mouth 2 (two) times daily.  Marland Kitchen acetaminophen (TYLENOL) 500 MG tablet Take 500 mg by mouth every 6 (six) hours as needed for moderate pain.  Marland Kitchen glipiZIDE (GLUCOTROL) 5 MG tablet Take 1 tablet (5 mg total) by mouth 2 (two) times daily.  . Insulin Pen Needle (PEN NEEDLES) 31G X 6 MM MISC Use as directed with solostar pen.  . [DISCONTINUED] insulin glargine (LANTUS SOLOSTAR) 100 UNIT/ML Solostar Pen Take 10 units sq qhs and in 1 week increase to 20 units qhs.  . [DISCONTINUED] meloxicam (MOBIC) 7.5 MG tablet Take 1 tablet (7.5 mg total) by mouth daily.   No facility-administered encounter medications on file as of 11/17/2020.     Review of Systems  Constitutional: Negative for chills and fever.  HENT: Negative for congestion, rhinorrhea and sore throat.   Respiratory: Negative for cough, shortness of breath and wheezing.   Cardiovascular: Negative for chest pain and leg swelling.  Gastrointestinal: Negative for abdominal pain, diarrhea, nausea and vomiting.  Endocrine: Positive for polyuria. Negative for polydipsia.  Genitourinary: Negative for dysuria and frequency.  Skin: Negative for rash.  Neurological: Negative for dizziness, weakness and headaches.     Vitals BP 112/74   Pulse 97   Temp 97.8 F (36.6 C)  Ht 5' 9"  (1.753 m)   Wt 194 lb 12.8 oz (88.4 kg)   SpO2 96%   BMI 28.77 kg/m   Objective:   Physical Exam Vitals reviewed.  Constitutional:      Appearance: Normal appearance.  HENT:     Head: Normocephalic.     Nose: Nose normal. No congestion.     Mouth/Throat:     Mouth: Mucous membranes are moist.     Pharynx: No oropharyngeal exudate.  Eyes:     Extraocular Movements: Extraocular movements intact.     Conjunctiva/sclera: Conjunctivae normal.     Pupils: Pupils are equal, round, and reactive to light.  Cardiovascular:     Rate and  Rhythm: Normal rate and regular rhythm.     Pulses: Normal pulses.     Heart sounds: Normal heart sounds. No murmur heard.   Pulmonary:     Effort: Pulmonary effort is normal.     Breath sounds: Normal breath sounds. No wheezing, rhonchi or rales.  Musculoskeletal:        General: Normal range of motion.     Right lower leg: No edema.     Left lower leg: No edema.  Skin:    General: Skin is warm and dry.     Findings: No rash.  Neurological:     General: No focal deficit present.     Mental Status: He is alert and oriented to person, place, and time.  Psychiatric:        Mood and Affect: Mood normal.        Behavior: Behavior normal.      Assessment and Plan   1. Diabetes mellitus without complication (HCC) - POCT glycosylated hemoglobin (Hb A1C) - Insulin Pen Needle (PEN NEEDLES) 31G X 6 MM MISC; Use as directed with solostar pen.  Dispense: 50 each; Refill: 1 - CBC - CMP14+EGFR - Hemoglobin A1c - Lipid panel - Microalbumin, urine - glipiZIDE (GLUCOTROL) 5 MG tablet; Take 1 tablet (5 mg total) by mouth 2 (two) times daily.  Dispense: 60 tablet; Refill: 2   Pt seems non-compliant on his diabetic medications.  Pt not taking them as prescribed.  And even with him going back to taking metformin 3x per day and glipizide bid. It's not enough to help his elevated bg.  A1c- elevated at 12.2, with glucose of 352 after fasting. Pt to dec carbs in diet and increase in exercising. Pt to start taking lantus.  At time of note pt had not had labs yet.   Addendum- on 11/28/20- Elevated Cr at 1.52.   hld- Cholesterol elevated and elevated TG at 446.  Pt to watch cholesterol and dec cholesterol in diet, and increase in exercising.  Work on [employer] in diet and increase in exercise will help TGs.  F/u 2-3 wks For recheck diabetes.

## 2020-11-17 NOTE — Telephone Encounter (Signed)
Pharmacy requesting refill on Accu Check SoftClix Lancets. Use to test blood sugar up to 4 times a day. Please advise. Thank you.

## 2020-11-17 NOTE — Telephone Encounter (Signed)
Yes, pls send thx.  Dr. Darene Lamer

## 2020-11-17 NOTE — Patient Instructions (Signed)

## 2020-11-18 ENCOUNTER — Telehealth: Payer: Self-pay

## 2020-11-18 MED ORDER — INSULIN DETEMIR 100 UNIT/ML ~~LOC~~ SOLN: SUBCUTANEOUS | 3 refills | Status: DC

## 2020-11-18 MED ORDER — ACCU-CHEK SOFTCLIX LANCETS MISC
4 refills | Status: DC
Start: 2020-11-18 — End: 2020-11-18

## 2020-11-18 MED ORDER — ACCU-CHEK SOFTCLIX LANCETS MISC: 4 refills | Status: DC

## 2020-11-18 MED ORDER — "INSULIN SYRINGE 31G X 5/16"" 0.5 ML MISC": 3 refills | Status: DC

## 2020-11-18 NOTE — Telephone Encounter (Signed)
Refills sent to pharmacy. 

## 2020-11-18 NOTE — Telephone Encounter (Signed)
Yes, I sent a message to him and sent in levimir. D.r Nadalie Laughner

## 2020-11-18 NOTE — Addendum Note (Signed)
Addended by: Vicente Males on: 11/18/2020 09:01 AM   Modules accepted: Orders

## 2020-11-18 NOTE — Telephone Encounter (Signed)
Philip Caldwell come by the office this morning insurance will not cover the insuline that Dr Lovena Le prescribed. He brought the name of two that his insurance will cover Basaglar, Levemir or Tresiba Flex Touch can his med be switch to this.   Please call him at [phone]

## 2020-11-19 LAB — CBC
Hematocrit: 46.1 % (ref 37.5–51.0)
Hemoglobin: 16.2 g/dL (ref 13.0–17.7)
MCH: 29.2 pg (ref 26.6–33.0)
MCHC: 35.1 g/dL (ref 31.5–35.7)
MCV: 83 fL (ref 79–97)
Platelets: 372 10*3/uL (ref 150–450)
RBC: 5.55 x10E6/uL (ref 4.14–5.80)
RDW: 14.3 % (ref 11.6–15.4)
WBC: 10.8 10*3/uL (ref 3.4–10.8)

## 2020-11-19 LAB — CMP14+EGFR
ALT: 43 IU/L (ref 0–44)
AST: 26 IU/L (ref 0–40)
Albumin/Globulin Ratio: 1.7 (ref 1.2–2.2)
Albumin: 4.5 g/dL (ref 3.8–4.9)
Alkaline Phosphatase: 113 IU/L (ref 44–121)
BUN/Creatinine Ratio: 10 (ref 9–20)
BUN: 16 mg/dL (ref 6–24)
Bilirubin Total: 0.3 mg/dL (ref 0.0–1.2)
CO2: 24 mmol/L (ref 20–29)
Calcium: 9.3 mg/dL (ref 8.7–10.2)
Chloride: 95 mmol/L — ABNORMAL LOW (ref 96–106)
Creatinine, Ser: 1.54 mg/dL — ABNORMAL HIGH (ref 0.76–1.27)
GFR calc Af Amer: 57 mL/min/{1.73_m2} — ABNORMAL LOW (ref >59)
GFR calc non Af Amer: 50 mL/min/{1.73_m2} — ABNORMAL LOW (ref >59)
Globulin, Total: 2.7 g/dL (ref 1.5–4.5)
Glucose: 352 mg/dL — ABNORMAL HIGH (ref 65–99)
Potassium: 5 mmol/L (ref 3.5–5.2)
Sodium: 135 mmol/L (ref 134–144)
Total Protein: 7.2 g/dL (ref 6.0–8.5)

## 2020-11-19 LAB — LIPID PANEL
Chol/HDL Ratio: 5.9 ratio — ABNORMAL HIGH (ref 0.0–5.0)
Cholesterol, Total: 237 mg/dL — ABNORMAL HIGH (ref 100–199)
HDL: 40 mg/dL (ref >39)
LDL Chol Calc (NIH): 119 mg/dL — ABNORMAL HIGH (ref 0–99)
Triglycerides: 446 mg/dL — ABNORMAL HIGH (ref 0–149)
VLDL Cholesterol Cal: 78 mg/dL — ABNORMAL HIGH (ref 5–40)

## 2020-11-19 LAB — MICROALBUMIN, URINE: Microalbumin, Urine: 49.5 ug/mL

## 2020-11-19 LAB — HEMOGLOBIN A1C
Est. average glucose Bld gHb Est-mCnc: 303 mg/dL
Hgb A1c MFr Bld: 12.2 % — ABNORMAL HIGH (ref 4.8–5.6)

## 2020-11-28 MED ORDER — LOSARTAN POTASSIUM 25 MG PO TABS: ORAL_TABLET | ORAL | 1 refills | Status: DC

## 2020-12-09 ENCOUNTER — Telehealth: Payer: Self-pay | Admitting: Family Medicine

## 2020-12-09 ENCOUNTER — Telehealth (INDEPENDENT_AMBULATORY_CARE_PROVIDER_SITE_OTHER): Payer: 59 | Admitting: Family Medicine

## 2020-12-09 ENCOUNTER — Other Ambulatory Visit: Payer: Self-pay

## 2020-12-09 DIAGNOSIS — I10 Essential (primary) hypertension: Secondary | ICD-10-CM | POA: Diagnosis not present

## 2020-12-09 DIAGNOSIS — E1165 Type 2 diabetes mellitus with hyperglycemia: Secondary | ICD-10-CM | POA: Diagnosis not present

## 2020-12-09 MED ORDER — INSULIN DETEMIR 100 UNIT/ML ~~LOC~~ SOLN
SUBCUTANEOUS | 3 refills | Status: DC
Start: 2020-12-09 — End: 2021-04-01

## 2020-12-09 NOTE — Telephone Encounter (Signed)
Mr. Philip Caldwell, Philip Caldwell are scheduled for a virtual visit with your provider today.    Just as we do with appointments in the office, we must obtain your consent to participate.  Your consent will be active for this visit and any virtual visit you may have with one of our providers in the next 365 days.    If you have a MyChart account, I can also send a copy of this consent to you electronically.  All virtual visits are billed to your insurance company just like a traditional visit in the office.  As this is a virtual visit, video technology does not allow for your provider to perform a traditional examination.  This may limit your provider's ability to fully assess your condition.  If your provider identifies any concerns that need to be evaluated in person or the need to arrange testing such as labs, EKG, etc, we will make arrangements to do so.    Although advances in technology are sophisticated, we cannot ensure that it will always work on either your end or our end.  If the connection with a video visit is poor, we may have to switch to a telephone visit.  With either a video or telephone visit, we are not always able to ensure that we have a secure connection.   I need to obtain your verbal consent now.   Are you willing to proceed with your visit today?   [name] has provided verbal consent on 12/09/2020 for a virtual visit (video or telephone).   Vicente Males, LPN 0/[id number]/[id number]  2:45 AM

## 2020-12-09 NOTE — Progress Notes (Signed)
Patient ID: Philip Caldwell, male    DOB: 05-12-1966, 55 y.o.   MRN: [id number]  Virtual Visit via phone Note  I connected with Philip Caldwell on 12/09/20 at 10:40 AM EST by a video enabled telemedicine application and verified that I am speaking with the correct person using two identifiers.  Location: Patient: home Provider: office   I discussed the limitations of evaluation and management by telemedicine and the availability of in person appointments. The patient expressed understanding and agreed to proceed.   Chief Complaint  Patient presents with  . Diabetes   Subjective:    HPI  Cc- f/u dm Failed video, and turned into phone visit. Pt here for follow up on DM. Pt states that his numbers are looking better. Checking sugars BID. Does not see a foot doctor but does see eye doctor. Pt states BP is great. Pt is using Levemir 20 units qhs.   Going better with lower BG in am since last visit.  Taking 20 units levemir at night. Pt not having any concerns with taking insulin. Seeing 140s in AM blood glucose.   This am had a lower 106 this am. Has had decrease in thirst and urination Taking metformin and glipizide  Foot concerns- none.  Checking bp at home.  BP 127/83. Taking 58m losartan 2 tab daily. No chest pain, sob, headache, le edema.  Medical History Philip Caldwell a past medical history of A-fib ([place], Arm pain, central, Diabetes mellitus without complication ([place], Hypertension, Neck pain, Renal disorder, and Tendonitis.   Outpatient Encounter Medications as of 12/09/2020  Medication Sig  . ACCU-CHEK AVIVA PLUS test strip CHECK BLOOD SUGAR ONCE DAILY  . Accu-Chek Softclix Lancets lancets Check glucose 3x per day.  . blood glucose meter kit and supplies KIT Dispense based on patient and insurance preference. Use up to four times daily as directed. (FOR ICD-9 250.00, 250.01).  . blood glucose meter kit and supplies KIT Dispense based on patient and insurance  preference. Use up to test glucose daily.  (FOR ICD - 10)  . glipiZIDE (GLUCOTROL) 5 MG tablet Take 1 tablet (5 mg total) by mouth 2 (two) times daily.  . hydrocortisone 2.5 % ointment Apply bid to rash (Patient taking differently: Apply 1 application topically 2 (two) times daily as needed (rash).)  . Insulin Pen Needle (PEN NEEDLES) 31G X 6 MM MISC Use as directed with solostar pen.  . Insulin Syringe-Needle U-100 (INSULIN SYRINGE .5CC/31GX5/16") 31G X 5/16" 0.5 ML MISC Use as directed qhs.  . losartan (COZAAR) 25 MG tablet Take 2 tablets po qd  . metFORMIN (GLUCOPHAGE) 500 MG tablet TAKE 1 TABLET (500 MG TOTAL) BY MOUTH 2 (TWO) TIMES DAILY WITH A MEAL.  . [DISCONTINUED] insulin detemir (LEVEMIR) 100 UNIT/ML injection Inject 10 units SQ qhs for 1 wk, then increase to 20 units qhs.  . insulin detemir (LEVEMIR) 100 UNIT/ML injection Inject 20 units SQ qhs.   No facility-administered encounter medications on file as of 12/09/2020.     Review of Systems  Constitutional: Negative for chills and fever.  HENT: Negative for congestion, rhinorrhea and sore throat.   Respiratory: Negative for cough, shortness of breath and wheezing.   Cardiovascular: Negative for chest pain and leg swelling.  Gastrointestinal: Negative for abdominal pain, diarrhea, nausea and vomiting.  Endocrine: Negative for polydipsia, polyphagia and polyuria.  Genitourinary: Negative for dysuria and frequency.  Skin: Negative for rash.  Neurological: Negative for dizziness, weakness and headaches.  Vitals There were no vitals taken for this visit.  Objective:   Physical Exam  No PE due to phone visit.  Assessment and Plan   1. Uncontrolled type 2 diabetes mellitus with hyperglycemia (HCC) - insulin detemir (LEVEMIR) 100 UNIT/ML injection; Inject 20 units SQ qhs.  Dispense: 20 mL; Refill: 3 - CBC - CMP14+EGFR - Hemoglobin A1c - Lipid panel  2. Essential hypertension   dm2- improving.  Cont meds.  No new  concerns.   htn- stable. cont meds.  Pt to get labs before next visit. Labs ordered.   F/u [place] prn.   Follow Up Instructions:    I discussed the assessment and treatment plan with the patient. The patient was provided an opportunity to ask questions and all were answered. The patient agreed with the plan and demonstrated an understanding of the instructions.   The patient was advised to call back or seek an in-person evaluation if the symptoms worsen or if the condition fails to improve as anticipated.  I provided 15 minutes of non-face-to-face time during this encounter.

## 2021-01-19 ENCOUNTER — Other Ambulatory Visit: Payer: Self-pay | Admitting: Family Medicine

## 2021-01-19 DIAGNOSIS — E119 Type 2 diabetes mellitus without complications: Secondary | ICD-10-CM

## 2021-01-29 ENCOUNTER — Other Ambulatory Visit: Payer: Self-pay | Admitting: Family Medicine

## 2021-03-27 ENCOUNTER — Telehealth: Payer: Self-pay

## 2021-03-27 NOTE — Telephone Encounter (Signed)
Labs were ordered on 12/09/20. Left message to return call to let patient know

## 2021-03-27 NOTE — Telephone Encounter (Signed)
Pt is having A1C visit next Wed will need blood work   Call back [phone]

## 2021-03-29 NOTE — Telephone Encounter (Signed)
Already ordered.  Dr. Lovena Le

## 2021-03-30 NOTE — Telephone Encounter (Signed)
Pt is aware. Pt came into office this morning and lab orders were given to patient

## 2021-03-31 LAB — LIPID PANEL
Chol/HDL Ratio: 4.7 ratio (ref 0.0–5.0)
Cholesterol, Total: 193 mg/dL (ref 100–199)
HDL: 41 mg/dL (ref >39)
LDL Chol Calc (NIH): 108 mg/dL — ABNORMAL HIGH (ref 0–99)
Triglycerides: 257 mg/dL — ABNORMAL HIGH (ref 0–149)
VLDL Cholesterol Cal: 44 mg/dL — ABNORMAL HIGH (ref 5–40)

## 2021-03-31 LAB — CMP14+EGFR
ALT: 58 IU/L — ABNORMAL HIGH (ref 0–44)
AST: 29 IU/L (ref 0–40)
Albumin/Globulin Ratio: 1.4 (ref 1.2–2.2)
Albumin: 4.3 g/dL (ref 3.8–4.9)
Alkaline Phosphatase: 91 IU/L (ref 44–121)
BUN/Creatinine Ratio: 8 — ABNORMAL LOW (ref 9–20)
BUN: 14 mg/dL (ref 6–24)
Bilirubin Total: 0.3 mg/dL (ref 0.0–1.2)
CO2: 21 mmol/L (ref 20–29)
Calcium: 10 mg/dL (ref 8.7–10.2)
Chloride: 102 mmol/L (ref 96–106)
Creatinine, Ser: 1.66 mg/dL — ABNORMAL HIGH (ref 0.76–1.27)
Globulin, Total: 3 g/dL (ref 1.5–4.5)
Glucose: 165 mg/dL — ABNORMAL HIGH (ref 65–99)
Potassium: 5 mmol/L (ref 3.5–5.2)
Sodium: 140 mmol/L (ref 134–144)
Total Protein: 7.3 g/dL (ref 6.0–8.5)
eGFR: 48 mL/min/{1.73_m2} — ABNORMAL LOW (ref >59)

## 2021-03-31 LAB — HEMOGLOBIN A1C
Est. average glucose Bld gHb Est-mCnc: 206 mg/dL
Hgb A1c MFr Bld: 8.8 % — ABNORMAL HIGH (ref 4.8–5.6)

## 2021-03-31 LAB — CBC
Hematocrit: 46.7 % (ref 37.5–51.0)
Hemoglobin: 15.8 g/dL (ref 13.0–17.7)
MCH: 28.3 pg (ref 26.6–33.0)
MCHC: 33.8 g/dL (ref 31.5–35.7)
MCV: 84 fL (ref 79–97)
Platelets: 352 10*3/uL (ref 150–450)
RBC: 5.58 x10E6/uL (ref 4.14–5.80)
RDW: 14.5 % (ref 11.6–15.4)
WBC: 10.2 10*3/uL (ref 3.4–10.8)

## 2021-04-01 ENCOUNTER — Encounter: Payer: Self-pay | Admitting: Family Medicine

## 2021-04-01 ENCOUNTER — Other Ambulatory Visit: Payer: Self-pay

## 2021-04-01 ENCOUNTER — Ambulatory Visit (INDEPENDENT_AMBULATORY_CARE_PROVIDER_SITE_OTHER): Payer: 59 | Admitting: Family Medicine

## 2021-04-01 VITALS — BP 136/88 | HR 109 | Temp 97.1°F | Ht 69.0 in | Wt 202.4 lb

## 2021-04-01 DIAGNOSIS — I10 Essential (primary) hypertension: Secondary | ICD-10-CM | POA: Diagnosis not present

## 2021-04-01 DIAGNOSIS — N183 Chronic kidney disease, stage 3 unspecified: Secondary | ICD-10-CM

## 2021-04-01 DIAGNOSIS — E1165 Type 2 diabetes mellitus with hyperglycemia: Secondary | ICD-10-CM

## 2021-04-01 DIAGNOSIS — E119 Type 2 diabetes mellitus without complications: Secondary | ICD-10-CM

## 2021-04-01 MED ORDER — METFORMIN HCL 500 MG PO TABS: ORAL_TABLET | ORAL | 1 refills | Status: DC

## 2021-04-01 MED ORDER — INSULIN DETEMIR 100 UNIT/ML ~~LOC~~ SOLN: SUBCUTANEOUS | 3 refills | Status: DC

## 2021-04-01 MED ORDER — ACCU-CHEK AVIVA PLUS VI STRP: ORAL_STRIP | 3 refills | Status: DC

## 2021-04-01 MED ORDER — GLIPIZIDE 5 MG PO TABS: 5.0000 mg | ORAL_TABLET | Freq: Two times a day (BID) | ORAL | 1 refills | Status: DC

## 2021-04-01 MED ORDER — LOSARTAN POTASSIUM 25 MG PO TABS: ORAL_TABLET | ORAL | 1 refills | Status: DC

## 2021-04-01 NOTE — Progress Notes (Signed)
Patient ID: Philip Caldwell, male    DOB: 1966-11-05, 55 y.o.   MRN: [id number]   Chief Complaint  Patient presents with  . Diabetes   Subjective:    HPI Pt here for follow up on DM. Pt had blood work done on Monday. No issues with DM. Seen eye doctor in February; no issues. Does not see a foot doctor; does get regular pedicures; no issues with feet. Checking sugars every morning. Taking all meds as directed. Trying to eat healthy but could do better.   Checking in am some bg- 140-150s on average. Seeing higher numbers if eating high carb at night. Not seeing 200-300 ranges.  Now a1c improved to 8.8 was at 12.2.  Low 80's didn't feel good, only happened 2x was about 2 months ago.  HTN Pt compliant with BP meds.  No SEs Denies chest pain, sob, LE swelling, or blurry vision.   Medical History Philip Caldwell has a past medical history of A-fib (Philip Caldwell), Arm pain, central, Diabetes mellitus without complication (Philip Caldwell), Hypertension, Neck pain, Renal disorder, and Tendonitis.   Outpatient Encounter Medications as of 04/01/2021  Medication Sig  . Accu-Chek Softclix Lancets lancets Check glucose 3x per day.  . blood glucose meter kit and supplies KIT Dispense based on patient and insurance preference. Use up to four times daily as directed. (FOR ICD-9 250.00, 250.01).  . blood glucose meter kit and supplies KIT Dispense based on patient and insurance preference. Use up to test glucose daily.  (FOR ICD - 10)  . hydrocortisone 2.5 % ointment Apply bid to rash (Patient taking differently: Apply 1 application topically 2 (two) times daily as needed (rash).)  . Insulin Pen Needle (PEN NEEDLES) 31G X 6 MM MISC Use as directed with solostar pen.  . Insulin Syringe-Needle U-100 (INSULIN SYRINGE .5CC/31GX5/16") 31G X 5/16" 0.5 ML MISC Use as directed qhs.  . [DISCONTINUED] ACCU-CHEK AVIVA PLUS test strip USE TO CHECK BLOOD SUGAR ONCE DAILY  . [DISCONTINUED] glipiZIDE (GLUCOTROL) 5 MG tablet TAKE 1 TABLET  BY MOUTH TWICE A DAY  . [DISCONTINUED] insulin detemir (LEVEMIR) 100 UNIT/ML injection Inject 20 units SQ qhs.  . [DISCONTINUED] losartan (COZAAR) 25 MG tablet Take 2 tablets po qd  . [DISCONTINUED] metFORMIN (GLUCOPHAGE) 500 MG tablet TAKE 1 TABLET (500 MG TOTAL) BY MOUTH 2 (TWO) TIMES DAILY WITH A MEAL.  Marland Kitchen glipiZIDE (GLUCOTROL) 5 MG tablet Take 1 tablet (5 mg total) by mouth 2 (two) times daily.  Marland Kitchen glucose blood (ACCU-CHEK AVIVA PLUS) test strip Check glucose bid.  . insulin detemir (LEVEMIR) 100 UNIT/ML injection Inject 25 units SQ qhs.  . losartan (COZAAR) 25 MG tablet Take 2 tablets po qd  . metFORMIN (GLUCOPHAGE) 500 MG tablet TAKE 1 TABLET (500 MG TOTAL) BY MOUTH 2 (TWO) TIMES DAILY WITH A MEAL.   No facility-administered encounter medications on file as of 04/01/2021.     Review of Systems  Constitutional: Negative for chills and fever.  HENT: Negative for congestion, rhinorrhea and sore throat.   Respiratory: Negative for cough, shortness of breath and wheezing.   Cardiovascular: Negative for chest pain and leg swelling.  Gastrointestinal: Negative for abdominal pain, diarrhea, nausea and vomiting.  Genitourinary: Negative for dysuria and frequency.  Skin: Negative for rash.  Neurological: Negative for dizziness, weakness and headaches.     Vitals BP 136/88   Pulse (!) 109   Temp (!) 97.1 F (36.2 C)   Ht 5' 9"  (1.753 m)   Wt 202 lb  6.4 oz (91.8 kg)   SpO2 95%   BMI 29.89 kg/m   Objective:   Physical Exam Vitals and nursing note reviewed.  Constitutional:      General: He is not in acute distress.    Appearance: Normal appearance. He is not ill-appearing.  HENT:     Head: Normocephalic.     Nose: Nose normal. No congestion.     Mouth/Throat:     Mouth: Mucous membranes are moist.     Pharynx: No oropharyngeal exudate.  Eyes:     Extraocular Movements: Extraocular movements intact.     Conjunctiva/sclera: Conjunctivae normal.     Pupils: Pupils are equal,  round, and reactive to light.  Cardiovascular:     Rate and Rhythm: Regular rhythm. Tachycardia present.     Pulses: Normal pulses.     Heart sounds: Normal heart sounds. No murmur heard.   Pulmonary:     Effort: Pulmonary effort is normal.     Breath sounds: Normal breath sounds. No wheezing, rhonchi or rales.  Musculoskeletal:        General: Normal range of motion.     Right lower leg: No edema.     Left lower leg: No edema.  Skin:    General: Skin is warm and dry.     Findings: No rash.  Neurological:     General: No focal deficit present.     Mental Status: He is alert and oriented to person, place, and time.     Cranial Nerves: No cranial nerve deficit.  Psychiatric:        Mood and Affect: Mood normal.        Behavior: Behavior normal.        Thought Content: Thought content normal.        Judgment: Judgment normal.      Assessment and Plan   1. Uncontrolled type 2 diabetes mellitus with hyperglycemia (Philip Caldwell) - insulin detemir (LEVEMIR) 100 UNIT/ML injection; Inject 25 units SQ qhs.  Dispense: 20 mL; Refill: 3 - glipiZIDE (GLUCOTROL) 5 MG tablet; Take 1 tablet (5 mg total) by mouth 2 (two) times daily.  Dispense: 180 tablet; Refill: 1  2. Stage 3 chronic kidney disease, unspecified whether stage 3a or 3b CKD (Philip Caldwell)  3. Essential hypertension    CKD- Recheck Cr on next visit. Cont to dec salt and carbs in diet and inc in exercising Cr -1.66, increased from last time at 1.54.   dm2-improving. Inc levimir to 25 units at nihgt and cont with metformin bid 530m and glipizide 551mbid with food.  Avoid taking extra glipizide and metformin, as he was.  Only take it as directed. Call if seeing numbers under 80 or over 180's. -A1c- 8.8, last time was 12.2, has improved.  Needing to cont to work toward getting a1c in the 7s.  htn- suboptimal. Cont to watch salt in diet. Cont with exercising.  Cont meds.  Return in about 4 months (around 08/02/2021) for f/u dm2, htn.

## 2021-04-10 ENCOUNTER — Encounter: Payer: Self-pay | Admitting: Emergency Medicine

## 2021-04-10 ENCOUNTER — Other Ambulatory Visit: Payer: Self-pay

## 2021-04-10 ENCOUNTER — Ambulatory Visit
Admission: EM | Admit: 2021-04-10 | Discharge: 2021-04-10 | Disposition: A | Discharge status: Home / Self Care | Payer: 59 | Attending: Emergency Medicine | Admitting: Emergency Medicine

## 2021-04-10 DIAGNOSIS — W57XXXA Bitten or stung by nonvenomous insect and other nonvenomous arthropods, initial encounter: Secondary | ICD-10-CM

## 2021-04-10 DIAGNOSIS — S50362A Insect bite (nonvenomous) of left elbow, initial encounter: Secondary | ICD-10-CM

## 2021-04-10 DIAGNOSIS — L299 Pruritus, unspecified: Secondary | ICD-10-CM | POA: Diagnosis not present

## 2021-04-10 MED ORDER — TRIAMCINOLONE ACETONIDE 0.1 % EX CREA: 1.0000 "application " | TOPICAL_CREAM | Freq: Two times a day (BID) | CUTANEOUS | 0 refills | Status: DC

## 2021-04-10 MED ORDER — MUPIROCIN 2 % EX OINT: 1.0000 "application " | TOPICAL_OINTMENT | Freq: Two times a day (BID) | CUTANEOUS | 0 refills | Status: DC

## 2021-04-10 NOTE — Discharge Instructions (Signed)
Clean with warm water and mild soap Bactroban ointment prescribed to prevent infection.  Use as directed Triamcinolone ointment prescribed for itching.   Follow up with PCP if symptoms persists Return or go to the ER if you have any new or worsening symptoms such as fever, chills, nausea, vomiting, redness, swelling, discharge, if symptoms do not improve with medications, etc..Marland Kitchen

## 2021-04-10 NOTE — ED Triage Notes (Signed)
Insect bite to LT bend of arm that happened last night.  Area is red and stings at times.

## 2021-04-10 NOTE — ED Provider Notes (Signed)
[place]   [id number] 04/10/21 Arrival Time: 1701  CC: Insect bite  SUBJECTIVE:  Philip Caldwell is a 55 y.o. male who presents with a insect bite to inside of left elbow x 1 day.  Unsure what type of insect bit.  Localizes the insect bite of LT inside of elbow.  Describes it as painful, red, and itchy.  Denies alleviating or aggravating factors.  Denies fever, chills, nausea, vomiting, discharge.  ROS: As per HPI.  All other pertinent ROS negative.     Past Medical History:  Diagnosis Date  . A-fib ([place])   . Arm pain, central    with intermittent numbness  . Diabetes mellitus without complication ([place])   . Hypertension   . Neck pain   . Renal disorder   . Tendonitis    Past Surgical History:  Procedure Laterality Date  . COLONOSCOPY N/A 10/08/2015   Procedure: COLONOSCOPY;  Surgeon: Rogene Houston, MD;  [facility];  Service: Endoscopy;  Laterality: N/A;  1030  . CYSTOSCOPY WITH STENT PLACEMENT Left 05/25/2020   Procedure: CYSTOSCOPY WITH STENT PLACEMENT retrograde pylegram;  Surgeon: Lucas Mallow, MD;  Location: [facility];  Service: Urology;  Laterality: Left;  . NERVE SURGERY     Allergies  Allergen Reactions  . Lisinopril Cough  . Penicillins Rash    Has patient had a PCN reaction causing immediate rash, facial/tongue/throat swelling, SOB or lightheadedness with hypotension: Yes Has patient had a PCN reaction causing severe rash involving mucus membranes or skin necrosis: No Has patient had a PCN reaction that required hospitalization No Has patient had a PCN reaction occurring within the last 10 years: No If all of the above answers are "NO", then may proceed with Cephalosporin use.     No current facility-administered medications on file prior to encounter.   Current Outpatient Medications on File Prior to Encounter  Medication Sig Dispense Refill  . Accu-Chek Softclix Lancets lancets Check glucose 3x per day. 200 each 4  . blood  glucose meter kit and supplies KIT Dispense based on patient and insurance preference. Use up to four times daily as directed. (FOR ICD-9 250.00, 250.01). 1 each 0  . blood glucose meter kit and supplies KIT Dispense based on patient and insurance preference. Use up to test glucose daily.  (FOR ICD - 10) 1 each 1  . glipiZIDE (GLUCOTROL) 5 MG tablet Take 1 tablet (5 mg total) by mouth 2 (two) times daily. 180 tablet 1  . glucose blood (ACCU-CHEK AVIVA PLUS) test strip Check glucose bid. 100 strip 3  . hydrocortisone 2.5 % ointment Apply bid to rash (Patient taking differently: Apply 1 application topically 2 (two) times daily as needed (rash).) 30 g 0  . insulin detemir (LEVEMIR) 100 UNIT/ML injection Inject 25 units SQ qhs. 20 mL 3  . Insulin Pen Needle (PEN NEEDLES) 31G X 6 MM MISC Use as directed with solostar pen. 50 each 1  . Insulin Syringe-Needle U-100 (INSULIN SYRINGE .5CC/31GX5/16") 31G X 5/16" 0.5 ML MISC Use as directed qhs. 100 each 3  . losartan (COZAAR) 25 MG tablet Take 2 tablets po qd 180 tablet 1  . metFORMIN (GLUCOPHAGE) 500 MG tablet TAKE 1 TABLET (500 MG TOTAL) BY MOUTH 2 (TWO) TIMES DAILY WITH A MEAL. 180 tablet 1   Social History   Socioeconomic History  . Marital status: Married    Spouse name: Not on file  . Number of children: Not on file  .  Years of education: Not on file  . Highest education level: Not on file  Occupational History  . Not on file  Tobacco Use  . Smoking status: Never Smoker  . Smokeless tobacco: Never Used  Vaping Use  . Vaping Use: Never used  Substance and Sexual Activity  . Alcohol use: No  . Drug use: No  . Sexual activity: Yes    Birth control/protection: None  Other Topics Concern  . Not on file  Social History Narrative  . Not on file   Social Determinants of Health   Financial Resource Strain: Not on file  Food Insecurity: Not on file  Transportation Needs: Not on file  Physical Activity: Not on file  Stress: Not on file   Social Connections: Not on file  Intimate Partner Violence: Not on file   Family History  Problem Relation Age of Onset  . Dementia Mother   . Cancer Father   . Heart attack Brother     OBJECTIVE: Vitals:   04/10/21 1825  BP: (!) 142/94  Pulse: 100  Resp: 18  Temp: 98.5 F (36.9 C)  TempSrc: Oral  SpO2: 92%    General appearance: alert; no distress Head: NCAT Lungs: normal respiratory effort Heart: Radial pulse 2+ Extremities: no edema Skin: warm and dry; 2 x 2 circular area of erythema to LT lateral inferior elbow, NTTP, no obvious drainage or bleeding Psychological: alert and cooperative; normal mood and affect  ASSESSMENT & PLAN:  1. Insect bite of left elbow, initial encounter   2. Itching     Meds ordered this encounter  Medications  . mupirocin ointment (BACTROBAN) 2 %    Sig: Apply 1 application topically 2 (two) times daily.    Dispense:  22 g    Refill:  0    Order Specific Question:   Supervising Provider    Answer:   Raylene Everts [[id number]]  . triamcinolone cream (KENALOG) 0.1 %    Sig: Apply 1 application topically 2 (two) times daily.    Dispense:  30 g    Refill:  0    Order Specific Question:   Supervising Provider    Answer:   Raylene Everts [[id number]]    Clean with warm water and mild soap Bactroban ointment prescribed to prevent infection.  Use as directed Triamcinolone ointment prescribed for itching.   Follow up with PCP if symptoms persists Return or go to the ER if you have any new or worsening symptoms such as fever, chills, nausea, vomiting, redness, swelling, discharge, if symptoms do not improve with medications, etc...  Reviewed expectations re: course of current medical issues. Questions answered. Outlined signs and symptoms indicating need for more acute intervention. Patient verbalized understanding. After Visit Summary given.   Lestine Box, PA-C 04/10/21 1855

## 2021-04-12 ENCOUNTER — Encounter: Payer: Self-pay | Admitting: Family Medicine

## 2021-05-05 ENCOUNTER — Telehealth: Payer: Self-pay

## 2021-05-05 NOTE — Telephone Encounter (Signed)
Pt is type diabetic and sugar numbers keep going up this morning 263 and then eat bowl plain cherrios and took medication with water and checked again and is 351 he is wanting to know if Dr Lovena Le wants him to increase insulin he is taking 25 units at night and taking  metFORMIN during the day and he said insuline was working but numbers keep going up and he can't get them to come down.   Call back [phone]

## 2021-05-05 NOTE — Telephone Encounter (Signed)
Pt contacted and verbalized understanding.  

## 2021-05-05 NOTE — Telephone Encounter (Signed)
Yes pls have the pt increase the levimir to 30 units at night starting tonight, and increase by 2 units every night till blood glucose in am is under 140.   Pls call if needing refill or having any issues with low blood glucose.   Dr. Lovena Le

## 2021-05-05 NOTE — Telephone Encounter (Signed)
Please advise. Thank you

## 2021-08-27 ENCOUNTER — Telehealth: Payer: Self-pay | Admitting: Family Medicine

## 2021-08-27 DIAGNOSIS — E1165 Type 2 diabetes mellitus with hyperglycemia: Secondary | ICD-10-CM

## 2021-08-27 NOTE — Addendum Note (Signed)
Addended by: Vicente Males on: 08/27/2021 11:43 AM   Modules accepted: Orders

## 2021-08-27 NOTE — Telephone Encounter (Signed)
Referral placed and pt is aware. 

## 2021-08-27 NOTE — Telephone Encounter (Signed)
Pt requesting referral to Endocrinology for Diabetes. Pt states he was told a referral would be placed but no referral in system at this time. Per Eilleen Kempf spoke with endo yesterday. Please advise. Thank you

## 2021-08-27 NOTE — Telephone Encounter (Signed)
Please go ahead with referral thank you 

## 2021-09-10 ENCOUNTER — Encounter: Payer: Self-pay | Admitting: "Endocrinology

## 2021-09-10 ENCOUNTER — Ambulatory Visit (INDEPENDENT_AMBULATORY_CARE_PROVIDER_SITE_OTHER): Payer: 59 | Admitting: "Endocrinology

## 2021-09-10 ENCOUNTER — Other Ambulatory Visit: Payer: Self-pay

## 2021-09-10 VITALS — BP 138/84 | HR 88 | Ht 69.0 in | Wt 200.2 lb

## 2021-09-10 DIAGNOSIS — E782 Mixed hyperlipidemia: Secondary | ICD-10-CM

## 2021-09-10 DIAGNOSIS — I10 Essential (primary) hypertension: Secondary | ICD-10-CM

## 2021-09-10 DIAGNOSIS — E669 Obesity, unspecified: Secondary | ICD-10-CM

## 2021-09-10 DIAGNOSIS — E119 Type 2 diabetes mellitus without complications: Secondary | ICD-10-CM

## 2021-09-10 DIAGNOSIS — E1165 Type 2 diabetes mellitus with hyperglycemia: Secondary | ICD-10-CM

## 2021-09-10 DIAGNOSIS — Z6829 Body mass index (BMI) 29.0-29.9, adult: Secondary | ICD-10-CM

## 2021-09-10 LAB — POCT GLYCOSYLATED HEMOGLOBIN (HGB A1C): HbA1c, POC (controlled diabetic range): 10 % — AB (ref 0.0–7.0)

## 2021-09-10 MED ORDER — INSULIN DETEMIR 100 UNIT/ML ~~LOC~~ SOLN: 20.0000 [IU] | Freq: Every day | SUBCUTANEOUS | 1 refills | Status: DC

## 2021-09-10 MED ORDER — GLIPIZIDE 5 MG PO TABS: 5.0000 mg | ORAL_TABLET | Freq: Every day | ORAL | 1 refills | Status: DC

## 2021-09-10 NOTE — Patient Instructions (Signed)

## 2021-09-10 NOTE — Progress Notes (Signed)
Endocrinology Consult Note       09/10/2021, 3:45 PM   Subjective:    Patient ID: Philip Caldwell, male    DOB: 17-May-1966.  Philip Caldwell is being seen in consultation for management of currently uncontrolled symptomatic diabetes requested by  Erven Colla, DO.   Past Medical History:  Diagnosis Date   A-fib ([facility])    Arm pain, central    with intermittent numbness   Diabetes mellitus without complication ([place])    Hypertension    Neck pain    Renal disorder    Tendonitis     Past Surgical History:  Procedure Laterality Date   COLONOSCOPY N/A 10/08/2015   Procedure: COLONOSCOPY;  Surgeon: Rogene Houston, MD;  [facility];  Service: Endoscopy;  Laterality: N/A;  [place] Left 05/25/2020   Procedure: CYSTOSCOPY WITH STENT PLACEMENT retrograde pylegram;  Surgeon: Lucas Mallow, MD;  Location: [facility];  Service: Urology;  Laterality: Left;   NERVE SURGERY      Social History   Socioeconomic History   Marital status: Married    Spouse name: Not on file   Number of children: Not on file   Years of education: Not on file   Highest education level: Not on file  Occupational History   Not on file  Tobacco Use   Smoking status: Never   Smokeless tobacco: Never  Vaping Use   Vaping Use: Never used  Substance and Sexual Activity   Alcohol use: No   Drug use: No   Sexual activity: Yes    Birth control/protection: None  Other Topics Concern   Not on file  Social History Narrative   Not on file   Social Determinants of Health   Financial Resource Strain: Not on file  Food Insecurity: Not on file  Transportation Needs: Not on file  Physical Activity: Not on file  Stress: Not on file  Social Connections: Not on file    Family History  Problem Relation Age of Onset   Thyroid disease Mother    Dementia Mother    Hypertension Father     Cancer Father    Heart attack Brother     Outpatient Encounter Medications as of 09/10/2021  Medication Sig   Ascorbic Acid (VITAMIN C PO) Take 1 tablet by mouth 3 (three) times a week.   Cholecalciferol (VITAMIN D3 PO) Take 1 tablet by mouth 3 (three) times a week.   Accu-Chek Softclix Lancets lancets Check glucose 3x per day.   blood glucose meter kit and supplies KIT Dispense based on patient and insurance preference. Use up to four times daily as directed. (FOR ICD-9 250.00, 250.01).   blood glucose meter kit and supplies KIT Dispense based on patient and insurance preference. Use up to test glucose daily.  (FOR ICD - 10)   glipiZIDE (GLUCOTROL) 5 MG tablet Take 1 tablet (5 mg total) by mouth daily with breakfast.   glucose blood (ACCU-CHEK AVIVA PLUS) test strip Check glucose bid.   hydrocortisone 2.5 % ointment Apply bid to rash (Patient taking differently: Apply 1  application topically 2 (two) times daily as needed (rash).)   insulin detemir (LEVEMIR) 100 UNIT/ML injection Inject 0.2 mLs (20 Units total) into the skin at bedtime. Inject 25 units SQ qhs.   Insulin Pen Needle (PEN NEEDLES) 31G X 6 MM MISC Use as directed with solostar pen.   Insulin Syringe-Needle U-100 (INSULIN SYRINGE .5CC/31GX5/16") 31G X 5/16" 0.5 ML MISC Use as directed qhs.   losartan (COZAAR) 25 MG tablet Take 2 tablets po qd   metFORMIN (GLUCOPHAGE) 500 MG tablet TAKE 1 TABLET (500 MG TOTAL) BY MOUTH 2 (TWO) TIMES DAILY WITH A MEAL.   mupirocin ointment (BACTROBAN) 2 % Apply 1 application topically 2 (two) times daily. (Patient not taking: Reported on 09/10/2021)   triamcinolone cream (KENALOG) 0.1 % Apply 1 application topically 2 (two) times daily. (Patient not taking: Reported on 09/10/2021)   [DISCONTINUED] glipiZIDE (GLUCOTROL) 5 MG tablet Take 1 tablet (5 mg total) by mouth 2 (two) times daily.   [DISCONTINUED] insulin detemir (LEVEMIR) 100 UNIT/ML injection Inject 25 units SQ qhs. (Patient taking  differently: 20-30 Units. Inject 25 units SQ qhs.)   No facility-administered encounter medications on file as of 09/10/2021.    ALLERGIES: Allergies  Allergen Reactions   Lisinopril Cough   Penicillins Rash    Has patient had a PCN reaction causing immediate rash, facial/tongue/throat swelling, SOB or lightheadedness with hypotension: Yes Has patient had a PCN reaction causing severe rash involving mucus membranes or skin necrosis: No Has patient had a PCN reaction that required hospitalization No Has patient had a PCN reaction occurring within the last 10 years: No If all of the above answers are "NO", then may proceed with Cephalosporin use.      VACCINATION STATUS: Immunization History  Administered Date(s) Administered   Influenza,inj,Quad PF,6+ Mos 10/03/2017, 11/06/2018, 09/07/2019   Moderna Sars-Covid-2 Vaccination 01/27/2020, 02/21/2020, 10/31/2020   Pneumococcal Polysaccharide-23 11/06/2018    Diabetes He presents for his initial diabetic visit. He has type 2 diabetes mellitus. Onset time: he was diagnosed at approx age of 7 years. There are no hypoglycemic associated symptoms. Pertinent negatives for hypoglycemia include no confusion, headaches, pallor or seizures. Associated symptoms include polydipsia and polyuria. Pertinent negatives for diabetes include no chest pain, no fatigue, no polyphagia and no weakness. There are no hypoglycemic complications. Symptoms are worsening. There are no diabetic complications. Risk factors for coronary artery disease include dyslipidemia, diabetes mellitus, hypertension and male sex. Current diabetic treatment includes insulin injections. His weight is fluctuating minimally. He is following a generally unhealthy diet. When asked about meal planning, he reported none. He has not had a previous visit with a dietitian. He participates in exercise intermittently. (He did not bring any meter nor logs to review , POC a1c today 10%.) An ACE  inhibitor/angiotensin II receptor blocker is being taken. Eye exam is current.  Hyperlipidemia This is a chronic problem. The current episode started more than 1 year ago. The problem is uncontrolled. Exacerbating diseases include diabetes. Pertinent negatives include no chest pain, myalgias or shortness of breath. He is currently on no antihyperlipidemic treatment. Risk factors for coronary artery disease include dyslipidemia, diabetes mellitus, hypertension and male sex.  Hypertension This is a chronic problem. The current episode started more than 1 year ago. Pertinent negatives include no chest pain, headaches, neck pain, palpitations or shortness of breath. Risk factors for coronary artery disease include dyslipidemia, diabetes mellitus and male gender. Past treatments include angiotensin blockers.    Review of Systems  Constitutional:  Negative for chills, fatigue, fever and unexpected weight change.  HENT:  Negative for dental problem, mouth sores and trouble swallowing.   Eyes:  Negative for visual disturbance.  Respiratory:  Negative for cough, choking, chest tightness, shortness of breath and wheezing.   Cardiovascular:  Negative for chest pain, palpitations and leg swelling.  Gastrointestinal:  Negative for abdominal distention, abdominal pain, constipation, diarrhea, nausea and vomiting.  Endocrine: Positive for polydipsia and polyuria. Negative for polyphagia.  Genitourinary:  Negative for dysuria, flank pain, hematuria and urgency.  Musculoskeletal:  Negative for back pain, gait problem, myalgias and neck pain.  Skin:  Negative for pallor, rash and wound.  Neurological:  Negative for seizures, syncope, weakness, numbness and headaches.  Psychiatric/Behavioral: Negative.  Negative for confusion and dysphoric mood.    Objective:    Vitals with BMI 09/10/2021 04/10/2021 04/01/2021  Height 5' 9" - 5' 9"  Weight 200 lbs 3 oz - 202 lbs 6 oz  BMI 76.72 - 09.47  Systolic 096 283 662   Diastolic 84 94 88  Pulse 88 100 109    BP 138/84   Pulse 88   Ht 5' 9" (1.753 m)   Wt 200 lb 3.2 oz (90.8 kg)   BMI 29.56 kg/m   Wt Readings from Last 3 Encounters:  09/10/21 200 lb 3.2 oz (90.8 kg)  04/01/21 202 lb 6.4 oz (91.8 kg)  11/17/20 194 lb 12.8 oz (88.4 kg)     Physical Exam Constitutional:      General: He is not in acute distress.    Appearance: He is well-developed.  HENT:     Head: Normocephalic and atraumatic.  Neck:     Thyroid: No thyromegaly.     Trachea: No tracheal deviation.  Cardiovascular:     Rate and Rhythm: Normal rate.     Pulses:          Dorsalis pedis pulses are 1+ on the right side and 1+ on the left side.       Posterior tibial pulses are 1+ on the right side and 1+ on the left side.     Heart sounds: Normal heart sounds, S1 normal and S2 normal. No murmur heard.   No gallop.  Pulmonary:     Effort: No respiratory distress.     Breath sounds: Normal breath sounds. No wheezing.  Abdominal:     General: Bowel sounds are normal. There is no distension.     Palpations: Abdomen is soft.     Tenderness: There is no abdominal tenderness. There is no guarding.  Musculoskeletal:     Right shoulder: No swelling or deformity.     Cervical back: Normal range of motion and neck supple.  Skin:    General: Skin is warm and dry.     Findings: No rash.     Nails: There is no clubbing.  Neurological:     Mental Status: He is alert and oriented to person, place, and time.     Cranial Nerves: No cranial nerve deficit.     Sensory: No sensory deficit.     Gait: Gait normal.     Deep Tendon Reflexes: Reflexes are normal and symmetric.  Psychiatric:        Speech: Speech normal.        Behavior: Behavior normal. Behavior is cooperative.        Thought Content: Thought content normal.        Judgment: Judgment normal.      CMP (  most recent) CMP     Component Value Date/Time   NA 140 03/30/2021 0852   K 5.0 03/30/2021 0852   CL 102  03/30/2021 0852   CO2 21 03/30/2021 0852   GLUCOSE 165 (H) 03/30/2021 0852   GLUCOSE 125 (H) 05/27/2020 1037   BUN 14 03/30/2021 0852   CREATININE 1.66 (H) 03/30/2021 0852   CREATININE 1.22 09/09/2014 0910   CALCIUM 10.0 03/30/2021 0852   PROT 7.3 03/30/2021 0852   ALBUMIN 4.3 03/30/2021 0852   AST 29 03/30/2021 0852   ALT 58 (H) 03/30/2021 0852   ALKPHOS 91 03/30/2021 0852   BILITOT 0.3 03/30/2021 0852   GFRNONAA 50 (L) 11/18/2020 0830   GFRAA 57 (L) 11/18/2020 0830     Diabetic Labs (most recent): Lab Results  Component Value Date   HGBA1C 10.0 (A) 09/10/2021   HGBA1C 8.8 (H) 03/30/2021   HGBA1C 12.2 (H) 11/18/2020     Lipid Panel ( most recent) Lipid Panel     Component Value Date/Time   CHOL 193 03/30/2021 0852   TRIG 257 (H) 03/30/2021 0852   HDL 41 03/30/2021 0852   CHOLHDL 4.7 03/30/2021 0852   CHOLHDL 3.1 09/09/2014 0910   VLDL 17 09/09/2014 0910   LDLCALC 108 (H) 03/30/2021 0852   LABVLDL 44 (H) 03/30/2021 0852      Lab Results  Component Value Date   TSH 1.878 10/01/2014           Assessment & Plan:   1. Type 2 diabetes mellitus without complication, without long-term current use of insulin ([facility]CC)  - Philip Caldwell has currently uncontrolled symptomatic type 2 DM since  55 years of age,  with most recent A1c of 10 %. Recent labs reviewed. - I had a long discussion with him about the progressive nature of diabetes and the pathology behind its complications. - He denies any gross complications of diabetes , however he  remains at a high risk for more acute and chronic complications which include CAD, CVA, CKD, retinopathy, and neuropathy. These are all discussed in detail with him.  - I have counseled him on diet  and weight management  by adopting a carbohydrate restricted/protein rich diet. Patient is encouraged to switch to  unprocessed or minimally processed     complex starch and increased protein intake (animal or plant source), fruits, and  vegetables. -  he is advised to stick to a routine mealtimes to eat 3 meals  a day and avoid unnecessary snacks ( to snack only to correct hypoglycemia).   - he acknowledges that there is a room for improvement in his food and drink choices. - Suggestion is made for him to avoid simple carbohydrates  from his diet including Cakes, Sweet Desserts, Ice Cream, Soda (diet and regular), Sweet Tea, Candies, Chips, Cookies, Store Bought Juices, Alcohol in Excess of  1-2 drinks a day, Artificial Sweeteners,  Coffee Creamer, and "Sugar-free" Products. This will help patient to have more stable blood glucose profile and potentially avoid unintended weight gain.  - he will be scheduled with Jearld Fenton, RDN, CDE for diabetes education.  - I have approached him with the following individualized plan to manage  his diabetes and patient agrees:   - I had a long discussion about ITLC to address his diabetes, hyperlipidemia, and HTN.  I recommendedwhle food plant predominant diet, and he is in agreement. - In light of his prevailing glycemic burden, he is advised to continue Levemir at 20 units  qhs, MTF 500 mg po BID an dlowe glipizide to 5 mg po daily at breakfast. - he is advised to monitor   glucose 4 times a day-before meals and at bedtime and return with logs and meter in 10 days. - he is warned not to take insulin without proper monitoring per orders. - Adjustment parameters are given to him for hypo and hyperglycemia in writing. - he is encouraged to call clinic for blood glucose levels less than 70 or above 200 mg /dl.  - he will be considered for incretin therapy as appropriate next visit.  - Specific targets for  A1c;  LDL, HDL,  and Triglycerides were discussed with the patient.  2) Blood Pressure /Hypertension:  his blood pressure is  controlled to target.   he is advised to continue his current medications including losartan 25 mg p.o. daily with breakfast . 3) Lipids/Hyperlipidemia: His recent  lipid panel showed uncontrolled dyslipidemia.  Patient is not on statins.  The above discussed diet will help with dyslipidemia also.  He will be offered fasting lipid panel in 6 months.   4)  Weight/Diet:  Body mass index is 29.56 kg/m.  -   clearly complicating his diabetes care.   he is  a candidate for weight loss. I discussed with him the fact that loss of 5 - 10% of his  current body weight will have the most impact on his diabetes management.  Exercise, and detailed carbohydrates information provided  -  detailed on discharge instructions.  5) Chronic Care/Health Maintenance:  -he  is on ACEI/ARB  medications and  is encouraged to initiate and continue to follow up with Ophthalmology, Dentist,  Podiatrist at least yearly or according to recommendations, and advised to   stay away from smoking. I have recommended yearly flu vaccine and pneumonia vaccine at least every 5 years; moderate intensity exercise for up to 150 minutes weekly; and  sleep for at least 7 hours a day.  - he is  advised to maintain close follow up with Erven Colla, DO for primary care needs, as well as his other providers for optimal and coordinated care.   I spent 82 minutes in the care of the patient today including review of labs from [facility], Lipids, Thyroid Function, Hematology (current and previous including abstractions from other facilities); face-to-face time discussing  his blood glucose readings/logs, discussing hypoglycemia and hyperglycemia episodes and symptoms, medications doses, his options of short and long term treatment based on the latest standards of care / guidelines;  discussion about incorporating lifestyle medicine;  and documenting the encounter.     Please refer to Patient Instructions for Blood Glucose Monitoring and Insulin/Medications Dosing Guide"  in media tab for additional information. Please  also refer to " Patient Self Inventory" in the Media  tab for reviewed elements of pertinent  patient history.  Cathlean Marseilles participated in the discussions, expressed understanding, and voiced agreement with the above plans.  All questions were answered to his satisfaction. he is encouraged to contact clinic should he have any questions or concerns prior to his return visit.   Follow up plan: - Return in about 10 days (around 09/20/2021) for F/U with Meter and Logs Only - no Labs.  Glade Lloyd, MD [facility] [address] [place], [place] [address] Phone: [phone]  Fax: [phone]    09/10/2021, 3:45 PM  This note was partially dictated with voice recognition software. Similar sounding words can  be transcribed inadequately or may not  be corrected upon review.

## 2021-09-17 ENCOUNTER — Other Ambulatory Visit: Payer: Self-pay | Admitting: Family Medicine

## 2021-09-24 ENCOUNTER — Encounter: Payer: Self-pay | Admitting: "Endocrinology

## 2021-09-24 ENCOUNTER — Other Ambulatory Visit: Payer: Self-pay

## 2021-09-24 ENCOUNTER — Ambulatory Visit (INDEPENDENT_AMBULATORY_CARE_PROVIDER_SITE_OTHER): Payer: 59 | Admitting: "Endocrinology

## 2021-09-24 VITALS — BP 116/82 | HR 88 | Ht 69.0 in | Wt 196.6 lb

## 2021-09-24 DIAGNOSIS — E119 Type 2 diabetes mellitus without complications: Secondary | ICD-10-CM

## 2021-09-24 DIAGNOSIS — E782 Mixed hyperlipidemia: Secondary | ICD-10-CM | POA: Diagnosis not present

## 2021-09-24 DIAGNOSIS — I10 Essential (primary) hypertension: Secondary | ICD-10-CM

## 2021-09-24 NOTE — Patient Instructions (Signed)

## 2021-09-24 NOTE — Progress Notes (Signed)
09/24/2021, 12:53 PM  Endocrinology follow-up note   Subjective:    Patient ID: Philip Caldwell, male    DOB: Feb 20, 1966.  Philip Caldwell is being seen in follow-up after he was seen in consultation for management of currently uncontrolled symptomatic diabetes requested by  Erven Colla, DO.   Past Medical History:  Diagnosis Date   A-fib (HCC)    Arm pain, central    with intermittent numbness   Diabetes mellitus without complication ([place])    Hypertension    Neck pain    Renal disorder    Tendonitis     Past Surgical History:  Procedure Laterality Date   COLONOSCOPY N/A 10/08/2015   Procedure: COLONOSCOPY;  Surgeon: Rogene Houston, MD;  Location: [facility];  Service: Endoscopy;  Laterality: N/A;  [place] Left 05/25/2020   Procedure: CYSTOSCOPY WITH STENT PLACEMENT retrograde pylegram;  Surgeon: Lucas Mallow, MD;  Location: [facility];  Service: Urology;  Laterality: Left;   NERVE SURGERY      Social History   Socioeconomic History   Marital status: Married    Spouse name: Not on file   Number of children: Not on file   Years of education: Not on file   Highest education level: Not on file  Occupational History   Not on file  Tobacco Use   Smoking status: Never   Smokeless tobacco: Never  Vaping Use   Vaping Use: Never used  Substance and Sexual Activity   Alcohol use: No   Drug use: No   Sexual activity: Yes    Birth control/protection: None  Other Topics Concern   Not on file  Social History Narrative   Not on file   Social Determinants of Health   Financial Resource Strain: Not on file  Food Insecurity: Not on file  Transportation Needs: Not on file  Physical Activity: Not on file  Stress: Not on file  Social Connections: Not on file    Family History  Problem Relation Age of Onset   Thyroid disease Mother    Dementia Mother     Hypertension Father    Cancer Father    Heart attack Brother     Outpatient Encounter Medications as of 09/24/2021  Medication Sig   Accu-Chek Softclix Lancets lancets Check glucose 3x per day.   Ascorbic Acid (VITAMIN C PO) Take 1 tablet by mouth 3 (three) times a week.   blood glucose meter kit and supplies KIT Dispense based on patient and insurance preference. Use up to four times daily as directed. (FOR ICD-9 250.00, 250.01).   blood glucose meter kit and supplies KIT Dispense based on patient and insurance preference. Use up to test glucose daily.  (FOR ICD - 10)   Cholecalciferol (VITAMIN D3 PO) Take 1 tablet by mouth 3 (three) times a week.   glucose blood (ACCU-CHEK AVIVA PLUS) test strip Check glucose bid.   hydrocortisone 2.5 % ointment Apply bid to rash (Patient taking differently: Apply 1 application topically 2 (two) times daily as needed (rash).)   insulin detemir (LEVEMIR) 100 UNIT/ML injection Inject 0.2 mLs (20 Units  total) into the skin at bedtime. Inject 25 units SQ qhs. (Patient taking differently: Inject 20 Units into the skin at bedtime.)   Insulin Pen Needle (PEN NEEDLES) 31G X 6 MM MISC Use as directed with solostar pen.   Insulin Syringe-Needle U-100 (INSULIN SYRINGE .5CC/31GX5/16") 31G X 5/16" 0.5 ML MISC Use as directed qhs.   losartan (COZAAR) 25 MG tablet Take 2 tablets po qd   metFORMIN (GLUCOPHAGE) 500 MG tablet APPT REQUIRED TAKE 1 TABLET BY MOUTH 2 TIMES DAILY WITH A MEAL   mupirocin ointment (BACTROBAN) 2 % Apply 1 application topically 2 (two) times daily. (Patient not taking: Reported on 09/10/2021)   triamcinolone cream (KENALOG) 0.1 % Apply 1 application topically 2 (two) times daily. (Patient not taking: Reported on 09/10/2021)   [DISCONTINUED] glipiZIDE (GLUCOTROL) 5 MG tablet Take 1 tablet (5 mg total) by mouth daily with breakfast.   No facility-administered encounter medications on file as of 09/24/2021.    ALLERGIES: Allergies  Allergen  Reactions   Lisinopril Cough   Penicillins Rash    Has patient had a PCN reaction causing immediate rash, facial/tongue/throat swelling, SOB or lightheadedness with hypotension: Yes Has patient had a PCN reaction causing severe rash involving mucus membranes or skin necrosis: No Has patient had a PCN reaction that required hospitalization No Has patient had a PCN reaction occurring within the last 10 years: No If all of the above answers are "NO", then may proceed with Cephalosporin use.      VACCINATION STATUS: Immunization History  Administered Date(s) Administered   Influenza,inj,Quad PF,6+ Mos 10/03/2017, 11/06/2018, 09/07/2019   Moderna Sars-Covid-2 Vaccination 01/27/2020, 02/21/2020, 10/31/2020   Pneumococcal Polysaccharide-23 11/06/2018    Diabetes He presents for his follow-up diabetic visit. He has type 2 diabetes mellitus. Onset time: he was diagnosed at approx age of 17 years. His disease course has been improving. There are no hypoglycemic associated symptoms. Pertinent negatives for hypoglycemia include no confusion, headaches, pallor or seizures. Associated symptoms include polydipsia and polyuria. Pertinent negatives for diabetes include no chest pain, no fatigue, no polyphagia and no weakness. There are no hypoglycemic complications. Symptoms are improving. There are no diabetic complications. Risk factors for coronary artery disease include dyslipidemia, diabetes mellitus, hypertension and male sex. Current diabetic treatment includes insulin injections. He is compliant with treatment all of the time. His weight is fluctuating minimally. He has not had a previous visit with a dietitian. He participates in exercise intermittently. His home blood glucose trend is decreasing steadily. His breakfast blood glucose range is generally 130-140 mg/dl. His lunch blood glucose range is generally 110-130 mg/dl. His dinner blood glucose range is generally 130-140 mg/dl. His bedtime blood  glucose range is generally 130-140 mg/dl. His overall blood glucose range is 130-140 mg/dl. (He did bring his meter and logs showing average blood glucose of 135 for the last 7 days.  He made significant changes to his lifestyle, improving his EAG down from 240+.  His recent A1c was 10%.) An ACE inhibitor/angiotensin II receptor blocker is being taken. Eye exam is current.  Hyperlipidemia This is a chronic problem. The current episode started more than 1 year ago. The problem is uncontrolled. Exacerbating diseases include diabetes. Pertinent negatives include no chest pain, myalgias or shortness of breath. He is currently on no antihyperlipidemic treatment. Risk factors for coronary artery disease include dyslipidemia, diabetes mellitus, hypertension and male sex.  Hypertension This is a chronic problem. The current episode started more than 1 year ago. The problem is controlled.  Pertinent negatives include no chest pain, headaches, neck pain, palpitations or shortness of breath. Risk factors for coronary artery disease include dyslipidemia, diabetes mellitus and male gender. Past treatments include angiotensin blockers.    Review of Systems  Constitutional:  Negative for chills, fatigue, fever and unexpected weight change.  HENT:  Negative for dental problem, mouth sores and trouble swallowing.   Eyes:  Negative for visual disturbance.  Respiratory:  Negative for cough, choking, chest tightness, shortness of breath and wheezing.   Cardiovascular:  Negative for chest pain, palpitations and leg swelling.  Gastrointestinal:  Negative for abdominal distention, abdominal pain, constipation, diarrhea, nausea and vomiting.  Endocrine: Positive for polydipsia and polyuria. Negative for polyphagia.  Genitourinary:  Negative for dysuria, flank pain, hematuria and urgency.  Musculoskeletal:  Negative for back pain, gait problem, myalgias and neck pain.  Skin:  Negative for pallor, rash and wound.   Neurological:  Negative for seizures, syncope, weakness, numbness and headaches.  Psychiatric/Behavioral: Negative.  Negative for confusion and dysphoric mood.    Objective:    Vitals with BMI 09/24/2021 09/10/2021 04/10/2021  Height 5' 9"  5' 9"  -  Weight 196 lbs 10 oz 200 lbs 3 oz -  BMI 42.68 34.19 -  Systolic 622 297 989  Diastolic 82 84 94  Pulse 88 88 100    BP 116/82   Pulse 88   Ht 5' 9"  (1.753 m)   Wt 196 lb 9.6 oz (89.2 kg)   BMI 29.03 kg/m   Wt Readings from Last 3 Encounters:  09/24/21 196 lb 9.6 oz (89.2 kg)  09/10/21 200 lb 3.2 oz (90.8 kg)  04/01/21 202 lb 6.4 oz (91.8 kg)     Physical Exam Constitutional:      General: He is not in acute distress.    Appearance: He is well-developed.  HENT:     Head: Normocephalic and atraumatic.  Neck:     Thyroid: No thyromegaly.     Trachea: No tracheal deviation.  Cardiovascular:     Rate and Rhythm: Normal rate.     Pulses:          Dorsalis pedis pulses are 1+ on the right side and 1+ on the left side.       Posterior tibial pulses are 1+ on the right side and 1+ on the left side.     Heart sounds: Normal heart sounds, S1 normal and S2 normal. No murmur heard.   No gallop.  Pulmonary:     Effort: No respiratory distress.     Breath sounds: Normal breath sounds. No wheezing.  Abdominal:     General: Bowel sounds are normal. There is no distension.     Palpations: Abdomen is soft.     Tenderness: There is no abdominal tenderness. There is no guarding.  Musculoskeletal:     Right shoulder: No swelling or deformity.     Cervical back: Normal range of motion and neck supple.  Skin:    General: Skin is warm and dry.     Findings: No rash.     Nails: There is no clubbing.  Neurological:     Mental Status: He is alert and oriented to person, place, and time.     Cranial Nerves: No cranial nerve deficit.     Sensory: No sensory deficit.     Gait: Gait normal.     Deep Tendon Reflexes: Reflexes are normal and  symmetric.  Psychiatric:        Speech: Speech  normal.        Behavior: Behavior normal. Behavior is cooperative.        Thought Content: Thought content normal.        Judgment: Judgment normal.    CMP ( most recent) CMP     Component Value Date/Time   NA 140 03/30/2021 0852   K 5.0 03/30/2021 0852   CL 102 03/30/2021 0852   CO2 21 03/30/2021 0852   GLUCOSE 165 (H) 03/30/2021 0852   GLUCOSE 125 (H) 05/27/2020 1037   BUN 14 03/30/2021 0852   CREATININE 1.66 (H) 03/30/2021 0852   CREATININE 1.22 09/09/2014 0910   CALCIUM 10.0 03/30/2021 0852   PROT 7.3 03/30/2021 0852   ALBUMIN 4.3 03/30/2021 0852   AST 29 03/30/2021 0852   ALT 58 (H) 03/30/2021 0852   ALKPHOS 91 03/30/2021 0852   BILITOT 0.3 03/30/2021 0852   GFRNONAA 50 (L) 11/18/2020 0830   GFRAA 57 (L) 11/18/2020 0830     Diabetic Labs (most recent): Lab Results  Component Value Date   HGBA1C 10.0 (A) 09/10/2021   HGBA1C 8.8 (H) 03/30/2021   HGBA1C 12.2 (H) 11/18/2020     Lipid Panel ( most recent) Lipid Panel     Component Value Date/Time   CHOL 193 03/30/2021 0852   TRIG 257 (H) 03/30/2021 0852   HDL 41 03/30/2021 0852   CHOLHDL 4.7 03/30/2021 0852   CHOLHDL 3.1 09/09/2014 0910   VLDL 17 09/09/2014 0910   LDLCALC 108 (H) 03/30/2021 0852   LABVLDL 44 (H) 03/30/2021 0852      Lab Results  Component Value Date   TSH 1.878 10/01/2014      Assessment & Plan:   1. Type 2 diabetes mellitus without complication, without long-term current use of insulin (HCC)  - Philip Caldwell has currently uncontrolled symptomatic type 2 DM since  55 years of age.  He did bring his meter and logs showing average blood glucose of 135 for the last 7 days.  He made significant changes to his lifestyle, improving his EAG down from 240+.  His recent A1c was 10%.  - Recent labs reviewed. - I had a long discussion with him about the progressive nature of diabetes and the pathology behind its complications. - He denies any  gross complications of diabetes , however he  remains at a high risk for more acute and chronic complications which include CAD, CVA, CKD, retinopathy, and neuropathy. These are all discussed in detail with him.  - I have counseled him on diet  and weight management  by adopting a carbohydrate restricted/protein rich diet. Patient is encouraged to switch to  unprocessed or minimally processed     complex starch and increased protein intake (animal or plant source), fruits, and vegetables. -  he is advised to stick to a routine mealtimes to eat 3 meals  a day and avoid unnecessary snacks ( to snack only to correct hypoglycemia).   - he acknowledges that there is a room for improvement in his food and drink choices. - Suggestion is made for him to avoid simple carbohydrates  from his diet including Cakes, Sweet Desserts, Ice Cream, Soda (diet and regular), Sweet Tea, Candies, Chips, Cookies, Store Bought Juices, Alcohol in Excess of  1-2 drinks a day, Artificial Sweeteners,  Coffee Creamer, and "Sugar-free" Products, Lemonade. This will help patient to have more stable blood glucose profile and potentially avoid unintended weight gain.   - he will be scheduled with Jearld Fenton,  RDN, CDE for diabetes education.  - I have approached him with the following individualized plan to manage  his diabetes and patient agrees:   -He has engaged very well with his eye TLC to address his diabetes, hyperlipidemia, hypertension.   -I advised him to continue on the recommended  whole food plant predominant diet, and he is in agreement. - In light of his prevailing glycemic burden, he is advised to continue Levemir at 20 units qhs, MTF 500 mg po BID and discontinue glipizide for now.    -He is advised to monitor blood glucose twice daily before breakfast and at bedtime.  - Adjustment parameters are given to him for hypo and hyperglycemia in writing. - he is encouraged to call clinic for blood glucose levels less  than 70 or above 150 mg /dl.  - he will be considered for incretin therapy as appropriate next visit.  - Specific targets for  A1c;  LDL, HDL,  and Triglycerides were discussed with the patient.  2) Blood Pressure /Hypertension: His blood pressure is controlled to target.  he is advised to continue his current medications including losartan 25 mg p.o. daily with breakfast .  3) Lipids/Hyperlipidemia: His recent lipid panel showed uncontrolled dyslipidemia.  Patient is not on statins.  The above discussed diet will help with dyslipidemia also.  He will be offered fasting lipid panel in 6 months.   4)  Weight/Diet:  Body mass index is 29.03 kg/m.  -   clearly complicating his diabetes care.   he is  a candidate for weight loss. I discussed with him the fact that loss of 5 - 10% of his  current body weight will have the most impact on his diabetes management.  Exercise, and detailed carbohydrates information provided  -  detailed on discharge instructions.  5) Chronic Care/Health Maintenance:  -he  is on ACEI/ARB  medications and  is encouraged to initiate and continue to follow up with Ophthalmology, Dentist,  Podiatrist at least yearly or according to recommendations, and advised to   stay away from smoking. I have recommended yearly flu vaccine and pneumonia vaccine at least every 5 years; moderate intensity exercise for up to 150 minutes weekly; and  sleep for at least 7 hours a day.  - he is  advised to maintain close follow up with Erven Colla, DO for primary care needs, as well as his other providers for optimal and coordinated care.    I spent 44 minutes in the care of the patient today including review of labs from [place], Lipids, Thyroid Function, Hematology (current and previous including abstractions from other facilities); face-to-face time discussing  his blood glucose readings/logs, discussing hypoglycemia and hyperglycemia episodes and symptoms, medications doses, his options of  short and long term treatment based on the latest standards of care / guidelines;  discussion about incorporating lifestyle medicine;  and documenting the encounter.    Please refer to Patient Instructions for Blood Glucose Monitoring and Insulin/Medications Dosing Guide"  in media tab for additional information. Please  also refer to " Patient Self Inventory" in the Media  tab for reviewed elements of pertinent patient history.  Cathlean Marseilles participated in the discussions, expressed understanding, and voiced agreement with the above plans.  All questions were answered to his satisfaction. he is encouraged to contact clinic should he have any questions or concerns prior to his return visit.   Follow up plan: - Return in about 3 months (around 12/25/2021) for F/U  with Pre-visit Labs, Meter, Logs, A1c here.Glade Lloyd, MD [facility] [address] [place], [place] [address] Phone: [phone]  Fax: [phone]    09/24/2021, 12:53 PM  This note was partially dictated with voice recognition software. Similar sounding words can be transcribed inadequately or may not  be corrected upon review.

## 2021-09-30 ENCOUNTER — Other Ambulatory Visit: Payer: Self-pay | Admitting: Family Medicine

## 2021-09-30 DIAGNOSIS — E1165 Type 2 diabetes mellitus with hyperglycemia: Secondary | ICD-10-CM

## 2021-10-08 ENCOUNTER — Other Ambulatory Visit: Payer: Self-pay | Admitting: Family Medicine

## 2021-10-08 MED ORDER — HYDROCORTISONE 2.5 % EX OINT: 1.0000 "application " | TOPICAL_OINTMENT | Freq: Two times a day (BID) | CUTANEOUS | 4 refills | Status: AC | PRN

## 2021-10-08 NOTE — Telephone Encounter (Signed)
Patient is requesting prescription on hydrocortisone  2.5 ointment called into  [facility] [place]

## 2021-10-14 ENCOUNTER — Encounter: Payer: 59 | Attending: Family Medicine | Admitting: Nutrition

## 2021-10-14 ENCOUNTER — Encounter: Payer: Self-pay | Admitting: Nutrition

## 2021-10-14 VITALS — Wt 199.0 lb

## 2021-10-14 DIAGNOSIS — E119 Type 2 diabetes mellitus without complications: Secondary | ICD-10-CM | POA: Insufficient documentation

## 2021-10-14 NOTE — Progress Notes (Signed)
Medical Nutrition Therapy  Appointment Start time:  1330  Appointment End time:  73  Primary concerns today: DM Type 2  Referral diagnosis: E11.8 Preferred learning style: no preference  Learning readiness: Ready, change in progress   NUTRITION ASSESSMENT  Sees Dr. Dorris Fetch, REI. Anthropometrics  Wt Readings from Last 3 Encounters:  09/24/21 196 lb 9.6 oz (89.2 kg)  09/10/21 200 lb 3.2 oz (90.8 kg)  04/01/21 202 lb 6.4 oz (91.8 kg)   Ht Readings from Last 3 Encounters:  09/24/21 5\' 9"  (1.753 m)  09/10/21 5\' 9"  (1.753 m)  04/01/21 5\' 9"  (1.753 m)   There is no height or weight on file to calculate BMI. @BMIFA @ Facility age limit for growth percentiles is 20 years. Facility age limit for growth percentiles is 20 years.    Clinical Medical Hx: See chart Medications: Levermeir 20 units a day. Metformin 500 mg BID. Labs: FBS:   AVG 7 dat 148 mg/dl. 54% in range Lab Results  Component Value Date   HGBA1C 10.0 (A) 09/10/2021   CMP Latest Ref Rng & Units 03/30/2021 11/18/2020 05/27/2020  Glucose 65 - 99 mg/dL 165(H) 352(H) 125(H)  BUN 6 - 24 mg/dL 14 16 16   Creatinine 0.76 - 1.27 mg/dL 1.66(H) 1.54(H) 1.56(H)  Sodium 134 - 144 mmol/L 140 135 140  Potassium 3.5 - 5.2 mmol/L 5.0 5.0 4.2  Chloride 96 - 106 mmol/L 102 95(L) 105  CO2 20 - 29 mmol/L 21 24 23   Calcium 8.7 - 10.2 mg/dL 10.0 9.3 8.7(L)  Total Protein 6.0 - 8.5 g/dL 7.3 7.2 -  Total Bilirubin 0.0 - 1.2 mg/dL 0.3 0.3 -  Alkaline Phos 44 - 121 IU/L 91 113 -  AST 0 - 40 IU/L 29 26 -  ALT 0 - 44 IU/L 58(H) 43 -   Lipid Panel     Component Value Date/Time   CHOL 193 03/30/2021 0852   TRIG 257 (H) 03/30/2021 0852   HDL 41 03/30/2021 0852   CHOLHDL 4.7 03/30/2021 0852   CHOLHDL 3.1 09/09/2014 0910   VLDL 17 09/09/2014 0910   LDLCALC 108 (H) 03/30/2021 0852   LABVLDL 44 (H) 03/30/2021 7353    Notable Signs/Symptoms: None  Lifestyle & Dietary Hx LIves with his wife.   Estimated daily fluid intake: 48-60  oz Supplements: none Sleep: 8 Stress / self-care: none Current average weekly physical activity: none  24-Hr Dietary Recall First Meal: 1/2 oatmeal, apple and banana 1/2, water Snack:  Second Meal: Fish-fried, slaw, water Snack: pineapple Third Meal: Salmon and cabbage, water Snack:  Beverages: water   Estimated Energy Needs Calories: 1800  Carbohydrate: 200g Protein: 136g Fat: 50g   NUTRITION DIAGNOSIS  NB-1.1 Food and nutrition-related knowledge deficit As related to Diabetes Type 2.  As evidenced by A1C 10%.   NUTRITION INTERVENTION  Nutrition education (E-1) on the following topics:  Nutrition and Diabetes education provided on My Plate, CHO counting, meal planning, portion sizes, timing of meals, avoiding snacks between meals unless having a low blood sugar, target ranges for A1C and blood sugars, signs/symptoms and treatment of hyper/hypoglycemia, monitoring blood sugars, taking medications as prescribed, benefits of exercising 30 minutes per day and prevention of complications of DM.   Handouts Provided Include  My Plant based foods Meal Plan Card  Learning Style & Readiness for Change Teaching method utilized: Visual & Auditory  Demonstrated degree of understanding via: Teach Back  Barriers to learning/adherence to lifestyle change: none  Goals Established by Pt Goals Follow  Plant Based Eat 45-60 g CHO per meal Choose egg beaters. Drink a gallon of water per day Exercise 150 minutes a week or more. Add in some strength training throughout the week.   MONITORING & EVALUATION Dietary intake, weekly physical activity, and blood sugars in 3 months.  Next Steps  Patient is to work on more plant based foods.Marland Kitchen

## 2021-10-14 NOTE — Patient Instructions (Signed)
Goals Established by Pt Goals Follow Plant Based Eat 45-60 g CHO per meal Choose egg beaters. Drink a gallon of water per day Exercise 150 minutes a week or more. Add in some strength training throughout the week.

## 2021-10-20 ENCOUNTER — Other Ambulatory Visit: Payer: Self-pay | Admitting: Nurse Practitioner

## 2021-11-13 ENCOUNTER — Other Ambulatory Visit: Payer: Self-pay | Admitting: Family Medicine

## 2021-11-30 ENCOUNTER — Telehealth: Payer: Self-pay | Admitting: Family Medicine

## 2021-12-01 NOTE — Telephone Encounter (Signed)
Pt called and stated his Metformin was not filled and he is completely out. [facility]. [phone]

## 2021-12-02 ENCOUNTER — Other Ambulatory Visit: Payer: Self-pay | Admitting: "Endocrinology

## 2021-12-02 ENCOUNTER — Telehealth: Payer: Self-pay | Admitting: "Endocrinology

## 2021-12-02 ENCOUNTER — Other Ambulatory Visit: Payer: Self-pay | Admitting: Family Medicine

## 2021-12-02 MED ORDER — METFORMIN HCL 500 MG PO TABS: ORAL_TABLET | ORAL | 1 refills | Status: DC

## 2021-12-02 NOTE — Telephone Encounter (Signed)
Noted  

## 2021-12-02 NOTE — Telephone Encounter (Signed)
Pt called and states he is out of his metformin all week and his pcp will not fill it since he is now seeing Dr Dorris Fetch. Please advise.   [id number] [place] [place] Phone:  [phone]  Fax:  [phone]

## 2021-12-03 NOTE — Telephone Encounter (Signed)
Spoke with nurse.Sees specialist Dr. Dorris Fetch for refills.

## 2021-12-11 LAB — COMPREHENSIVE METABOLIC PANEL
ALT: 36 IU/L (ref 0–44)
AST: 25 IU/L (ref 0–40)
Albumin/Globulin Ratio: 1.5 (ref 1.2–2.2)
Albumin: 4.3 g/dL (ref 3.8–4.9)
Alkaline Phosphatase: 94 IU/L (ref 44–121)
BUN/Creatinine Ratio: 11 (ref 9–20)
BUN: 17 mg/dL (ref 6–24)
Bilirubin Total: 0.3 mg/dL (ref 0.0–1.2)
CO2: 22 mmol/L (ref 20–29)
Calcium: 9.9 mg/dL (ref 8.7–10.2)
Chloride: 100 mmol/L (ref 96–106)
Creatinine, Ser: 1.49 mg/dL — ABNORMAL HIGH (ref 0.76–1.27)
Globulin, Total: 2.8 g/dL (ref 1.5–4.5)
Glucose: 139 mg/dL — ABNORMAL HIGH (ref 70–99)
Potassium: 4.8 mmol/L (ref 3.5–5.2)
Sodium: 138 mmol/L (ref 134–144)
Total Protein: 7.1 g/dL (ref 6.0–8.5)
eGFR: 54 mL/min/{1.73_m2} — ABNORMAL LOW (ref >59)

## 2021-12-11 LAB — LIPID PANEL
Chol/HDL Ratio: 4.8 ratio (ref 0.0–5.0)
Cholesterol, Total: 202 mg/dL — ABNORMAL HIGH (ref 100–199)
HDL: 42 mg/dL (ref >39)
LDL Chol Calc (NIH): 124 mg/dL — ABNORMAL HIGH (ref 0–99)
Triglycerides: 202 mg/dL — ABNORMAL HIGH (ref 0–149)
VLDL Cholesterol Cal: 36 mg/dL (ref 5–40)

## 2021-12-11 LAB — TSH: TSH: 2.44 u[IU]/mL (ref 0.450–4.500)

## 2021-12-11 LAB — VITAMIN D 25 HYDROXY (VIT D DEFICIENCY, FRACTURES): Vit D, 25-Hydroxy: 41.9 ng/mL (ref 30.0–100.0)

## 2021-12-11 LAB — T4, FREE: Free T4: 1.15 ng/dL (ref 0.82–1.77)

## 2021-12-28 ENCOUNTER — Other Ambulatory Visit: Payer: Self-pay

## 2021-12-28 ENCOUNTER — Ambulatory Visit (INDEPENDENT_AMBULATORY_CARE_PROVIDER_SITE_OTHER): Payer: 59 | Admitting: "Endocrinology

## 2021-12-28 ENCOUNTER — Telehealth: Payer: Self-pay | Admitting: "Endocrinology

## 2021-12-28 ENCOUNTER — Encounter: Payer: Self-pay | Admitting: "Endocrinology

## 2021-12-28 ENCOUNTER — Other Ambulatory Visit: Payer: Self-pay | Admitting: Family Medicine

## 2021-12-28 ENCOUNTER — Encounter: Payer: 59 | Attending: "Endocrinology | Admitting: Nutrition

## 2021-12-28 ENCOUNTER — Encounter: Payer: Self-pay | Admitting: Nutrition

## 2021-12-28 VITALS — BP 120/78 | HR 76 | Ht 69.0 in | Wt 197.0 lb

## 2021-12-28 DIAGNOSIS — I10 Essential (primary) hypertension: Secondary | ICD-10-CM

## 2021-12-28 DIAGNOSIS — E782 Mixed hyperlipidemia: Secondary | ICD-10-CM

## 2021-12-28 DIAGNOSIS — E119 Type 2 diabetes mellitus without complications: Secondary | ICD-10-CM

## 2021-12-28 DIAGNOSIS — E1165 Type 2 diabetes mellitus with hyperglycemia: Secondary | ICD-10-CM

## 2021-12-28 LAB — POCT GLYCOSYLATED HEMOGLOBIN (HGB A1C): HbA1c, POC (controlled diabetic range): 8 % — AB (ref 0.0–7.0)

## 2021-12-28 MED ORDER — INSULIN DETEMIR 100 UNIT/ML ~~LOC~~ SOLN: 30.0000 [IU] | Freq: Every day | SUBCUTANEOUS | 1 refills | Status: DC

## 2021-12-28 MED ORDER — "INSULIN SYRINGE 31G X 5/16"" 0.5 ML MISC": 3 refills | Status: DC

## 2021-12-28 NOTE — Progress Notes (Signed)
12/28/2021, 1:08 PM  Endocrinology follow-up note   Subjective:    Patient ID: Philip Caldwell, male    DOB: 1966/01/27.  Philip Caldwell is being seen in follow-up after he was seen in consultation for management of currently uncontrolled symptomatic diabetes requested by  Coral Spikes, DO.   Past Medical History:  Diagnosis Date   A-fib (HCC)    Arm pain, central    with intermittent numbness   Diabetes mellitus without complication ([place])    Hypertension    Neck pain    Renal disorder    Tendonitis     Past Surgical History:  Procedure Laterality Date   COLONOSCOPY N/A 10/08/2015   Procedure: COLONOSCOPY;  Surgeon: Rogene Houston, MD;  Location: [facility];  Service: Endoscopy;  Laterality: N/A;  [place] Left 05/25/2020   Procedure: CYSTOSCOPY WITH STENT PLACEMENT retrograde pylegram;  Surgeon: Lucas Mallow, MD;  Location: [facility];  Service: Urology;  Laterality: Left;   NERVE SURGERY      Social History   Socioeconomic History   Marital status: Married    Spouse name: Not on file   Number of children: Not on file   Years of education: Not on file   Highest education level: Not on file  Occupational History   Not on file  Tobacco Use   Smoking status: Never   Smokeless tobacco: Never  Vaping Use   Vaping Use: Never used  Substance and Sexual Activity   Alcohol use: No   Drug use: No   Sexual activity: Yes    Birth control/protection: None  Other Topics Concern   Not on file  Social History Narrative   Not on file   Social Determinants of Health   Financial Resource Strain: Not on file  Food Insecurity: Not on file  Transportation Needs: Not on file  Physical Activity: Not on file  Stress: Not on file  Social Connections: Not on file    Family History  Problem Relation Age of Onset   Thyroid disease Mother    Dementia Mother     Hypertension Father    Cancer Father    Heart attack Brother     Outpatient Encounter Medications as of 12/28/2021  Medication Sig   Accu-Chek Softclix Lancets lancets Check glucose 3x per day.   Ascorbic Acid (VITAMIN C PO) Take 1 tablet by mouth 3 (three) times a week.   blood glucose meter kit and supplies KIT Dispense based on patient and insurance preference. Use up to four times daily as directed. (FOR ICD-9 250.00, 250.01).   blood glucose meter kit and supplies KIT Dispense based on patient and insurance preference. Use up to test glucose daily.  (FOR ICD - 10)   Cholecalciferol (VITAMIN D3 PO) Take 1 tablet by mouth 3 (three) times a week.   glucose blood (ACCU-CHEK AVIVA PLUS) test strip Check glucose bid.   hydrocortisone 2.5 % ointment Apply 1 application topically 2 (two) times daily as needed (rash).   insulin detemir (LEVEMIR) 100 UNIT/ML injection Inject 0.3 mLs (30 Units total) into the skin at bedtime. Inject  25 units SQ qhs.   Insulin Pen Needle (PEN NEEDLES) 31G X 6 MM MISC Use as directed with solostar pen.   Insulin Syringe-Needle U-100 (INSULIN SYRINGE .5CC/31GX5/16") 31G X 5/16" 0.5 ML MISC Use as directed qhs.   losartan (COZAAR) 25 MG tablet Take 2 tablets po qd   metFORMIN (GLUCOPHAGE) 500 MG tablet TAKE 1 TABLET BY MOUTH 2 TIMES DAILY WITH A MEAL   [DISCONTINUED] insulin detemir (LEVEMIR) 100 UNIT/ML injection Inject 0.2 mLs (20 Units total) into the skin at bedtime. Inject 25 units SQ qhs. (Patient taking differently: Inject 20 Units into the skin at bedtime.)   [DISCONTINUED] Insulin Syringe-Needle U-100 (INSULIN SYRINGE .5CC/31GX5/16") 31G X 5/16" 0.5 ML MISC Use as directed qhs.   [DISCONTINUED] mupirocin ointment (BACTROBAN) 2 % Apply 1 application topically 2 (two) times daily. (Patient not taking: Reported on 09/10/2021)   [DISCONTINUED] triamcinolone cream (KENALOG) 0.1 % Apply 1 application topically 2 (two) times daily. (Patient not taking: Reported on  09/10/2021)   No facility-administered encounter medications on file as of 12/28/2021.    ALLERGIES: Allergies  Allergen Reactions   Lisinopril Cough   Penicillins Rash    Has patient had a PCN reaction causing immediate rash, facial/tongue/throat swelling, SOB or lightheadedness with hypotension: Yes Has patient had a PCN reaction causing severe rash involving mucus membranes or skin necrosis: No Has patient had a PCN reaction that required hospitalization No Has patient had a PCN reaction occurring within the last 10 years: No If all of the above answers are "NO", then may proceed with Cephalosporin use.      VACCINATION STATUS: Immunization History  Administered Date(s) Administered   Influenza,inj,Quad PF,6+ Mos 10/03/2017, 11/06/2018, 09/07/2019   Moderna Sars-Covid-2 Vaccination 01/27/2020, 02/21/2020, 10/31/2020   Pneumococcal Polysaccharide-23 11/06/2018    Diabetes He presents for his follow-up diabetic visit. He has type 2 diabetes mellitus. Onset time: he was diagnosed at approx age of 89 years. His disease course has been improving. There are no hypoglycemic associated symptoms. Pertinent negatives for hypoglycemia include no confusion, headaches, pallor or seizures. Associated symptoms include polydipsia and polyuria. Pertinent negatives for diabetes include no chest pain, no fatigue, no polyphagia and no weakness. There are no hypoglycemic complications. Symptoms are improving. There are no diabetic complications. Risk factors for coronary artery disease include dyslipidemia, diabetes mellitus, hypertension and male sex. Current diabetic treatment includes insulin injections. He is compliant with treatment all of the time. His weight is fluctuating minimally. He has not had a previous visit with a dietitian. He participates in exercise intermittently. His home blood glucose trend is decreasing steadily. His breakfast blood glucose range is generally 140-180 mg/dl. His bedtime  blood glucose range is generally 180-200 mg/dl. His overall blood glucose range is 180-200 mg/dl. (He presents with his logs showing blood glucose readings between 140-165 mg per DL at fasting improving from 240+ during his last visit.  His point-of-care A1c is 8% improving from 10% during his last visit.    ) An ACE inhibitor/angiotensin II receptor blocker is being taken. Eye exam is current.  Hyperlipidemia This is a chronic problem. The current episode started more than 1 year ago. The problem is uncontrolled. Exacerbating diseases include diabetes. Pertinent negatives include no chest pain, myalgias or shortness of breath. He is currently on no antihyperlipidemic treatment. Risk factors for coronary artery disease include dyslipidemia, diabetes mellitus, hypertension and male sex.  Hypertension This is a chronic problem. The current episode started more than 1 year ago. The problem  is controlled. Pertinent negatives include no chest pain, headaches, neck pain, palpitations or shortness of breath. Risk factors for coronary artery disease include dyslipidemia, diabetes mellitus and male gender. Past treatments include angiotensin blockers.    Review of Systems  Constitutional:  Negative for chills, fatigue, fever and unexpected weight change.  HENT:  Negative for dental problem, mouth sores and trouble swallowing.   Eyes:  Negative for visual disturbance.  Respiratory:  Negative for cough, choking, chest tightness, shortness of breath and wheezing.   Cardiovascular:  Negative for chest pain, palpitations and leg swelling.  Gastrointestinal:  Negative for abdominal distention, abdominal pain, constipation, diarrhea, nausea and vomiting.  Endocrine: Positive for polydipsia and polyuria. Negative for polyphagia.  Genitourinary:  Negative for dysuria, flank pain, hematuria and urgency.  Musculoskeletal:  Negative for back pain, gait problem, myalgias and neck pain.  Skin:  Negative for pallor, rash  and wound.  Neurological:  Negative for seizures, syncope, weakness, numbness and headaches.  Psychiatric/Behavioral: Negative.  Negative for confusion and dysphoric mood.    Objective:    Vitals with BMI 12/28/2021 10/14/2021 09/24/2021  Height 5' 9"  - 5' 9"   Weight 197 lbs 199 lbs 196 lbs 10 oz  BMI 29.08 80.99 83.38  Systolic 250 - 539  Diastolic 78 - 82  Pulse 76 - 88    BP 120/78    Pulse 76    Ht 5' 9"  (1.753 m)    Wt 197 lb (89.4 kg)    BMI 29.09 kg/m   Wt Readings from Last 3 Encounters:  12/28/21 197 lb (89.4 kg)  10/14/21 199 lb (90.3 kg)  09/24/21 196 lb 9.6 oz (89.2 kg)       CMP ( most recent) CMP     Component Value Date/Time   NA 138 12/10/2021 0913   K 4.8 12/10/2021 0913   CL 100 12/10/2021 0913   CO2 22 12/10/2021 0913   GLUCOSE 139 (H) 12/10/2021 0913   GLUCOSE 125 (H) 05/27/2020 1037   BUN 17 12/10/2021 0913   CREATININE 1.49 (H) 12/10/2021 0913   CREATININE 1.22 09/09/2014 0910   CALCIUM 9.9 12/10/2021 0913   PROT 7.1 12/10/2021 0913   ALBUMIN 4.3 12/10/2021 0913   AST 25 12/10/2021 0913   ALT 36 12/10/2021 0913   ALKPHOS 94 12/10/2021 0913   BILITOT 0.3 12/10/2021 0913   GFRNONAA 50 (L) 11/18/2020 0830   GFRAA 57 (L) 11/18/2020 0830     Diabetic Labs (most recent): Lab Results  Component Value Date   HGBA1C 8.0 (A) 12/28/2021   HGBA1C 10.0 (A) 09/10/2021   HGBA1C 8.8 (H) 03/30/2021     Lipid Panel ( most recent) Lipid Panel     Component Value Date/Time   CHOL 202 (H) 12/10/2021 0913   TRIG 202 (H) 12/10/2021 0913   HDL 42 12/10/2021 0913   CHOLHDL 4.8 12/10/2021 0913   CHOLHDL 3.1 09/09/2014 0910   VLDL 17 09/09/2014 0910   LDLCALC 124 (H) 12/10/2021 0913   LABVLDL 36 12/10/2021 0913      Lab Results  Component Value Date   TSH 2.440 12/10/2021   TSH 1.878 10/01/2014   FREET4 1.15 12/10/2021      Assessment & Plan:   1. Type 2 diabetes mellitus without complication, without long-term current use of insulin  (HCC)  - Philip Caldwell has currently uncontrolled symptomatic type 2 DM since  56 years of age.  He presents with his logs showing blood glucose readings between 140-165  mg per DL at fasting improving from 240+ during his last visit.  His point-of-care A1c is 8% improving from 10% during his last visit.    - Recent labs reviewed. - I had a long discussion with him about the progressive nature of diabetes and the pathology behind its complications. - He denies any gross complications of diabetes , however he  remains at a high risk for more acute and chronic complications which include CAD, CVA, CKD, retinopathy, and neuropathy. These are all discussed in detail with him.  - I have counseled him on diet  and weight management  by adopting a carbohydrate restricted/protein rich diet. Patient is encouraged to switch to  unprocessed or minimally processed     complex starch and increased protein intake (animal or plant source), fruits, and vegetables. -  he is advised to stick to a routine mealtimes to eat 3 meals  a day and avoid unnecessary snacks ( to snack only to correct hypoglycemia).   - he acknowledges that there is a room for improvement in his food and drink choices. - Suggestion is made for him to avoid simple carbohydrates  from his diet including Cakes, Sweet Desserts, Ice Cream, Soda (diet and regular), Sweet Tea, Candies, Chips, Cookies, Store Bought Juices, Alcohol , Artificial Sweeteners,  Coffee Creamer, and "Sugar-free" Products, Lemonade. This will help patient to have more stable blood glucose profile and potentially avoid unintended weight gain.  The following Lifestyle Medicine recommendations according to Stephen  [facility]) were discussed and and offered to patient and he  agrees to start the journey:  A. Whole Foods, Plant-Based Nutrition comprising of fruits and vegetables, plant-based proteins, whole-grain carbohydrates was discussed in detail with  the patient.   A list for source of those nutrients were also provided to the patient.  Patient will use only water or unsweetened tea for hydration. B.  The need to stay away from risky substances including alcohol, smoking; obtaining 7 to 9 hours of restorative sleep, at least 150 minutes of moderate intensity exercise weekly, the importance of healthy social connections,  and stress management techniques were discussed. C.  A full color page of  Calorie density of various food groups per pound showing examples of each food groups was provided to the patient.    - he will be scheduled with Jearld Fenton, RDN, CDE for diabetes education.  - I have approached him with the following individualized plan to manage  his diabetes and patient agrees:   -He has partially engaged in [place] to address his diabetes, hyperlipidemia, hypertension.   -I advised him to continue on the recommended  whole food plant predominant diet, in order to avoid further escalation in his medications. -In the meantime, he is advised to increase his Levemir to 30 units nightly, continue metformin 500 mg p.o. twice daily.   -He is advised to monitor blood glucose twice daily before breakfast and at bedtime.  - Adjustment parameters are given to him for hypo and hyperglycemia in writing. - he is encouraged to call clinic for blood glucose levels less than 70 or above 150 mg /dl.  - he will be considered for incretin therapy as appropriate next visit.  - Specific targets for  A1c;  LDL, HDL,  and Triglycerides were discussed with the patient.  2) Blood Pressure /Hypertension: His blood pressure is controlled to target.   he is advised to continue his current medications including losartan 25 mg p.o. daily with  breakfast .  3) Lipids/Hyperlipidemia: His recent lipid panel showed uncontrolled dyslipidemia LDL at 124.  Patient is not on statins.  He was approached with low-dose statins, however he hesitates and wants to  maximize his lifestyle change which is acceptable  He will be considered for fasting lipid panel before his next visit.  4)  Weight/Diet:  Body mass index is 29.09 kg/m.  -   clearly complicating his diabetes care.   he is  a candidate for weight loss. I discussed with him the fact that loss of 5 - 10% of his  current body weight will have the most impact on his diabetes management.  Exercise, and detailed carbohydrates information provided  -  detailed on discharge instructions.  5) Chronic Care/Health Maintenance:  -he  is on ACEI/ARB  medications and  is encouraged to initiate and continue to follow up with Ophthalmology, Dentist,  Podiatrist at least yearly or according to recommendations, and advised to   stay away from smoking. I have recommended yearly flu vaccine and pneumonia vaccine at least every 5 years; moderate intensity exercise for up to 150 minutes weekly; and  sleep for at least 7 hours a day.  - he is  advised to maintain close follow up with Coral Spikes, DO for primary care needs, as well as his other providers for optimal and coordinated care.   I spent 41 minutes in the care of the patient today including review of labs from [place], Lipids, Thyroid Function, Hematology (current and previous including abstractions from other facilities); face-to-face time discussing  his blood glucose readings/logs, discussing hypoglycemia and hyperglycemia episodes and symptoms, medications doses, his options of short and long term treatment based on the latest standards of care / guidelines;  discussion about incorporating lifestyle medicine;  and documenting the encounter.    Please refer to Patient Instructions for Blood Glucose Monitoring and Insulin/Medications Dosing Guide"  in media tab for additional information. Please  also refer to " Patient Self Inventory" in the Media  tab for reviewed elements of pertinent patient history.  Cathlean Marseilles participated in the discussions, expressed  understanding, and voiced agreement with the above plans.  All questions were answered to his satisfaction. he is encouraged to contact clinic should he have any questions or concerns prior to his return visit.    Follow up plan: - Return in about 4 months (around 04/27/2022) for F/U with Pre-visit Labs, Meter, Logs, A1c here.Glade Lloyd, MD [facility] [address] [place], [place] [address] Phone: [phone]  Fax: [phone]    12/28/2021, 1:08 PM  This note was partially dictated with voice recognition software. Similar sounding words can be transcribed inadequately or may not  be corrected upon review.

## 2021-12-28 NOTE — Patient Instructions (Addendum)
Goals Keep up the great job! Continue to work on more plant based foods Look up more plant based recipes Keep working out 5 times per week. Get A1C down to 7% Call MD of BS are in the 70's 2-3 times per week Drink a gallon of water per day. Get A1C down to 7%

## 2021-12-28 NOTE — Progress Notes (Signed)
Medical Nutrition Therapy Dm Follow up  Appointment Start time:  1000  Appointment End time:  1030  Preferred learning style: no preference  Learning readiness: Ready, change in progress   NUTRITION ASSESSMENT Follow up Saw Dr. Dorris Fetch today. He increased his Levermir to 30 units a day now instead of 20 units per day. Metformin still 500 mg BID. Changes he has made:Eating more vegetables-now eating broccoli, salad, cabbage, Drinking only water. Has been eating more cheese recently. Working out 5 days per week.  He notes he is frustrated his A1C isn't lower. Reviewed the fact that he had 2 holidays in the last 3 months that probably impacted his A1C. HIs BS are very good in the last 2-3 weeks. Lipid profile is improved in some areas. His wife is supportive. Wt stable.   A1C down to 8% from 10%. Creatine has improved. FBS: 117-140's the last few weeks, Bedtime: 130-160's.  No lipid medications.   Lab Results  Component Value Date   HGBA1C 8.0 (A) 12/28/2021   CMP Latest Ref Rng & Units 12/10/2021 03/30/2021 11/18/2020  Glucose 70 - 99 mg/dL 139(H) 165(H) 352(H)  BUN 6 - 24 mg/dL 17 14 16   Creatinine 0.76 - 1.27 mg/dL 1.49(H) 1.66(H) 1.54(H)  Sodium 134 - 144 mmol/L 138 140 135  Potassium 3.5 - 5.2 mmol/L 4.8 5.0 5.0  Chloride 96 - 106 mmol/L 100 102 95(L)  CO2 20 - 29 mmol/L 22 21 24   Calcium 8.7 - 10.2 mg/dL 9.9 10.0 9.3  Total Protein 6.0 - 8.5 g/dL 7.1 7.3 7.2  Total Bilirubin 0.0 - 1.2 mg/dL 0.3 0.3 0.3  Alkaline Phos 44 - 121 IU/L 94 91 113  AST 0 - 40 IU/L 25 29 26   ALT 0 - 44 IU/L 36 58(H) 43   Lipid Panel     Component Value Date/Time   CHOL 202 (H) 12/10/2021 0913   TRIG 202 (H) 12/10/2021 0913   HDL 42 12/10/2021 0913   CHOLHDL 4.8 12/10/2021 0913   CHOLHDL 3.1 09/09/2014 0910   VLDL 17 09/09/2014 0910   LDLCALC 124 (H) 12/10/2021 0913   LABVLDL 36 12/10/2021 0913   Anthropometrics  Wt Readings from Last 3 Encounters:  12/28/21 197 lb (89.4 kg)  10/14/21 199  lb (90.3 kg)  09/24/21 196 lb 9.6 oz (89.2 kg)   Ht Readings from Last 3 Encounters:  12/28/21 5\' 9"  (1.753 m)  09/24/21 5\' 9"  (1.753 m)  09/10/21 5\' 9"  (1.753 m)   There is no height or weight on file to calculate BMI. @BMIFA @ Facility age limit for growth percentiles is 20 years. Facility age limit for growth percentiles is 20 years.    Clinical Medical Hx: See chart Medications: Levermeir 30 units a day. Metformin 500 mg BID. Labs: FBS:   Lab Results  Component Value Date   HGBA1C 8.0 (A) 12/28/2021   CMP Latest Ref Rng & Units 12/10/2021 03/30/2021 11/18/2020  Glucose 70 - 99 mg/dL 139(H) 165(H) 352(H)  BUN 6 - 24 mg/dL 17 14 16   Creatinine 0.76 - 1.27 mg/dL 1.49(H) 1.66(H) 1.54(H)  Sodium 134 - 144 mmol/L 138 140 135  Potassium 3.5 - 5.2 mmol/L 4.8 5.0 5.0  Chloride 96 - 106 mmol/L 100 102 95(L)  CO2 20 - 29 mmol/L 22 21 24   Calcium 8.7 - 10.2 mg/dL 9.9 10.0 9.3  Total Protein 6.0 - 8.5 g/dL 7.1 7.3 7.2  Total Bilirubin 0.0 - 1.2 mg/dL 0.3 0.3 0.3  Alkaline Phos 44 - 121  IU/L 94 91 113  AST 0 - 40 IU/L 25 29 26   ALT 0 - 44 IU/L 36 58(H) 43   Lipid Panel     Component Value Date/Time   CHOL 202 (H) 12/10/2021 0913   TRIG 202 (H) 12/10/2021 0913   HDL 42 12/10/2021 0913   CHOLHDL 4.8 12/10/2021 0913   CHOLHDL 3.1 09/09/2014 0910   VLDL 17 09/09/2014 0910   LDLCALC 124 (H) 12/10/2021 0913   LABVLDL 36 12/10/2021 0913    Notable Signs/Symptoms: None  Lifestyle & Dietary Hx LIves with his wife.   Estimated daily fluid intake: 48-60 oz Supplements: none Sleep: 8 Stress / self-care: none Current average weekly physical activity: none  24-Hr Dietary Recall First Meal: 1/2 oatmeal, apple and banana 1/2, water Snack:  Second Meal: Fish-fried, slaw, water Snack: pineapple Third Meal: Salmon and cabbage, water Snack:  Beverages: water   Estimated Energy Needs Calories: 1800  Carbohydrate: 200g Protein: 136g Fat: 50g   NUTRITION DIAGNOSIS  NB-1.1  Food and nutrition-related knowledge deficit As related to Diabetes Type 2.  As evidenced by A1C 10%.   NUTRITION INTERVENTION  Nutrition education (E-1) on the following topics:  Nutrition and Diabetes education provided on My Plate, CHO counting, meal planning, portion sizes, timing of meals, avoiding snacks between meals unless having a low blood sugar, target ranges for A1C and blood sugars, signs/symptoms and treatment of hyper/hypoglycemia, monitoring blood sugars, taking medications as prescribed, benefits of exercising 30 minutes per day and prevention of complications of DM.   Handouts Provided Include  My Plant based foods Meal Plan Card  Learning Style & Readiness for Change Teaching method utilized: Visual & Auditory  Demonstrated degree of understanding via: Teach Back  Barriers to learning/adherence to lifestyle change: none  Goals Established by Pt  Keep up the great job! Continue to work on more plant based foods Look up more plant based recipes Keep working out 5 times per week. Get A1C down to 7% Call MD of BS are in the 70's 2-3 times per week Drink a gallon of water per day. Get A1C down to 7%   MONITORING & EVALUATION Dietary intake, weekly physical activity, and blood sugars in 6 months.  Next Steps  Patient is to work on more plant based foods.Marland Kitchen

## 2021-12-28 NOTE — Telephone Encounter (Signed)
Pt was in today and is needing a refill Insulin Syringe-Needle U-100 (INSULIN SYRINGE .5CC/31GX5/16") 31G X 5/16" 0.5 ML MISC  [id number] - [place], [place] [address] [facility] Phone:  [phone]  Fax:  [phone]

## 2021-12-28 NOTE — Telephone Encounter (Signed)
Rx sent 

## 2021-12-28 NOTE — Patient Instructions (Signed)

## 2022-01-03 ENCOUNTER — Other Ambulatory Visit: Payer: Self-pay

## 2022-01-03 ENCOUNTER — Emergency Department (HOSPITAL_COMMUNITY): Payer: 59

## 2022-01-03 ENCOUNTER — Inpatient Hospital Stay (HOSPITAL_COMMUNITY)
Admission: EM | Admit: 2022-01-03 | Discharge: 2022-01-05 | DRG: 313 | Disposition: A | Discharge status: Home / Self Care | Payer: 59 | Attending: Student | Admitting: Student

## 2022-01-03 ENCOUNTER — Encounter (HOSPITAL_COMMUNITY): Payer: Self-pay | Admitting: Emergency Medicine

## 2022-01-03 DIAGNOSIS — E860 Dehydration: Secondary | ICD-10-CM | POA: Diagnosis present

## 2022-01-03 DIAGNOSIS — R112 Nausea with vomiting, unspecified: Secondary | ICD-10-CM | POA: Diagnosis not present

## 2022-01-03 DIAGNOSIS — Z794 Long term (current) use of insulin: Secondary | ICD-10-CM

## 2022-01-03 DIAGNOSIS — N1831 Chronic kidney disease, stage 3a: Secondary | ICD-10-CM | POA: Diagnosis present

## 2022-01-03 DIAGNOSIS — R6510 Systemic inflammatory response syndrome (SIRS) of non-infectious origin without acute organ dysfunction: Secondary | ICD-10-CM | POA: Diagnosis present

## 2022-01-03 DIAGNOSIS — E119 Type 2 diabetes mellitus without complications: Secondary | ICD-10-CM | POA: Diagnosis not present

## 2022-01-03 DIAGNOSIS — A419 Sepsis, unspecified organism: Secondary | ICD-10-CM

## 2022-01-03 DIAGNOSIS — E778 Other disorders of glycoprotein metabolism: Secondary | ICD-10-CM | POA: Diagnosis present

## 2022-01-03 DIAGNOSIS — E872 Acidosis, unspecified: Secondary | ICD-10-CM | POA: Diagnosis present

## 2022-01-03 DIAGNOSIS — E8809 Other disorders of plasma-protein metabolism, not elsewhere classified: Secondary | ICD-10-CM | POA: Insufficient documentation

## 2022-01-03 DIAGNOSIS — Z7984 Long term (current) use of oral hypoglycemic drugs: Secondary | ICD-10-CM

## 2022-01-03 DIAGNOSIS — Z20822 Contact with and (suspected) exposure to covid-19: Secondary | ICD-10-CM | POA: Diagnosis present

## 2022-01-03 DIAGNOSIS — R079 Chest pain, unspecified: Principal | ICD-10-CM | POA: Diagnosis present

## 2022-01-03 DIAGNOSIS — E1122 Type 2 diabetes mellitus with diabetic chronic kidney disease: Secondary | ICD-10-CM

## 2022-01-03 DIAGNOSIS — D72825 Bandemia: Secondary | ICD-10-CM | POA: Diagnosis not present

## 2022-01-03 DIAGNOSIS — Z79899 Other long term (current) drug therapy: Secondary | ICD-10-CM | POA: Diagnosis not present

## 2022-01-03 DIAGNOSIS — E871 Hypo-osmolality and hyponatremia: Secondary | ICD-10-CM | POA: Diagnosis present

## 2022-01-03 DIAGNOSIS — Z880 Allergy status to penicillin: Secondary | ICD-10-CM | POA: Diagnosis not present

## 2022-01-03 DIAGNOSIS — E785 Hyperlipidemia, unspecified: Secondary | ICD-10-CM | POA: Diagnosis present

## 2022-01-03 DIAGNOSIS — Z888 Allergy status to other drugs, medicaments and biological substances status: Secondary | ICD-10-CM

## 2022-01-03 DIAGNOSIS — I129 Hypertensive chronic kidney disease with stage 1 through stage 4 chronic kidney disease, or unspecified chronic kidney disease: Secondary | ICD-10-CM | POA: Diagnosis present

## 2022-01-03 DIAGNOSIS — I10 Essential (primary) hypertension: Secondary | ICD-10-CM

## 2022-01-03 DIAGNOSIS — N183 Chronic kidney disease, stage 3 unspecified: Secondary | ICD-10-CM

## 2022-01-03 LAB — RESP PANEL BY RT-PCR (FLU A&B, COVID) ARPGX2
Influenza A by PCR: NEGATIVE
Influenza B by PCR: NEGATIVE
SARS Coronavirus 2 by RT PCR: NEGATIVE

## 2022-01-03 LAB — CBC
HCT: 50.9 % (ref 39.0–52.0)
Hemoglobin: 18.4 g/dL — ABNORMAL HIGH (ref 13.0–17.0)
MCH: 29 pg (ref 26.0–34.0)
MCHC: 36.1 g/dL — ABNORMAL HIGH (ref 30.0–36.0)
MCV: 80.2 fL (ref 80.0–100.0)
Platelets: 372 10*3/uL (ref 150–400)
RBC: 6.35 MIL/uL — ABNORMAL HIGH (ref 4.22–5.81)
RDW: 14.6 % (ref 11.5–15.5)
WBC: 18.1 10*3/uL — ABNORMAL HIGH (ref 4.0–10.5)
nRBC: 0 % (ref 0.0–0.2)

## 2022-01-03 LAB — DIFFERENTIAL
Abs Immature Granulocytes: 0.09 10*3/uL — ABNORMAL HIGH (ref 0.00–0.07)
Basophils Absolute: 0.1 10*3/uL (ref 0.0–0.1)
Basophils Relative: 0 %
Eosinophils Absolute: 0 10*3/uL (ref 0.0–0.5)
Eosinophils Relative: 0 %
Immature Granulocytes: 1 %
Lymphocytes Relative: 3 %
Lymphs Abs: 0.6 10*3/uL — ABNORMAL LOW (ref 0.7–4.0)
Monocytes Absolute: 0.9 10*3/uL (ref 0.1–1.0)
Monocytes Relative: 5 %
Neutro Abs: 16.4 10*3/uL — ABNORMAL HIGH (ref 1.7–7.7)
Neutrophils Relative %: 91 %

## 2022-01-03 LAB — URINALYSIS, ROUTINE W REFLEX MICROSCOPIC
Bilirubin Urine: NEGATIVE
Glucose, UA: NEGATIVE mg/dL
Hgb urine dipstick: NEGATIVE
Ketones, ur: NEGATIVE mg/dL
Leukocytes,Ua: NEGATIVE
Nitrite: NEGATIVE
Protein, ur: NEGATIVE mg/dL
Specific Gravity, Urine: 1.01 (ref 1.005–1.030)
pH: 5.5 (ref 5.0–8.0)

## 2022-01-03 LAB — RAPID URINE DRUG SCREEN, HOSP PERFORMED
Amphetamines: NOT DETECTED
Barbiturates: NOT DETECTED
Benzodiazepines: NOT DETECTED
Cocaine: NOT DETECTED
Opiates: POSITIVE — AB
Tetrahydrocannabinol: NOT DETECTED

## 2022-01-03 LAB — BLOOD GAS, VENOUS
Acid-base deficit: 0.9 mmol/L (ref 0.0–2.0)
Bicarbonate: 21.9 mmol/L (ref 20.0–28.0)
Drawn by: 6892
FIO2: 21
O2 Saturation: 39.1 %
Patient temperature: 36.4
pCO2, Ven: 41.2 mmHg — ABNORMAL LOW (ref 44.0–60.0)
pH, Ven: 7.375 (ref 7.250–7.430)
pO2, Ven: <31 mmHg — CL (ref 32.0–45.0)

## 2022-01-03 LAB — HEPATIC FUNCTION PANEL
ALT: 41 U/L (ref 0–44)
AST: 30 U/L (ref 15–41)
Albumin: 4.8 g/dL (ref 3.5–5.0)
Alkaline Phosphatase: 90 U/L (ref 38–126)
Bilirubin, Direct: <0.1 mg/dL (ref 0.0–0.2)
Total Bilirubin: 0.5 mg/dL (ref 0.3–1.2)
Total Protein: 9 g/dL — ABNORMAL HIGH (ref 6.5–8.1)

## 2022-01-03 LAB — BASIC METABOLIC PANEL
Anion gap: 10 (ref 5–15)
BUN: 20 mg/dL (ref 6–20)
CO2: 22 mmol/L (ref 22–32)
Calcium: 9.6 mg/dL (ref 8.9–10.3)
Chloride: 101 mmol/L (ref 98–111)
Creatinine, Ser: 1.59 mg/dL — ABNORMAL HIGH (ref 0.61–1.24)
GFR, Estimated: 50 mL/min — ABNORMAL LOW (ref >60)
Glucose, Bld: 184 mg/dL — ABNORMAL HIGH (ref 70–99)
Potassium: 4.8 mmol/L (ref 3.5–5.1)
Sodium: 133 mmol/L — ABNORMAL LOW (ref 135–145)

## 2022-01-03 LAB — APTT: aPTT: 25 s (ref 24–36)

## 2022-01-03 LAB — LACTIC ACID, PLASMA
Lactic Acid, Venous: 2.7 mmol/L (ref 0.5–1.9)
Lactic Acid, Venous: 2.3 mmol/L (ref 0.5–1.9)

## 2022-01-03 LAB — GLUCOSE, CAPILLARY: Glucose-Capillary: 189 mg/dL — ABNORMAL HIGH (ref 70–99)

## 2022-01-03 LAB — PROTIME-INR
INR: 0.9 (ref 0.8–1.2)
Prothrombin Time: 12.1 s (ref 11.4–15.2)

## 2022-01-03 LAB — CBG MONITORING, ED: Glucose-Capillary: 125 mg/dL — ABNORMAL HIGH (ref 70–99)

## 2022-01-03 LAB — PROCALCITONIN: Procalcitonin: 0.31 ng/mL

## 2022-01-03 LAB — TROPONIN I (HIGH SENSITIVITY)
Troponin I (High Sensitivity): 3 ng/L (ref <18)
Troponin I (High Sensitivity): 3 ng/L (ref <18)

## 2022-01-03 LAB — BRAIN NATRIURETIC PEPTIDE: B Natriuretic Peptide: 50 pg/mL (ref 0.0–100.0)

## 2022-01-03 MED ORDER — POLYETHYLENE GLYCOL 3350 17 G PO PACK: 17.0000 g | PACK | Freq: Every day | ORAL | Status: DC | PRN

## 2022-01-03 MED ORDER — ONDANSETRON HCL 4 MG PO TABS: 4.0000 mg | ORAL_TABLET | Freq: Four times a day (QID) | ORAL | Status: DC | PRN

## 2022-01-03 MED ORDER — MORPHINE SULFATE (PF) 4 MG/ML IV SOLN
4.0000 mg | Freq: Once | INTRAVENOUS | Status: AC
  Administered 2022-01-03: 4 mg via INTRAVENOUS
  Filled 2022-01-03: qty 1

## 2022-01-03 MED ORDER — LACTATED RINGERS IV BOLUS (SEPSIS)
1000.0000 mL | Freq: Once | INTRAVENOUS | Status: AC
  Administered 2022-01-03: 1000 mL via INTRAVENOUS

## 2022-01-03 MED ORDER — METRONIDAZOLE 500 MG/100ML IV SOLN
500.0000 mg | Freq: Two times a day (BID) | INTRAVENOUS | Status: DC
  Administered 2022-01-03 – 2022-01-05 (×4): 500 mg via INTRAVENOUS
  Filled 2022-01-03 (×4): qty 100

## 2022-01-03 MED ORDER — VANCOMYCIN HCL 1500 MG/300ML IV SOLN
1500.0000 mg | INTRAVENOUS | Status: DC
  Administered 2022-01-04: 1500 mg via INTRAVENOUS
  Filled 2022-01-03: qty 300

## 2022-01-03 MED ORDER — LACTATED RINGERS IV SOLN: INTRAVENOUS | Status: DC

## 2022-01-03 MED ORDER — ENOXAPARIN SODIUM 40 MG/0.4ML IJ SOSY
40.0000 mg | PREFILLED_SYRINGE | INTRAMUSCULAR | Status: DC
  Administered 2022-01-03 – 2022-01-04 (×2): 40 mg via SUBCUTANEOUS
  Filled 2022-01-03 (×2): qty 0.4

## 2022-01-03 MED ORDER — LOSARTAN POTASSIUM 50 MG PO TABS
50.0000 mg | ORAL_TABLET | Freq: Every day | ORAL | Status: DC
  Administered 2022-01-04 – 2022-01-05 (×2): 50 mg via ORAL
  Filled 2022-01-03 (×2): qty 1

## 2022-01-03 MED ORDER — SODIUM CHLORIDE 0.9 % IV SOLN
2.0000 g | Freq: Two times a day (BID) | INTRAVENOUS | Status: DC
  Administered 2022-01-03 – 2022-01-04 (×2): 2 g via INTRAVENOUS
  Filled 2022-01-03 (×2): qty 2

## 2022-01-03 MED ORDER — ACETAMINOPHEN 650 MG RE SUPP: 650.0000 mg | Freq: Four times a day (QID) | RECTAL | Status: DC | PRN

## 2022-01-03 MED ORDER — ONDANSETRON HCL 4 MG/2ML IJ SOLN: 4.0000 mg | Freq: Four times a day (QID) | INTRAMUSCULAR | Status: DC | PRN

## 2022-01-03 MED ORDER — ONDANSETRON HCL 4 MG/2ML IJ SOLN
4.0000 mg | Freq: Once | INTRAMUSCULAR | Status: AC
  Administered 2022-01-03: 4 mg via INTRAVENOUS
  Filled 2022-01-03: qty 2

## 2022-01-03 MED ORDER — ALUM & MAG HYDROXIDE-SIMETH 200-200-20 MG/5ML PO SUSP
30.0000 mL | Freq: Once | ORAL | Status: AC
  Administered 2022-01-03: 30 mL via ORAL
  Filled 2022-01-03: qty 30

## 2022-01-03 MED ORDER — SODIUM CHLORIDE 0.9 % IV BOLUS
1000.0000 mL | Freq: Once | INTRAVENOUS | Status: AC
  Administered 2022-01-03: 1000 mL via INTRAVENOUS

## 2022-01-03 MED ORDER — SODIUM CHLORIDE 0.9 % IV SOLN: INTRAVENOUS | Status: AC

## 2022-01-03 MED ORDER — INSULIN ASPART 100 UNIT/ML IJ SOLN: 0.0000 [IU] | Freq: Every day | INTRAMUSCULAR | Status: DC

## 2022-01-03 MED ORDER — ASPIRIN 325 MG PO TABS
325.0000 mg | ORAL_TABLET | Freq: Every day | ORAL | Status: DC
  Administered 2022-01-03 – 2022-01-04 (×2): 325 mg via ORAL
  Filled 2022-01-03 (×3): qty 1

## 2022-01-03 MED ORDER — FENTANYL CITRATE PF 50 MCG/ML IJ SOSY
50.0000 ug | PREFILLED_SYRINGE | Freq: Once | INTRAMUSCULAR | Status: AC
  Administered 2022-01-03: 50 ug via INTRAVENOUS
  Filled 2022-01-03: qty 1

## 2022-01-03 MED ORDER — INSULIN DETEMIR 100 UNIT/ML ~~LOC~~ SOLN
15.0000 [IU] | Freq: Every day | SUBCUTANEOUS | Status: DC
  Administered 2022-01-03 – 2022-01-04 (×2): 15 [IU] via SUBCUTANEOUS
  Filled 2022-01-03 (×3): qty 0.15

## 2022-01-03 MED ORDER — DIAZEPAM 5 MG/ML IJ SOLN
5.0000 mg | Freq: Once | INTRAMUSCULAR | Status: AC
  Administered 2022-01-03: 5 mg via INTRAVENOUS
  Filled 2022-01-03: qty 2

## 2022-01-03 MED ORDER — VANCOMYCIN HCL 1750 MG/350ML IV SOLN
1750.0000 mg | Freq: Once | INTRAVENOUS | Status: AC
  Administered 2022-01-03: 1750 mg via INTRAVENOUS
  Filled 2022-01-03: qty 350

## 2022-01-03 MED ORDER — INSULIN ASPART 100 UNIT/ML IJ SOLN
0.0000 [IU] | Freq: Three times a day (TID) | INTRAMUSCULAR | Status: DC
  Administered 2022-01-04: 2 [IU] via SUBCUTANEOUS
  Administered 2022-01-04 – 2022-01-05 (×3): 1 [IU] via SUBCUTANEOUS

## 2022-01-03 MED ORDER — ACETAMINOPHEN 325 MG PO TABS: 650.0000 mg | ORAL_TABLET | Freq: Four times a day (QID) | ORAL | Status: DC | PRN

## 2022-01-03 MED ORDER — IOHEXOL 350 MG/ML SOLN
75.0000 mL | Freq: Once | INTRAVENOUS | Status: AC | PRN
  Administered 2022-01-03: 75 mL via INTRAVENOUS

## 2022-01-03 NOTE — ED Triage Notes (Signed)
Pt to the ED with chest pain N/V. and shortness of breath that began at 1200 described as sharp.

## 2022-01-03 NOTE — ED Notes (Signed)
Report given to [facility]. Patient is ready for transport.

## 2022-01-03 NOTE — H&P (Addendum)
History and Physical    Philip Caldwell:[id number] DOB: Jul 04, 1966 DOA: 01/03/2022  PCP: Coral Spikes, DO   Patient coming from: Home  I have personally briefly reviewed patient's old medical records in [place]  Chief Complaint: chest and abdominal pain  HPI: Philip Caldwell is a 56 y.o. male with medical history significant for atrial fibrillation, hypertension, diabetes mellitus. Patient presented to the ED with complaints of chest pain, nausea vomiting and abdominal cramping started this morning.  Described chest pain as left-sided, with associated difficulty breathing, nonradiating, described as sharp.  He had vomited, and was resting when chest pain started.  Does not smoke cigarettes, denies IV drug use.  Denies prior episodes.  No leg swelling.  No known family history of heart problems.  About 10 years ago he had a transient episode of atrial fibrillation, he did not require anticoagulation for this. Reports 1 episode of loose stools, multiple episodes of vomiting today.  Denies dysuria.  ED Course: Temperature 97.5.  Tachycardic, heart rates 107-129, respiratory rate 15-20.  O2 sats 90 to 97% on room air. WBC 18.1.  Troponin 3.  COVID and influenza test negative.  BNP 50.  Lactic acidosis of 2.7 > 2.3.  Chest x-ray negative for acute abnormality.  UA not suggestive of infection.  Blood and urine cultures obtained.  2 L bolus given.  Hospitalist to see.  Review of Systems: As per HPI all other systems reviewed and negative.  Past Medical History:  Diagnosis Date   A-fib (HCC)    Arm pain, central    with intermittent numbness   Diabetes mellitus without complication ([place])    Hypertension    Neck pain    Renal disorder    Tendonitis     Past Surgical History:  Procedure Laterality Date   COLONOSCOPY N/A 10/08/2015   Procedure: COLONOSCOPY;  Surgeon: Rogene Houston, MD;  Location: [facility];  Service: Endoscopy;  Laterality: N/A;  [place] Left 05/25/2020   Procedure: CYSTOSCOPY WITH STENT PLACEMENT retrograde pylegram;  Surgeon: Lucas Mallow, MD;  Location: [facility];  Service: Urology;  Laterality: Left;   NERVE SURGERY       reports that he has never smoked. He has never used smokeless tobacco. He reports that he does not drink alcohol and does not use drugs.  Allergies  Allergen Reactions   Lisinopril Cough   Penicillins Rash    Has patient had a PCN reaction causing immediate rash, facial/tongue/throat swelling, SOB or lightheadedness with hypotension: Yes Has patient had a PCN reaction causing severe rash involving mucus membranes or skin necrosis: No Has patient had a PCN reaction that required hospitalization No Has patient had a PCN reaction occurring within the last 10 years: No If all of the above answers are "NO", then may proceed with Cephalosporin use.      Family History  Problem Relation Age of Onset   Thyroid disease Mother    Dementia Mother    Hypertension Father    Cancer Father    Heart attack Brother     Prior to Admission medications   Medication Sig Start Date End Date Taking? Authorizing Provider  Accu-Chek Softclix Lancets lancets Check glucose 3x per day. 11/18/20  Yes Lovena Le, Malena M, DO  Ascorbic Acid (VITAMIN C PO) Take 1 tablet by mouth 3 (three) times a week.   Yes [provider]  blood glucose meter kit and supplies  KIT Dispense based on patient and insurance preference. Use up to four times daily as directed. (FOR ICD-9 250.00, 250.01). 07/22/18  Yes Kathie Dike, MD  blood glucose meter kit and supplies KIT Dispense based on patient and insurance preference. Use up to test glucose daily.  (FOR ICD - 10) 11/27/18  Yes [name], MD  Cholecalciferol (VITAMIN D3 PO) Take 1 tablet by mouth 3 (three) times a week.   Yes [provider]  glucose blood (ACCU-CHEK AVIVA PLUS) test strip Check glucose bid. 04/01/21  Yes Lovena Le, Malena M, DO  insulin  detemir (LEVEMIR) 100 UNIT/ML injection Inject 0.3 mLs (30 Units total) into the skin at bedtime. Inject 25 units SQ qhs. Patient taking differently: Inject 30 Units into the skin at bedtime. 12/28/21  Yes Nida, Marella Chimes, MD  Insulin Pen Needle (PEN NEEDLES) 31G X 6 MM MISC Use as directed with solostar pen. 11/17/20  Yes Lovena Le, Malena M, DO  Insulin Syringe-Needle U-100 (INSULIN SYRINGE .5CC/31GX5/16") 31G X 5/16" 0.5 ML MISC Use as directed qhs. 12/28/21  Yes Nida, Marella Chimes, MD  losartan (COZAAR) 25 MG tablet Take 2 tablets po qd Patient taking differently: 50 mg daily. 04/01/21  Yes Taylor, Malena M, DO  metFORMIN (GLUCOPHAGE) 500 MG tablet TAKE 1 TABLET BY MOUTH 2 TIMES DAILY WITH A MEAL 12/02/21  Yes Nida, Marella Chimes, MD  hydrocortisone 2.5 % ointment Apply 1 application topically 2 (two) times daily as needed (rash). Patient not taking: Reported on 01/03/2022 10/08/21   Kathyrn Drown, MD    Physical Exam: Vitals:   01/03/22 1415 01/03/22 1430 01/03/22 1445 01/03/22 1700  BP:  96/85  108/62  Pulse: (!) 123 (!) 122 (!) 109 99  Resp: 20 19 15 15   Temp:      TempSrc:      SpO2: 92% 90% 92% 94%  Weight:      Height:        Constitutional: NAD, calm, comfortable Vitals:   01/03/22 1415 01/03/22 1430 01/03/22 1445 01/03/22 1700  BP:  96/85  108/62  Pulse: (!) 123 (!) 122 (!) 109 99  Resp: 20 19 15 15   Temp:      TempSrc:      SpO2: 92% 90% 92% 94%  Weight:      Height:       Eyes: PERRL, lids and conjunctivae normal ENMT: Mucous membranes are moist.   Neck: normal, supple, no masses, no thyromegaly Respiratory: clear to auscultation bilaterally, no wheezing, no crackles. Normal respiratory effort. No accessory muscle use.  Cardiovascular: Tachycardic, regular rate and rhythm, no murmurs / rubs / gallops. No extremity edema.  Lower extremities warm. Abdomen: no tenderness, no masses palpated. No hepatosplenomegaly. Bowel sounds positive.  Musculoskeletal: no  clubbing / cyanosis. No joint deformity upper and lower extremities. Good ROM, no contractures. Normal muscle tone.  Skin: no rashes, lesions, ulcers. No induration Neurologic: No apparent cranial nerve abnormality, moving all extremities spontaneously. Psychiatric: Normal judgment and insight. Alert and oriented x 3. Normal mood.   Labs on Admission: I have personally reviewed following labs and imaging studies  CBC: Recent Labs  Lab 01/03/22 1222  WBC 18.1*  NEUTROABS 16.4*  HGB 18.4*  HCT 50.9  MCV 80.2  PLT 876   Basic Metabolic Panel: Recent Labs  Lab 01/03/22 1222  NA 133*  K 4.8  CL 101  CO2 22  GLUCOSE 184*  BUN 20  CREATININE 1.59*  CALCIUM 9.6   GFR: Estimated  Creatinine Clearance: 56.7 mL/min (A) (by C-G formula based on SCr of 1.59 mg/dL (H)). Liver Function Tests: Recent Labs  Lab 01/03/22 1222  AST 30  ALT 41  ALKPHOS 90  BILITOT 0.5  PROT 9.0*  ALBUMIN 4.8   Coagulation Profile: Recent Labs  Lab 01/03/22 1431  INR 0.9   Urine analysis:    Component Value Date/Time   COLORURINE YELLOW 01/03/2022 [place] 01/03/2022 1550   LABSPEC 1.010 01/03/2022 1550   PHURINE 5.5 01/03/2022 1550   GLUCOSEU NEGATIVE 01/03/2022 1550   HGBUR NEGATIVE 01/03/2022 1550   BILIRUBINUR NEGATIVE 01/03/2022 1550   BILIRUBINUR + 12/19/2019 1106   [place] 01/03/2022 1550   PROTEINUR NEGATIVE 01/03/2022 1550   NITRITE NEGATIVE 01/03/2022 1550   LEUKOCYTESUR NEGATIVE 01/03/2022 1550    Radiological Exams on Admission: CT ABDOMEN PELVIS WO CONTRAST  Result Date: 01/03/2022 CLINICAL DATA:  Nausea, vomiting, chest pain EXAM: CT ABDOMEN AND PELVIS WITHOUT CONTRAST TECHNIQUE: Multidetector CT imaging of the abdomen and pelvis was performed following the standard protocol without IV contrast. RADIATION DOSE REDUCTION: This exam was performed according to the departmental dose-optimization program which includes automated exposure control,  adjustment of the mA and/or kV according to patient size and/or use of iterative reconstruction technique. COMPARISON:  05/25/2020 FINDINGS: Lower chest: No acute abnormality. Hepatobiliary: No focal liver abnormality is seen. Low-attenuation of the liver as can be seen with hepatic steatosis. No gallstones, gallbladder wall thickening, or biliary dilatation. Pancreas: Unremarkable. No pancreatic ductal dilatation or surrounding inflammatory changes. Spleen: Normal in size without focal abnormality. Adrenals/Urinary Tract: Adrenal glands are unremarkable. 13.3 x 13 cm right inferior pole hypodense, fluid attenuating renal mass with small areas of mural calcification likely reflecting a mildly complicated cyst. Multiple other smaller right renal cysts. Multiple small hypodense fluid attenuating left renal masses consistent with cysts unchanged in the prior examination with the largest measuring 17 mm. No obstructive uropathy. Decompressed bladder with contrast filling the bladder. Stomach/Bowel: Stomach is within normal limits. No evidence of bowel wall thickening, distention, or inflammatory changes. Appendix is normal. Diverticulosis without evidence of diverticulitis. Vascular/Lymphatic: No significant vascular findings are present. No enlarged abdominal or pelvic lymph nodes. Reproductive: Prostate is unremarkable. Other: No abdominal wall hernia or abnormality. No abdominopelvic ascites. Musculoskeletal: No acute osseous abnormality. No aggressive osseous lesion. IMPRESSION: 1. No acute abdominal or pelvic pathology. 2. Hepatic steatosis. 3. Multiple bilateral renal cysts with the largest measuring 13.3 x 13 cm arising from the inferior pole of the right kidney. No significant interval change compared with 05/25/2020. Electronically Signed   By: Kathreen Devoid M.D.   On: 01/03/2022 15:31   DG Chest 2 View  Result Date: 01/03/2022 CLINICAL DATA:  Chest pain EXAM: CHEST - 2 VIEW COMPARISON:  Chest x-ray dated  06/10/2012 FINDINGS: Heart size and mediastinal contours are within normal limits. Lungs are clear. No pleural effusion or pneumothorax is seen. Osseous structures about the chest are unremarkable. IMPRESSION: No active cardiopulmonary disease. No evidence of pneumonia or pulmonary edema. Electronically Signed   By: Franki Cabot M.D.   On: 01/03/2022 12:48   CT Angio Chest PE W/Cm &/Or Wo Cm  Result Date: 01/03/2022 CLINICAL DATA:  Chest pain with nausea vomiting. Shortness of breath. EXAM: CT ANGIOGRAPHY CHEST WITH CONTRAST TECHNIQUE: Multidetector CT imaging of the chest was performed using the standard protocol during bolus administration of intravenous contrast. Multiplanar CT image reconstructions and MIPs were obtained to evaluate the vascular anatomy. RADIATION DOSE  REDUCTION: This exam was performed according to the departmental dose-optimization program which includes automated exposure control, adjustment of the mA and/or kV according to patient size and/or use of iterative reconstruction technique. CONTRAST:  4m OMNIPAQUE IOHEXOL 350 MG/ML SOLN COMPARISON:  None. FINDINGS: Cardiovascular: The heart size is normal. No substantial pericardial effusion. No thoracic aortic aneurysm. Bovine arch vessel anatomy noted, normal variant. There is no filling defect within the opacified pulmonary arteries to suggest the presence of an acute pulmonary embolus. Mediastinum/Nodes: No mediastinal lymphadenopathy. There is no hilar lymphadenopathy. The esophagus has normal imaging features. There is no axillary lymphadenopathy. Lungs/Pleura: 4 mm anterior right upper lobe nodule seen on 64/6. Adjacent perifissural nodules in the right lower lobe on 71/6 measure up to 5 mm. 4 mm subpleural nodule noted left lower lobe on 88/6. Several additional scattered tiny pulmonary nodules identified measuring 4 mm or less in size. No focal airspace consolidation. No pleural effusion. Upper Abdomen: The liver shows diffusely  decreased attenuation suggesting fat deposition. 9 mm hypodensity in the dome of the liver is too small to characterize but likely benign. Bilateral renal cysts evident with large exophytic interpolar right renal lesion incompletely visualized but compatible with the large cyst seen on abdomen CT 05/25/2020. Musculoskeletal: No worrisome lytic or sclerotic osseous abnormality. Review of the MIP images confirms the above findings. IMPRESSION: 1. No CT evidence for acute pulmonary embolus. 2. Scattered tiny bilateral pulmonary nodules measuring up to 5 mm in size. These are likely benign. No follow-up needed if patient is low-risk (and has no known or suspected primary neoplasm). Non-contrast chest CT can be considered in 12 months if patient is high-risk. This recommendation follows the consensus statement: Guidelines for Management of Incidental Pulmonary Nodules Detected on CT Images: From the Fleischner Society 2017; Radiology 2017; 284:228-243. 3. Hepatic steatosis. 4. Bilateral renal cysts with large exophytic interpolar right renal lesion incompletely visualized but compatible with the large cyst seen on abdomen CT 05/25/2020. Electronically Signed   By: EMisty StanleyM.D.   On: 01/03/2022 13:27    EKG: Independently reviewed.  Sinus tachycardia rate 123.  QTc 418.  No significant ST or T wave changes from prior.  Assessment/Plan Principal Problem:   SIRS (systemic inflammatory response syndrome) (HCC) Active Problems:   Essential hypertension, benign   Type 2 diabetes mellitus without complication, without long-term current use of insulin ([place]   SIRS- meeting criteria with tachycardia 107-123, leukocytosis of 18.1.  Lactic acidosis 2.7 > 2.3.  Afebrile at this time.  Presentation concerning for infectious etiology, undetermined source .  UA- Not suggestive of UTI.  Saw his dentist 10/2021 for routine cleaning.  Denies IV drug use.  UDS positive for opiates, but fentanyl and morphine had already  been given in ED.  -CTA chest shows tiny bilateral pulmonary nodules likely benign.  Follow-up as outpatient -CT abdomen and pelvis also without acute abnormality. -Possible bacteremia, viral etiology- but COVID and influenza test are negative -Check procalcitonin - Will prophylactically start broad-spectrum antibiotics with IV Vanco cefepime and metronidazole  -Follow-up blood and urine cultures - 2 L bolus given, cont N/s 100cc/hr x 20hrs  Chest pain-in the setting of SIRS.  Troponin 3 x 2.  EKG without significant ST or T wave changes.  No known family history of coronary artery disease.  Never smoker. -Monitor for now  Diabetes mellitus-random glucose 184.  Lipid A1c 8. -Resume home Lantus at reduced dose 15 units nightly (home dose 20-30) - SSI- S -Metformin  Hypertension-stable. -Resume home losartan   DVT prophylaxis: Lovenox Code Status:  Full Family Communication: Spouse at bedside Disposition Plan: ~ 2 days Consults called: None Admission status: Inpt, tele  I certify that at the point of admission it is my clinical judgment that the patient will require inpatient hospital care spanning beyond 2 midnights from the point of admission due to high intensity of service, high risk for further deterioration and high frequency of surveillance required.    Bethena Roys MD [facility]  01/03/2022, 6:27 PM

## 2022-01-03 NOTE — Progress Notes (Signed)
Pharmacy Antibiotic Note  Philip Caldwell is a 56 y.o. male admitted on 01/03/2022 with  unknown source of infection .  Pharmacy has been consulted for Vancomycin and cefepime dosing.  Plan: Vancomycin 1750mg  IV loading dose then 1500 mg IV Q 24 hrs. Goal AUC 400-550. Expected AUC: 464  SCr used: 1.59 Cefepime 2gm IV q12h F/Ucxs and clinical progress Monitor V/S, labs and levels as indicated   Height: 5\' 9"  (175.3 cm) Weight: 89.4 kg (197 lb) IBW/kg (Calculated) : 70.7  Temp (24hrs), Avg:97.5 F (36.4 C), Min:97.5 F (36.4 C), Max:97.5 F (36.4 C)  Recent Labs  Lab 01/03/22 1222 01/03/22 1431 01/03/22 1558  WBC 18.1*  --   --   CREATININE 1.59*  --   --   LATICACIDVEN  --  2.7* 2.3*    Estimated Creatinine Clearance: 56.7 mL/min (A) (by C-G formula based on SCr of 1.59 mg/dL (H)).    Allergies  Allergen Reactions   Lisinopril Cough   Penicillins Rash    Has patient had a PCN reaction causing immediate rash, facial/tongue/throat swelling, SOB or lightheadedness with hypotension: Yes Has patient had a PCN reaction causing severe rash involving mucus membranes or skin necrosis: No Has patient had a PCN reaction that required hospitalization No Has patient had a PCN reaction occurring within the last 10 years: No If all of the above answers are "NO", then may proceed with Cephalosporin use.      Antimicrobials this admission: Vancomycin 2/5 >>  cefepime 2/5 >>  Flagyl 2/5  Microbiology results: 2/5 BCx: pending 2/5 UCx: pending   MRSA PCR:   Thank you for allowing pharmacy to be a part of this patients care.  Isac Sarna, BS [place], [place] Clinical Pharmacist Pager [phone] 01/03/2022 6:49 PM

## 2022-01-03 NOTE — ED Notes (Signed)
PA notified of PO2 level.

## 2022-01-03 NOTE — ED Provider Notes (Signed)
[facility] EMERGENCY DEPARTMENT Provider Note   CSN: [id number] Arrival date & time: 01/03/22  1214     History  Chief Complaint  Patient presents with   Chest Pain    Philip Caldwell is a 56 y.o. male.   Chest Pain  Patient presents with chest pain shortness of breath.  States he woke up this morning feeling nauseated.  Went to church and started having episodes of emesis, does not usually have any emesis.  He did have some abdominal cramping when this occurred.  He started having pain in the left side of his chest which is sharp and pierces through the left side of his chest to his back.  He does endorse feeling short of breath and is still nauseated.  The pain is worse with inspiration, not worse with touch.  Has not had pain like this before.  Patient has a history of A. fib 10 years ago, but never needed anticoagulation and converted in the ED.  Past medical history is notable for hyperlipidemia, hypertension, type 2 diabetes.  Patient has no smoking history.  Home Medications Prior to Admission medications   Medication Sig Start Date End Date Taking? Authorizing Provider  Accu-Chek Softclix Lancets lancets Check glucose 3x per day. 11/18/20  Yes Lovena Le, Malena M, DO  Ascorbic Acid (VITAMIN C PO) Take 1 tablet by mouth 3 (three) times a week.   Yes [provider]  blood glucose meter kit and supplies KIT Dispense based on patient and insurance preference. Use up to four times daily as directed. (FOR ICD-9 250.00, 250.01). 07/22/18  Yes Kathie Dike, MD  blood glucose meter kit and supplies KIT Dispense based on patient and insurance preference. Use up to test glucose daily.  (FOR ICD - 10) 11/27/18  Yes [name], MD  Cholecalciferol (VITAMIN D3 PO) Take 1 tablet by mouth 3 (three) times a week.   Yes [provider]  glucose blood (ACCU-CHEK AVIVA PLUS) test strip Check glucose bid. 04/01/21  Yes Lovena Le, Malena M, DO  insulin detemir (LEVEMIR) 100  UNIT/ML injection Inject 0.3 mLs (30 Units total) into the skin at bedtime. Inject 25 units SQ qhs. Patient taking differently: Inject 30 Units into the skin at bedtime. 12/28/21  Yes Nida, Marella Chimes, MD  Insulin Pen Needle (PEN NEEDLES) 31G X 6 MM MISC Use as directed with solostar pen. 11/17/20  Yes Lovena Le, Malena M, DO  Insulin Syringe-Needle U-100 (INSULIN SYRINGE .5CC/31GX5/16") 31G X 5/16" 0.5 ML MISC Use as directed qhs. 12/28/21  Yes Nida, Marella Chimes, MD  losartan (COZAAR) 25 MG tablet Take 2 tablets po qd Patient taking differently: 50 mg daily. 04/01/21  Yes Taylor, Malena M, DO  metFORMIN (GLUCOPHAGE) 500 MG tablet TAKE 1 TABLET BY MOUTH 2 TIMES DAILY WITH A MEAL 12/02/21  Yes Nida, Marella Chimes, MD  hydrocortisone 2.5 % ointment Apply 1 application topically 2 (two) times daily as needed (rash). Patient not taking: Reported on 01/03/2022 10/08/21   Kathyrn Drown, MD      Allergies    Lisinopril and Penicillins    Review of Systems   Review of Systems  Cardiovascular:  Positive for chest pain.   Physical Exam Updated Vital Signs BP 96/85    Pulse (!) 109    Temp (!) 97.5 F (36.4 C) (Oral)    Resp 15    Ht _0  (1.753 m)    Wt 89.4 kg    SpO2 92%    BMI  29.09 kg/m  Physical Exam Vitals and nursing note reviewed. Exam conducted with a chaperone present.  Constitutional:      Appearance: Normal appearance.  HENT:     Head: Normocephalic and atraumatic.  Eyes:     General: No scleral icterus.       Right eye: No discharge.        Left eye: No discharge.     Extraocular Movements: Extraocular movements intact.     Pupils: Pupils are equal, round, and reactive to light.  Cardiovascular:     Rate and Rhythm: Regular rhythm. Tachycardia present.     Pulses: Normal pulses.     Heart sounds: Normal heart sounds. No murmur heard.   No friction rub. No gallop.  Pulmonary:     Effort: Pulmonary effort is normal. Tachypnea present. No respiratory distress.     Breath  sounds: Normal breath sounds.  Chest:     Chest wall: No mass or tenderness.  Abdominal:     General: Abdomen is flat. Bowel sounds are normal. There is no distension.     Palpations: Abdomen is soft.     Tenderness: There is no abdominal tenderness.  Musculoskeletal:     Right lower leg: No edema.     Left lower leg: No edema.  Skin:    General: Skin is warm and dry.     Coloration: Skin is not jaundiced.  Neurological:     Mental Status: He is alert. Mental status is at baseline.     Coordination: Coordination normal.    ED Results / Procedures / Treatments   Labs (all labs ordered are listed, but only abnormal results are displayed) Labs Reviewed  BASIC METABOLIC PANEL - Abnormal; Notable for the following components:      Result Value   Sodium 133 (*)    Glucose, Bld 184 (*)    Creatinine, Ser 1.59 (*)    GFR, Estimated 50 (*)    All other components within normal limits  CBC - Abnormal; Notable for the following components:   WBC 18.1 (*)    RBC 6.35 (*)    Hemoglobin 18.4 (*)    MCHC 36.1 (*)    All other components within normal limits  DIFFERENTIAL - Abnormal; Notable for the following components:   Neutro Abs 16.4 (*)    Lymphs Abs 0.6 (*)    Abs Immature Granulocytes 0.09 (*)    All other components within normal limits  HEPATIC FUNCTION PANEL - Abnormal; Notable for the following components:   Total Protein 9.0 (*)    All other components within normal limits  LACTIC ACID, PLASMA - Abnormal; Notable for the following components:   Lactic Acid, Venous 2.7 (*)    All other components within normal limits  BLOOD GAS, VENOUS - Abnormal; Notable for the following components:   pCO2, Ven 41.2 (*)    pO2, Ven <31.0 (*)    All other components within normal limits  RESP PANEL BY RT-PCR (FLU A&B, COVID) ARPGX2  CULTURE, BLOOD (ROUTINE X 2)  CULTURE, BLOOD (ROUTINE X 2)  URINE CULTURE  BRAIN NATRIURETIC PEPTIDE  URINALYSIS, ROUTINE W REFLEX MICROSCOPIC   PROTIME-INR  APTT  RAPID URINE DRUG SCREEN, HOSP PERFORMED  LACTIC ACID, PLASMA  TROPONIN I (HIGH SENSITIVITY)  TROPONIN I (HIGH SENSITIVITY)    EKG EKG Interpretation  Date/Time:  Sunday January 03 2022 12:21:35 EST Ventricular Rate:  123 PR Interval:  145 QRS Duration: 84 QT Interval:  292 QTC  Calculation: 418 R Axis:   0 Text Interpretation: Sinus tachycardia Probable left atrial enlargement Markedly posterior QRS axis Borderline low voltage, extremity leads Baseline wander in lead(s) V5 Confirmed by Godfrey Pick ([id number]) on 01/03/2022 1:48:16 PM  Radiology CT ABDOMEN PELVIS WO CONTRAST  Result Date: 01/03/2022 CLINICAL DATA:  Nausea, vomiting, chest pain EXAM: CT ABDOMEN AND PELVIS WITHOUT CONTRAST TECHNIQUE: Multidetector CT imaging of the abdomen and pelvis was performed following the standard protocol without IV contrast. RADIATION DOSE REDUCTION: This exam was performed according to the departmental dose-optimization program which includes automated exposure control, adjustment of the mA and/or kV according to patient size and/or use of iterative reconstruction technique. COMPARISON:  05/25/2020 FINDINGS: Lower chest: No acute abnormality. Hepatobiliary: No focal liver abnormality is seen. Low-attenuation of the liver as can be seen with hepatic steatosis. No gallstones, gallbladder wall thickening, or biliary dilatation. Pancreas: Unremarkable. No pancreatic ductal dilatation or surrounding inflammatory changes. Spleen: Normal in size without focal abnormality. Adrenals/Urinary Tract: Adrenal glands are unremarkable. 13.3 x 13 cm right inferior pole hypodense, fluid attenuating renal mass with small areas of mural calcification likely reflecting a mildly complicated cyst. Multiple other smaller right renal cysts. Multiple small hypodense fluid attenuating left renal masses consistent with cysts unchanged in the prior examination with the largest measuring 17 mm. No obstructive uropathy.  Decompressed bladder with contrast filling the bladder. Stomach/Bowel: Stomach is within normal limits. No evidence of bowel wall thickening, distention, or inflammatory changes. Appendix is normal. Diverticulosis without evidence of diverticulitis. Vascular/Lymphatic: No significant vascular findings are present. No enlarged abdominal or pelvic lymph nodes. Reproductive: Prostate is unremarkable. Other: No abdominal wall hernia or abnormality. No abdominopelvic ascites. Musculoskeletal: No acute osseous abnormality. No aggressive osseous lesion. IMPRESSION: 1. No acute abdominal or pelvic pathology. 2. Hepatic steatosis. 3. Multiple bilateral renal cysts with the largest measuring 13.3 x 13 cm arising from the inferior pole of the right kidney. No significant interval change compared with 05/25/2020. Electronically Signed   By: Kathreen Devoid M.D.   On: 01/03/2022 15:31   DG Chest 2 View  Result Date: 01/03/2022 CLINICAL DATA:  Chest pain EXAM: CHEST - 2 VIEW COMPARISON:  Chest x-ray dated 06/10/2012 FINDINGS: Heart size and mediastinal contours are within normal limits. Lungs are clear. No pleural effusion or pneumothorax is seen. Osseous structures about the chest are unremarkable. IMPRESSION: No active cardiopulmonary disease. No evidence of pneumonia or pulmonary edema. Electronically Signed   By: Franki Cabot M.D.   On: 01/03/2022 12:48   CT Angio Chest PE W/Cm &/Or Wo Cm  Result Date: 01/03/2022 CLINICAL DATA:  Chest pain with nausea vomiting. Shortness of breath. EXAM: CT ANGIOGRAPHY CHEST WITH CONTRAST TECHNIQUE: Multidetector CT imaging of the chest was performed using the standard protocol during bolus administration of intravenous contrast. Multiplanar CT image reconstructions and MIPs were obtained to evaluate the vascular anatomy. RADIATION DOSE REDUCTION: This exam was performed according to the departmental dose-optimization program which includes automated exposure control, adjustment of the mA  and/or kV according to patient size and/or use of iterative reconstruction technique. CONTRAST:  57m OMNIPAQUE IOHEXOL 350 MG/ML SOLN COMPARISON:  None. FINDINGS: Cardiovascular: The heart size is normal. No substantial pericardial effusion. No thoracic aortic aneurysm. Bovine arch vessel anatomy noted, normal variant. There is no filling defect within the opacified pulmonary arteries to suggest the presence of an acute pulmonary embolus. Mediastinum/Nodes: No mediastinal lymphadenopathy. There is no hilar lymphadenopathy. The esophagus has normal imaging features. There is no axillary lymphadenopathy.  Lungs/Pleura: 4 mm anterior right upper lobe nodule seen on 64/6. Adjacent perifissural nodules in the right lower lobe on 71/6 measure up to 5 mm. 4 mm subpleural nodule noted left lower lobe on 88/6. Several additional scattered tiny pulmonary nodules identified measuring 4 mm or less in size. No focal airspace consolidation. No pleural effusion. Upper Abdomen: The liver shows diffusely decreased attenuation suggesting fat deposition. 9 mm hypodensity in the dome of the liver is too small to characterize but likely benign. Bilateral renal cysts evident with large exophytic interpolar right renal lesion incompletely visualized but compatible with the large cyst seen on abdomen CT 05/25/2020. Musculoskeletal: No worrisome lytic or sclerotic osseous abnormality. Review of the MIP images confirms the above findings. IMPRESSION: 1. No CT evidence for acute pulmonary embolus. 2. Scattered tiny bilateral pulmonary nodules measuring up to 5 mm in size. These are likely benign. No follow-up needed if patient is low-risk (and has no known or suspected primary neoplasm). Non-contrast chest CT can be considered in 12 months if patient is high-risk. This recommendation follows the consensus statement: Guidelines for Management of Incidental Pulmonary Nodules Detected on CT Images: From the Fleischner Society 2017; Radiology  2017; 284:228-243. 3. Hepatic steatosis. 4. Bilateral renal cysts with large exophytic interpolar right renal lesion incompletely visualized but compatible with the large cyst seen on abdomen CT 05/25/2020. Electronically Signed   By: Misty Stanley M.D.   On: 01/03/2022 13:27    Procedures .Critical Care Performed by: Sherrill Raring, PA-C Authorized by: Sherrill Raring, PA-C   Critical care provider statement:    Critical care time (minutes):  30   Critical care start time:  01/03/2022 2:40 PM   Critical care end time:  01/03/2022 3:10 PM   Critical care was necessary to treat or prevent imminent or life-threatening deterioration of the following conditions:  Sepsis   Critical care was time spent personally by me on the following activities:  Development of treatment plan with patient or surrogate, discussions with consultants, evaluation of patient's response to treatment, examination of patient, ordering and review of laboratory studies, ordering and review of radiographic studies, ordering and performing treatments and interventions, pulse oximetry, re-evaluation of patient's condition and review of old charts   Care discussed with: admitting provider      Medications Ordered in ED Medications  aspirin tablet 325 mg (325 mg Oral Given 01/03/22 1255)  lactated ringers infusion ( Intravenous New Bag/Given 01/03/22 1551)  lactated ringers bolus 1,000 mL (1,000 mLs Intravenous New Bag/Given 01/03/22 1552)  ondansetron (ZOFRAN) injection 4 mg (4 mg Intravenous Given 01/03/22 1256)  morphine (PF) 4 MG/ML injection 4 mg (4 mg Intravenous Given 01/03/22 1257)  sodium chloride 0.9 % bolus 1,000 mL (0 mLs Intravenous Stopped 01/03/22 1416)  iohexol (OMNIPAQUE) 350 MG/ML injection 75 mL (75 mLs Intravenous Contrast Given 01/03/22 1313)  fentaNYL (SUBLIMAZE) injection 50 mcg (50 mcg Intravenous Given 01/03/22 1348)  diazepam (VALIUM) injection 5 mg (5 mg Intravenous Given 01/03/22 1352)  alum & mag hydroxide-simeth  (MAALOX/MYLANTA) 200-200-20 MG/5ML suspension 30 mL (30 mLs Oral Given 01/03/22 1413)    ED Course/ Medical Decision Making/ A&P                           Medical Decision Making Amount and/or Complexity of Data Reviewed Labs: ordered. Radiology: ordered.  Risk OTC drugs. Prescription drug management. Decision regarding hospitalization.   TThis patient presents to the ED for concern of CP  and SOB, this involves an extensive number of treatment options, and is a complaint that carries with it a high risk of complications and morbidity.  The differential diagnosis includes PE, ACS, pneumonia, pneumothorax, dissection, other   Co morbidities that complicate the patient evaluation: Type 2 diabetes, hyperlipidemia, hypertension   Additional history obtained: -Additional history obtained from patient's wife at bedside -External records from outside source obtained and reviewed including: Chart review including previous notes, labs, imaging, consultation notes  Lab Tests: -I ordered, reviewed, and interpreted labs.  The pertinent results include:   Leukocytosis of 18.1 with a left shift.  No gross electrolyte derangement, patient's creatinine is roughly at baseline.  BMP within normal limits, patient is negative for flu and COVID.   Patient is pO2 < 31 on VBG. Lactic acid 2.7  Heart score 3.   EKG -Sinus tachycardia, no ischemic ST changes noted   Imaging Studies ordered: -I ordered imaging studies including chest x-ray and CTA chest -I independently visualized and interpreted imaging which showed no evidence of pneumonia or pulmonary embolism. -I agree with the radiologist interpretation   Medicines ordered and prescription drug management: -I ordered medication including morphine, aspirin, Zofran for pain  -Reevaluation of the patient after these medicines showed that the patient worsened -I have reviewed the patients home medicines and have made adjustments as  needed   Consultations Obtained: I requested consultation with the hospitalist,  and discussed lab and imaging findings as well as pertinent plan - they recommend: Admission   ED Course: Patient is a very pleasant 56 year old man presenting with chest pain and shortness of breath.  He started having abdominal pain this morning which is initially associated with the vomiting.  He does state that the last day he had some intermittent abdominal cramping and 1 episode of diarrhea.  He is tachycardic and tachypneic on exam, he was hypoxic once at 86% which improved with elevation of the head of the bed.  Lungs are clear to auscultation, his rhythm is regular.  Abdomen is without any focal tenderness or guarding.  Initially, given chest pain shortness of breath and tachycardia proceeded with CTA to evaluate for PE.  Chest x-ray did not show any pneumonia, PE CTA was also negative for PE or source.  There are some nodules which I do not think are related.  On reevaluation he is still having persistent chest pain despite morphine, aspirin, GI cocktail.  We will cast a wider differential and proceed with CT abdomen without contrast given the elevation of his creatinine.  We will also check a lactic and do more of a septic work-up despite he is not febrile.  Blood pressures are starting to get soft although he is not hypotensive. CT abdomen is unrevealing of a source.    However, patient's lactic acid came back at 2.7.  He is additionally tachycardic with a white count of 18.1 meeting sepsis criteria.  Unfortunately, I am unable to identify a source of why he is so septic.  He is not requiring supplemental oxygen at this time.  I do think the patient needs admission for sepsis of unknown origin.  Lactated Ringer's ordered, will proceed with broad-spectrum antibiotics and consult hospitalist service.    Cardiac Monitoring: The patient was maintained on a cardiac monitor.  I personally viewed and interpreted  the cardiac monitored which showed an underlying rhythm of: Sinus tachycardia   Reevaluation: After the interventions noted above, I reevaluated the patient and found that they have :stayed  the same   Dispostion: Admit          Final Clinical Impression(s) / ED Diagnoses Final diagnoses:  Sepsis, due to unspecified organism, unspecified whether acute organ dysfunction present [facility])    Rx / DC Orders ED Discharge Orders     None         Sherrill Raring, PA-C 01/03/22 1825    Godfrey Pick, MD 01/05/22 1445

## 2022-01-04 DIAGNOSIS — E119 Type 2 diabetes mellitus without complications: Secondary | ICD-10-CM

## 2022-01-04 DIAGNOSIS — R079 Chest pain, unspecified: Principal | ICD-10-CM

## 2022-01-04 DIAGNOSIS — D72825 Bandemia: Secondary | ICD-10-CM

## 2022-01-04 DIAGNOSIS — R6510 Systemic inflammatory response syndrome (SIRS) of non-infectious origin without acute organ dysfunction: Secondary | ICD-10-CM

## 2022-01-04 DIAGNOSIS — E778 Other disorders of glycoprotein metabolism: Secondary | ICD-10-CM

## 2022-01-04 DIAGNOSIS — E1122 Type 2 diabetes mellitus with diabetic chronic kidney disease: Secondary | ICD-10-CM

## 2022-01-04 DIAGNOSIS — R112 Nausea with vomiting, unspecified: Secondary | ICD-10-CM | POA: Diagnosis not present

## 2022-01-04 DIAGNOSIS — N183 Chronic kidney disease, stage 3 unspecified: Secondary | ICD-10-CM

## 2022-01-04 DIAGNOSIS — I129 Hypertensive chronic kidney disease with stage 1 through stage 4 chronic kidney disease, or unspecified chronic kidney disease: Secondary | ICD-10-CM

## 2022-01-04 DIAGNOSIS — E871 Hypo-osmolality and hyponatremia: Secondary | ICD-10-CM

## 2022-01-04 DIAGNOSIS — N1831 Chronic kidney disease, stage 3a: Secondary | ICD-10-CM

## 2022-01-04 DIAGNOSIS — E872 Acidosis, unspecified: Secondary | ICD-10-CM

## 2022-01-04 DIAGNOSIS — E8809 Other disorders of plasma-protein metabolism, not elsewhere classified: Secondary | ICD-10-CM | POA: Insufficient documentation

## 2022-01-04 DIAGNOSIS — I10 Essential (primary) hypertension: Secondary | ICD-10-CM

## 2022-01-04 LAB — BASIC METABOLIC PANEL
Anion gap: 7 (ref 5–15)
BUN: 16 mg/dL (ref 6–20)
CO2: 23 mmol/L (ref 22–32)
Calcium: 7.8 mg/dL — ABNORMAL LOW (ref 8.9–10.3)
Chloride: 104 mmol/L (ref 98–111)
Creatinine, Ser: 1.33 mg/dL — ABNORMAL HIGH (ref 0.61–1.24)
GFR, Estimated: >60 mL/min (ref >60)
Glucose, Bld: 146 mg/dL — ABNORMAL HIGH (ref 70–99)
Potassium: 3.8 mmol/L (ref 3.5–5.1)
Sodium: 134 mmol/L — ABNORMAL LOW (ref 135–145)

## 2022-01-04 LAB — GLUCOSE, CAPILLARY
Glucose-Capillary: 171 mg/dL — ABNORMAL HIGH (ref 70–99)
Glucose-Capillary: 131 mg/dL — ABNORMAL HIGH (ref 70–99)
Glucose-Capillary: 149 mg/dL — ABNORMAL HIGH (ref 70–99)
Glucose-Capillary: 151 mg/dL — ABNORMAL HIGH (ref 70–99)

## 2022-01-04 LAB — CBC
HCT: 37.1 % — ABNORMAL LOW (ref 39.0–52.0)
Hemoglobin: 13.6 g/dL (ref 13.0–17.0)
MCH: 29.8 pg (ref 26.0–34.0)
MCHC: 36.7 g/dL — ABNORMAL HIGH (ref 30.0–36.0)
MCV: 81.2 fL (ref 80.0–100.0)
Platelets: 259 10*3/uL (ref 150–400)
RBC: 4.57 MIL/uL (ref 4.22–5.81)
RDW: 14.2 % (ref 11.5–15.5)
WBC: 8.1 10*3/uL (ref 4.0–10.5)
nRBC: 0 % (ref 0.0–0.2)

## 2022-01-04 LAB — PROCALCITONIN: Procalcitonin: 1.42 ng/mL

## 2022-01-04 LAB — HIV ANTIBODY (ROUTINE TESTING W REFLEX): HIV Screen 4th Generation wRfx: NONREACTIVE

## 2022-01-04 MED ORDER — SODIUM CHLORIDE 0.9 % IV SOLN
2.0000 g | Freq: Three times a day (TID) | INTRAVENOUS | Status: DC
  Administered 2022-01-04 (×2): 2 g via INTRAVENOUS
  Filled 2022-01-04 (×3): qty 2

## 2022-01-04 NOTE — Progress Notes (Signed)
PROGRESS NOTE  ZARON ZWIEFELHOFER PIR:[id number] DOB: 31-Oct-1966   PCP: Coral Spikes, DO  Patient is from: Home.  Lives with family.  Independently ambulates at baseline.  DOA: 01/03/2022 LOS: 1  Chief complaints:  Chief Complaint  Patient presents with   Chest Pain     Brief Narrative / Interim history: 56 year old M with PMH of A-fib not on AC, HTN, DM-2 and CKD-3A presenting with acute left-sided sharp chest pain, nausea, vomiting and abdominal pain, and admitted for SIRS.  He was tachycardic and tachypneic.  Had leukocytosis to 18 with left shift.  Serial troponin and BNP negative.  Lactic acid 2.7 > 2.3.  VBG negative.  Cr 1.6.  CXR and CTA chest/abdomen/pelvis without acute finding except for scattered tiny bilateral pulmonary nodules measuring up to 5 mm in size.  Cultures obtained.  Started on broad-spectrum antibiotics and admitted for SIRS.  SIRS resolved.  Chest pain and GI symptoms resolved as well.  Procalcitonin markedly elevated.  Blood culture pending.  Remains on broad-spectrum antibiotics   Subjective: Seen and examined earlier this morning.  No major events overnight of this morning.  No complaints.  He denies chest pain, shortness of breath, cough, GI or UTI symptoms.  Patient's son at bedside.  Objective: Vitals:   01/04/22 0128 01/04/22 0534 01/04/22 0808 01/04/22 1226  BP: 118/65 104/65 115/78 122/76  Pulse: 93 81  72  Resp: 19 19  16   Temp: 98.8 F (37.1 C) 99.3 F (37.4 C)  (!) 97.4 F (36.3 C)  TempSrc: Oral Oral  Oral  SpO2: 96% 96%  99%  Weight:      Height:        Examination:  GENERAL: No apparent distress.  Nontoxic. HEENT: MMM.  Vision and hearing grossly intact.  NECK: Supple.  No apparent JVD.  RESP:  No IWOB.  Fair aeration bilaterally. CVS:  RRR. Heart sounds normal.  ABD/GI/GU: BS+. Abd soft, NTND.  MSK/EXT:  Moves extremities. No apparent deformity. No edema.  SKIN: no apparent skin lesion or wound NEURO: Awake, alert and oriented  appropriately.  No apparent focal neuro deficit. PSYCH: Calm. Normal affect.   Procedures:  None  Microbiology summarized: [id number] and influenza PCR nonreactive. Blood cultures pending. Urine culture pending.  Assessment and Plan: * SIRS (systemic inflammatory response syndrome) ([place])- (present on admission) No clear source of infection but concerning given her leukocytosis, lactic acidosis and elevated procalcitonin.  UA not suggestive for UTI.  CTA chest/abdomen/pelvis unrevealing.  Chest pain and GI symptoms resolved. -Continue broad-spectrum antibiotics pending blood culture  Nausea, vomiting and abdominal pain Unclear etiology.  Abdominal exam benign.  CT abdomen and pelvis without significant finding.  LFT and lipase within normal.  Resolved.  Left-sided chest pain Unclear etiology but resolved.  Cardiac work-up and CTA unrevealing but procalcitonin elevated.  Radiologic finding could be lagging behind but he has no respiratory symptoms.  Bandemia Resolved.  Lactic acidosis Hemodynamically stable. -Trend until it resolves.  Hypoproteinemia (HCC) Likely from dehydration.  No significant protein gap. -Recheck in the morning  CKD (chronic kidney disease) stage 3, GFR 30-59 ml/min (HCC) Creatinine at baseline and improving. -Discontinue IV fluid  Type 2 diabetes mellitus without complication, without long-term current use of insulin (HCC) On Levemir and metformin at home. -Check hemoglobin A1c -Continue current insulin regimen   Essential hypertension, benign- (present on admission) Likely from dehydration.  Improving with IV fluid. -Continue monitoring         Body mass index  is 29.09 kg/m.         DVT prophylaxis:  enoxaparin (LOVENOX) injection 40 mg Start: 01/03/22 1845 SCDs Start: 01/03/22 1837  Code Status: Full code Family Communication: Updated patient's son at bedside. Level of care: Telemetry Status is: Inpatient Remains inpatient  appropriate because: Need for broad-spectrum antibiotics pending cultures.            Consultants:  None   Sch Meds:  Scheduled Meds:  aspirin  325 mg Oral Daily   enoxaparin (LOVENOX) injection  40 mg Subcutaneous Q24H   insulin aspart  0-5 Units Subcutaneous QHS   insulin aspart  0-9 Units Subcutaneous TID WC   insulin detemir  15 Units Subcutaneous QHS   losartan  50 mg Oral Daily   Continuous Infusions:  sodium chloride 100 mL/hr at 01/04/22 1017   ceFEPime (MAXIPIME) IV     metronidazole 500 mg (01/04/22 0612)   vancomycin     PRN Meds:.acetaminophen **OR** acetaminophen, ondansetron **OR** ondansetron (ZOFRAN) IV, polyethylene glycol  Antimicrobials: Anti-infectives (From admission, onward)    Start     Dose/Rate Route Frequency Ordered Stop   01/04/22 2000  vancomycin (VANCOREADY) IVPB 1500 mg/300 mL       See Hyperspace for full Linked Orders Report.   1,500 mg 150 mL/hr over 120 Minutes Intravenous Every 24 hours 01/03/22 1847     01/04/22 1600  ceFEPIme (MAXIPIME) 2 g in sodium chloride 0.9 % 100 mL IVPB        2 g 200 mL/hr over 30 Minutes Intravenous Every 8 hours 01/04/22 1200     01/03/22 2000  vancomycin (VANCOREADY) IVPB 1750 mg/350 mL       See Hyperspace for full Linked Orders Report.   1,750 mg 175 mL/hr over 120 Minutes Intravenous  Once 01/03/22 1847 01/04/22 1205   01/03/22 1900  ceFEPIme (MAXIPIME) 2 g in sodium chloride 0.9 % 100 mL IVPB  Status:  Discontinued        2 g 200 mL/hr over 30 Minutes Intravenous Every 12 hours 01/03/22 1847 01/04/22 1200   01/03/22 1830  metroNIDAZOLE (FLAGYL) IVPB 500 mg        500 mg 100 mL/hr over 60 Minutes Intravenous Every 12 hours 01/03/22 1827          I have personally reviewed the following labs and images: CBC: Recent Labs  Lab 01/03/22 1222 01/04/22 0427  WBC 18.1* 8.1  NEUTROABS 16.4*  --   HGB 18.4* 13.6  HCT 50.9 37.1*  MCV 80.2 81.2  PLT 372 259   BMP &GFR Recent Labs  Lab  01/03/22 1222 01/04/22 0427  NA 133* 134*  K 4.8 3.8  CL 101 104  CO2 22 23  GLUCOSE 184* 146*  BUN 20 16  CREATININE 1.59* 1.33*  CALCIUM 9.6 7.8*   Estimated Creatinine Clearance: 67.8 mL/min (A) (by C-G formula based on SCr of 1.33 mg/dL (H)). Liver & Pancreas: Recent Labs  Lab 01/03/22 1222  AST 30  ALT 41  ALKPHOS 90  BILITOT 0.5  PROT 9.0*  ALBUMIN 4.8   No results for input(s): LIPASE, AMYLASE in the last 168 hours. No results for input(s): AMMONIA in the last 168 hours. Diabetic: No results for input(s): HGBA1C in the last 72 hours. Recent Labs  Lab 01/03/22 1844 01/03/22 2137 01/04/22 0706 01/04/22 1123  GLUCAP 125* 189* 171* 131*   Cardiac Enzymes: No results for input(s): CKTOTAL, CKMB, CKMBINDEX, TROPONINI in the last 168 hours. No results  for input(s): PROBNP in the last 8760 hours. Coagulation Profile: Recent Labs  Lab 01/03/22 1431  INR 0.9   Thyroid Function Tests: No results for input(s): TSH, T4TOTAL, FREET4, T3FREE, THYROIDAB in the last 72 hours. Lipid Profile: No results for input(s): CHOL, HDL, LDLCALC, TRIG, CHOLHDL, LDLDIRECT in the last 72 hours. Anemia Panel: No results for input(s): VITAMINB12, FOLATE, FERRITIN, TIBC, IRON, RETICCTPCT in the last 72 hours. Urine analysis:    Component Value Date/Time   COLORURINE YELLOW 01/03/2022 1550   APPEARANCEUR CLEAR 01/03/2022 1550   LABSPEC 1.010 01/03/2022 1550   PHURINE 5.5 01/03/2022 1550   GLUCOSEU NEGATIVE 01/03/2022 1550   HGBUR NEGATIVE 01/03/2022 1550   BILIRUBINUR NEGATIVE 01/03/2022 1550   BILIRUBINUR + 12/19/2019 1106   KETONESUR NEGATIVE 01/03/2022 1550   PROTEINUR NEGATIVE 01/03/2022 1550   NITRITE NEGATIVE 01/03/2022 [facility] 01/03/2022 1550   Sepsis Labs: Invalid input(s): PROCALCITONIN, [place]  Microbiology: Recent Results (from the past 240 hour(s))  Resp Panel by RT-PCR (Flu A&B, Covid) Nasopharyngeal Swab     Status: None    Collection Time: 01/03/22 12:26 PM   Specimen: Nasopharyngeal Swab; Nasopharyngeal(NP) swabs in vial transport medium  Result Value Ref Range Status   SARS Coronavirus 2 by RT PCR NEGATIVE NEGATIVE Final    Comment: (NOTE) SARS-CoV-2 target nucleic acids are NOT DETECTED.  The SARS-CoV-2 RNA is generally detectable in upper respiratory specimens during the acute phase of infection. The lowest concentration of SARS-CoV-2 viral copies this assay can detect is 138 copies/mL. A negative result does not preclude SARS-Cov-2 infection and should not be used as the sole basis for treatment or other patient management decisions. A negative result may occur with  improper specimen collection/handling, submission of specimen other than nasopharyngeal swab, presence of viral mutation(s) within the areas targeted by this assay, and inadequate number of viral copies(<138 copies/mL). A negative result must be combined with clinical observations, patient history, and epidemiological information. The expected result is Negative.  Fact Sheet for Patients:  EntrepreneurPulse.com.au  Fact Sheet for Healthcare Providers:  IncredibleEmployment.be  This test is no t yet approved or cleared by the [place] FDA and  has been authorized for detection and/or diagnosis of SARS-CoV-2 by FDA under an Emergency Use Authorization (EUA). This EUA will remain  in effect (meaning this test can be used) for the duration of the COVID-19 declaration under Section 564(b)(1) of the Act, 21 U.S.C.section 360bbb-3(b)(1), unless the authorization is terminated  or revoked sooner.       Influenza A by PCR NEGATIVE NEGATIVE Final   Influenza B by PCR NEGATIVE NEGATIVE Final    Comment: (NOTE) The Xpert Xpress SARS-CoV-2/FLU/RSV plus assay is intended as an aid in the diagnosis of influenza from Nasopharyngeal swab specimens and should not be used as a sole basis for treatment.  Nasal washings and aspirates are unacceptable for Xpert Xpress SARS-CoV-2/FLU/RSV testing.  Fact Sheet for Patients: EntrepreneurPulse.com.au  Fact Sheet for Healthcare Providers: IncredibleEmployment.be  This test is not yet approved or cleared by the [place] FDA and has been authorized for detection and/or diagnosis of SARS-CoV-2 by FDA under an Emergency Use Authorization (EUA). This EUA will remain in effect (meaning this test can be used) for the duration of the COVID-19 declaration under Section 564(b)(1) of the Act, 21 U.S.C. section 360bbb-3(b)(1), unless the authorization is terminated or revoked.  Performed at [facility], [address], [place], [place] [address]   Blood culture (routine x 2)  Status: None (Preliminary result)   Collection Time: 01/03/22  2:31 PM   Specimen: BLOOD RIGHT HAND  Result Value Ref Range Status   Specimen Description BLOOD RIGHT HAND  Final   Special Requests   Final    BOTTLES DRAWN AEROBIC AND ANAEROBIC Blood Culture results may not be optimal due to an excessive volume of blood received in culture bottles Performed at [facility], [address], [place], [place] [address]    Culture PENDING  Incomplete   Report Status PENDING  Incomplete  Blood culture (routine x 2)     Status: None (Preliminary result)   Collection Time: 01/03/22  2:31 PM   Specimen: Left Antecubital; Blood  Result Value Ref Range Status   Specimen Description LEFT ANTECUBITAL  Final   Special Requests   Final    BOTTLES DRAWN AEROBIC AND ANAEROBIC Blood Culture results may not be optimal due to an excessive volume of blood received in culture bottles Performed at [facility], [address] [place], [place] [address]    Culture PENDING  Incomplete   Report Status PENDING  Incomplete    Radiology Studies: CT ABDOMEN PELVIS WO CONTRAST  Result Date: 01/03/2022 CLINICAL DATA:  Nausea, vomiting, chest pain EXAM: CT  ABDOMEN AND PELVIS WITHOUT CONTRAST TECHNIQUE: Multidetector CT imaging of the abdomen and pelvis was performed following the standard protocol without IV contrast. RADIATION DOSE REDUCTION: This exam was performed according to the departmental dose-optimization program which includes automated exposure control, adjustment of the mA and/or kV according to patient size and/or use of iterative reconstruction technique. COMPARISON:  05/25/2020 FINDINGS: Lower chest: No acute abnormality. Hepatobiliary: No focal liver abnormality is seen. Low-attenuation of the liver as can be seen with hepatic steatosis. No gallstones, gallbladder wall thickening, or biliary dilatation. Pancreas: Unremarkable. No pancreatic ductal dilatation or surrounding inflammatory changes. Spleen: Normal in size without focal abnormality. Adrenals/Urinary Tract: Adrenal glands are unremarkable. 13.3 x 13 cm right inferior pole hypodense, fluid attenuating renal mass with small areas of mural calcification likely reflecting a mildly complicated cyst. Multiple other smaller right renal cysts. Multiple small hypodense fluid attenuating left renal masses consistent with cysts unchanged in the prior examination with the largest measuring 17 mm. No obstructive uropathy. Decompressed bladder with contrast filling the bladder. Stomach/Bowel: Stomach is within normal limits. No evidence of bowel wall thickening, distention, or inflammatory changes. Appendix is normal. Diverticulosis without evidence of diverticulitis. Vascular/Lymphatic: No significant vascular findings are present. No enlarged abdominal or pelvic lymph nodes. Reproductive: Prostate is unremarkable. Other: No abdominal wall hernia or abnormality. No abdominopelvic ascites. Musculoskeletal: No acute osseous abnormality. No aggressive osseous lesion. IMPRESSION: 1. No acute abdominal or pelvic pathology. 2. Hepatic steatosis. 3. Multiple bilateral renal cysts with the largest measuring 13.3  x 13 cm arising from the inferior pole of the right kidney. No significant interval change compared with 05/25/2020. Electronically Signed   By: Kathreen Devoid M.D.   On: 01/03/2022 15:31   CT Angio Chest PE W/Cm &/Or Wo Cm  Result Date: 01/03/2022 CLINICAL DATA:  Chest pain with nausea vomiting. Shortness of breath. EXAM: CT ANGIOGRAPHY CHEST WITH CONTRAST TECHNIQUE: Multidetector CT imaging of the chest was performed using the standard protocol during bolus administration of intravenous contrast. Multiplanar CT image reconstructions and MIPs were obtained to evaluate the vascular anatomy. RADIATION DOSE REDUCTION: This exam was performed according to the departmental dose-optimization program which includes automated exposure control, adjustment of the mA and/or kV according to patient size and/or  use of iterative reconstruction technique. CONTRAST:  31mL OMNIPAQUE IOHEXOL 350 MG/ML SOLN COMPARISON:  None. FINDINGS: Cardiovascular: The heart size is normal. No substantial pericardial effusion. No thoracic aortic aneurysm. Bovine arch vessel anatomy noted, normal variant. There is no filling defect within the opacified pulmonary arteries to suggest the presence of an acute pulmonary embolus. Mediastinum/Nodes: No mediastinal lymphadenopathy. There is no hilar lymphadenopathy. The esophagus has normal imaging features. There is no axillary lymphadenopathy. Lungs/Pleura: 4 mm anterior right upper lobe nodule seen on 64/6. Adjacent perifissural nodules in the right lower lobe on 71/6 measure up to 5 mm. 4 mm subpleural nodule noted left lower lobe on 88/6. Several additional scattered tiny pulmonary nodules identified measuring 4 mm or less in size. No focal airspace consolidation. No pleural effusion. Upper Abdomen: The liver shows diffusely decreased attenuation suggesting fat deposition. 9 mm hypodensity in the dome of the liver is too small to characterize but likely benign. Bilateral renal cysts evident with  large exophytic interpolar right renal lesion incompletely visualized but compatible with the large cyst seen on abdomen CT 05/25/2020. Musculoskeletal: No worrisome lytic or sclerotic osseous abnormality. Review of the MIP images confirms the above findings. IMPRESSION: 1. No CT evidence for acute pulmonary embolus. 2. Scattered tiny bilateral pulmonary nodules measuring up to 5 mm in size. These are likely benign. No follow-up needed if patient is low-risk (and has no known or suspected primary neoplasm). Non-contrast chest CT can be considered in 12 months if patient is high-risk. This recommendation follows the consensus statement: Guidelines for Management of Incidental Pulmonary Nodules Detected on CT Images: From the Fleischner Society 2017; Radiology 2017; 284:228-243. 3. Hepatic steatosis. 4. Bilateral renal cysts with large exophytic interpolar right renal lesion incompletely visualized but compatible with the large cyst seen on abdomen CT 05/25/2020. Electronically Signed   By: Misty Stanley M.D.   On: 01/03/2022 13:27      Demarr Kluever T. Cecilia  If 7PM-7AM, please contact night-coverage [email or web address] 01/04/2022, 1:06 PM

## 2022-01-04 NOTE — Assessment & Plan Note (Addendum)
No clear source of infection but concerning given her leukocytosis, lactic acidosis and elevated procalcitonin.  UA not suggestive for UTI.  CTA chest/abdomen/pelvis unrevealing.  Chest pain and GI symptoms resolved.  Procalcitonin improved.  Blood and urine culture NGTD. -Antibiotic de-escalated to p.o. cefdinir 300 mg twice daily for 3 more days

## 2022-01-04 NOTE — Assessment & Plan Note (Addendum)
On Levemir and metformin at home. -Discharged on home medication.

## 2022-01-04 NOTE — Assessment & Plan Note (Signed)
Unclear etiology.  Abdominal exam benign.  CT abdomen and pelvis without significant finding.  LFT and lipase within normal.  Resolved.

## 2022-01-04 NOTE — Assessment & Plan Note (Addendum)
Likely from dehydration.  Improved with IV hydration. -Recheck at follow-up

## 2022-01-04 NOTE — TOC Progression Note (Signed)
Transition of Care [facility]) - Progression Note    Patient Details  Name: Philip Caldwell MRN: [id number] Date of Birth: 10/30/1966  Transition of Care [facility]) CM/SW Contact  Salome Arnt, [place] Phone Number: 01/04/2022, 11:02 AM  Clinical Narrative:   Transition of Care [facility]) Screening Note   Patient Details  Name: Philip Caldwell Date of Birth: [birth year]   Transition of Care [facility]) CM/SW Contact:    Salome Arnt, LCSW Phone Number: 01/04/2022, 11:02 AM    Transition of Care Department [facility]) has reviewed patient and no TOC needs have been identified at this time. We will continue to monitor patient advancement through interdisciplinary progression rounds. If new patient transition needs arise, please place a TOC consult.         Barriers to Discharge: Continued Medical Work up  Expected Discharge Plan and Services                                                 Social Determinants of Health (SDOH) Interventions    Readmission Risk Interventions No flowsheet data found.

## 2022-01-04 NOTE — Assessment & Plan Note (Addendum)
Improved.  Likely from metformin and renal failure.  He al;so has SIRS without clear source but hemodynamically stable

## 2022-01-04 NOTE — Assessment & Plan Note (Addendum)
Likely from dehydration.

## 2022-01-04 NOTE — Assessment & Plan Note (Signed)
Resolved

## 2022-01-04 NOTE — Hospital Course (Addendum)
56 year old M with PMH of A-fib not on AC, HTN, DM-2 and CKD-3A presenting with acute left-sided sharp chest pain, nausea, vomiting and abdominal pain, and admitted for SIRS.  He was tachycardic and tachypneic.  Had leukocytosis to 18 with left shift.  Serial troponin and BNP negative.  Lactic acid 2.7 > 2.3.  VBG negative.  Cr 1.6.  CXR and CTA chest/abdomen/pelvis without acute finding except for scattered tiny bilateral pulmonary nodules measuring up to 5 mm in size.  Cultures obtained.  Started on broad-spectrum antibiotics and admitted for SIRS.  SIRS resolved.  Chest pain and GI symptoms resolved as well.  Procalcitonin markedly elevated but improved tremendously.  Blood and urine cultures NGTD.  Discharged on p.o. cefdinir 300 mg twice daily for 3 more days.  Encouraged to follow-up with PCP in 1 to 2 weeks or sooner if needed.

## 2022-01-04 NOTE — Assessment & Plan Note (Signed)
Unclear etiology but resolved.  Cardiac work-up and CTA unrevealing but procalcitonin elevated.  Radiologic finding could be lagging behind but he has no respiratory symptoms.

## 2022-01-04 NOTE — Assessment & Plan Note (Addendum)
Improved. -Recheck in 1 to 2 weeks

## 2022-01-05 DIAGNOSIS — N1831 Chronic kidney disease, stage 3a: Secondary | ICD-10-CM

## 2022-01-05 DIAGNOSIS — E8809 Other disorders of plasma-protein metabolism, not elsewhere classified: Secondary | ICD-10-CM

## 2022-01-05 LAB — RENAL FUNCTION PANEL
Albumin: 3.1 g/dL — ABNORMAL LOW (ref 3.5–5.0)
Anion gap: 6 (ref 5–15)
BUN: 12 mg/dL (ref 6–20)
CO2: 26 mmol/L (ref 22–32)
Calcium: 8.2 mg/dL — ABNORMAL LOW (ref 8.9–10.3)
Chloride: 105 mmol/L (ref 98–111)
Creatinine, Ser: 1.31 mg/dL — ABNORMAL HIGH (ref 0.61–1.24)
GFR, Estimated: >60 mL/min (ref >60)
Glucose, Bld: 143 mg/dL — ABNORMAL HIGH (ref 70–99)
Phosphorus: 2.7 mg/dL (ref 2.5–4.6)
Potassium: 4.1 mmol/L (ref 3.5–5.1)
Sodium: 137 mmol/L (ref 135–145)

## 2022-01-05 LAB — GLUCOSE, CAPILLARY: Glucose-Capillary: 130 mg/dL — ABNORMAL HIGH (ref 70–99)

## 2022-01-05 LAB — URINE CULTURE: Culture: NO GROWTH

## 2022-01-05 LAB — CBC
HCT: 39.7 % (ref 39.0–52.0)
Hemoglobin: 13.6 g/dL (ref 13.0–17.0)
MCH: 27.4 pg (ref 26.0–34.0)
MCHC: 34.3 g/dL (ref 30.0–36.0)
MCV: 80 fL (ref 80.0–100.0)
Platelets: 261 10*3/uL (ref 150–400)
RBC: 4.96 MIL/uL (ref 4.22–5.81)
RDW: 14 % (ref 11.5–15.5)
WBC: 8.1 10*3/uL (ref 4.0–10.5)
nRBC: 0 % (ref 0.0–0.2)

## 2022-01-05 LAB — PROCALCITONIN: Procalcitonin: 0.64 ng/mL

## 2022-01-05 LAB — MAGNESIUM: Magnesium: 2 mg/dL (ref 1.7–2.4)

## 2022-01-05 MED ORDER — CEFDINIR 300 MG PO CAPS: 300.0000 mg | ORAL_CAPSULE | Freq: Two times a day (BID) | ORAL | 0 refills | Status: DC

## 2022-01-05 NOTE — Progress Notes (Signed)
AVS given and explained to patient and family. 

## 2022-01-05 NOTE — Discharge Summary (Signed)
Physician Discharge Summary  Philip Caldwell RVU:[id number] DOB: January 04, 1966 DOA: 01/03/2022  PCP: Coral Spikes, DO  Admit date: 01/03/2022 Discharge date: 01/05/2022 Admitted From: Home Disposition:  Home Recommendations for Outpatient Follow-up:  Follow ups as below. Please obtain CBC/BMP/Mag at follow up Please follow up on the following pending results: none  Home Health: Not needed Equipment/Devices: Not needed  Discharge Condition: Stable CODE STATUS: Full code  Follow-up [name], [name], DO. Schedule an appointment as soon as possible for a visit in 1 week(s).   Specialty: Family Medicine Contact information: [place] [address] [phone]                 56 year old M with PMH of A-fib not on AC, HTN, DM-2 and CKD-3A presenting with acute left-sided sharp chest pain, nausea, vomiting and abdominal pain, and admitted for SIRS.  He was tachycardic and tachypneic.  Had leukocytosis to 18 with left shift.  Serial troponin and BNP negative.  Lactic acid 2.7 > 2.3.  VBG negative.  Cr 1.6.  CXR and CTA chest/abdomen/pelvis without acute finding except for scattered tiny bilateral pulmonary nodules measuring up to 5 mm in size.  Cultures obtained.  Started on broad-spectrum antibiotics and admitted for SIRS.  SIRS resolved.  Chest pain and GI symptoms resolved as well.  Procalcitonin markedly elevated but improved tremendously.  Blood and urine cultures NGTD.  Discharged on p.o. cefdinir 300 mg twice daily for 3 more days.  Encouraged to follow-up with PCP in 1 to 2 weeks or sooner if needed.   See individual problem list below for more on hospital course.  Discharge Diagnoses:  Assessment and Plan: * SIRS (systemic inflammatory response syndrome) ([place])- (present on admission) No clear source of infection but concerning given her leukocytosis, lactic acidosis and elevated procalcitonin.  UA not suggestive for UTI.  CTA  chest/abdomen/pelvis unrevealing.  Chest pain and GI symptoms resolved.  Procalcitonin improved.  Blood and urine culture NGTD. -Antibiotic de-escalated to p.o. cefdinir 300 mg twice daily for 3 more days  Nausea, vomiting and abdominal pain- (present on admission) Unclear etiology.  Abdominal exam benign.  CT abdomen and pelvis without significant finding.  LFT and lipase within normal.  Resolved.  Left-sided chest pain- (present on admission) Unclear etiology but resolved.  Cardiac work-up and CTA unrevealing but procalcitonin elevated.  Radiologic finding could be lagging behind but he has no respiratory symptoms.  Bandemia Resolved.  Lactic acidosis Improved.  Likely from metformin and renal failure.  He al;so has SIRS without clear source but hemodynamically stable   Hyperproteinemia Likely from dehydration.   CKD-3A Improved. -Recheck in 1 to 2 weeks  Type 2 diabetes mellitus without complication, without long-term current use of insulin (HCC) On Levemir and metformin at home. -Discharged on home medication.   Hyponatremia- (present on admission) Resolved.  Essential hypertension, benign- (present on admission) Likely from dehydration.  Improved with IV hydration. -Recheck at follow-up       Body mass index is 29.09 kg/m.           Discharge Exam: Vitals:   01/04/22 1226 01/04/22 2153 01/05/22 0557 01/05/22 0837  BP: 122/76 124/77 135/85   Pulse: 72 68 (!) 58 75  Temp: (!) 97.4 F (36.3 C) 97.7 F (36.5 C) 97.8 F (36.6 C)   Resp: _0 Height:      Weight:      SpO2: 99% 97%  99%   TempSrc: Oral  Oral   BMI (Calculated):         GENERAL: No apparent distress.  Nontoxic. HEENT: MMM.  Vision and hearing grossly intact.  NECK: Supple.  No apparent JVD.  RESP:  No IWOB.  Fair aeration bilaterally. CVS:  RRR. Heart sounds normal.  ABD/GI/GU: Bowel sounds present. Soft. Non tender.  MSK/EXT:  Moves extremities. No apparent deformity. No  edema.  SKIN: no apparent skin lesion or wound NEURO: Awake and alert.  Oriented appropriately.  No apparent focal neuro deficit. PSYCH: Calm. Normal affect.   Discharge Instructions  Discharge Instructions     Call MD for:  difficulty breathing, headache or visual disturbances   Complete by: As directed    Call MD for:  extreme fatigue   Complete by: As directed    Call MD for:  persistant nausea and vomiting   Complete by: As directed    Call MD for:  severe uncontrolled pain   Complete by: As directed    Call MD for:  temperature >100.4   Complete by: As directed    Diet - low sodium heart healthy   Complete by: As directed    Diet Carb Modified   Complete by: As directed    Discharge instructions   Complete by: As directed    It has been a pleasure taking care of you!  You were hospitalized with left-sided chest pain, nausea, vomiting and abdominal pain.  Initially, there was a concern about infection but the test as we have not did not pinpoint the source of infection.  However, your symptoms and infection markers improved.  We are discharging you on antibiotics for 3 more days to complete treatment course for possible infection.  Please review your new medication list and the directions on your medications before you take them.  Avoid any over-the-counter pain medication other than plain Tylenol.  Follow-up with your primary care doctor in 1 to 2 weeks or sooner if needed.   Take care,   Increase activity slowly   Complete by: As directed       Allergies as of 01/05/2022       Reactions   Lisinopril Cough   Penicillins Rash   Has patient had a PCN reaction causing immediate rash, facial/tongue/throat swelling, SOB or lightheadedness with hypotension: Yes Has patient had a PCN reaction causing severe rash involving mucus membranes or skin necrosis: No Has patient had a PCN reaction that required hospitalization No Has patient had a PCN reaction occurring within the last  10 years: No If all of the above answers are "NO", then may proceed with Cephalosporin use.        Medication List     TAKE these medications    Accu-Chek Aviva Plus test strip Generic drug: glucose blood Check glucose bid.   Accu-Chek Softclix Lancets lancets Check glucose 3x per day.   blood glucose meter kit and supplies Kit Dispense based on patient and insurance preference. Use up to four times daily as directed. (FOR ICD-9 250.00, 250.01).   blood glucose meter kit and supplies Kit Dispense based on patient and insurance preference. Use up to test glucose daily.  (FOR ICD - 10)   cefdinir 300 MG capsule Commonly known as: OMNICEF Take 1 capsule (300 mg total) by mouth 2 (two) times daily.   hydrocortisone 2.5 % ointment Apply 1 application topically 2 (two) times daily as needed (rash).   insulin detemir 100 UNIT/ML injection Commonly  known as: Levemir Inject 0.3 mLs (30 Units total) into the skin at bedtime. Inject 25 units SQ qhs. What changed: additional instructions   INSULIN SYRINGE .5CC/31GX5/16" 31G X 5/16" 0.5 ML Misc Use as directed qhs.   losartan 25 MG tablet Commonly known as: COZAAR Take 2 tablets po qd What changed:  how much to take when to take this additional instructions   metFORMIN 500 MG tablet Commonly known as: GLUCOPHAGE TAKE 1 TABLET BY MOUTH 2 TIMES DAILY WITH A MEAL   Pen Needles 31G X 6 MM Misc Use as directed with solostar pen.   VITAMIN C PO Take 1 tablet by mouth 3 (three) times a week.   VITAMIN D3 PO Take 1 tablet by mouth 3 (three) times a week.        Consultations: None  Procedures/Studies: none   CT ABDOMEN PELVIS WO CONTRAST  Result Date: 01/03/2022 CLINICAL DATA:  Nausea, vomiting, chest pain EXAM: CT ABDOMEN AND PELVIS WITHOUT CONTRAST TECHNIQUE: Multidetector CT imaging of the abdomen and pelvis was performed following the standard protocol without IV contrast. RADIATION DOSE REDUCTION: This exam was  performed according to the departmental dose-optimization program which includes automated exposure control, adjustment of the mA and/or kV according to patient size and/or use of iterative reconstruction technique. COMPARISON:  05/25/2020 FINDINGS: Lower chest: No acute abnormality. Hepatobiliary: No focal liver abnormality is seen. Low-attenuation of the liver as can be seen with hepatic steatosis. No gallstones, gallbladder wall thickening, or biliary dilatation. Pancreas: Unremarkable. No pancreatic ductal dilatation or surrounding inflammatory changes. Spleen: Normal in size without focal abnormality. Adrenals/Urinary Tract: Adrenal glands are unremarkable. 13.3 x 13 cm right inferior pole hypodense, fluid attenuating renal mass with small areas of mural calcification likely reflecting a mildly complicated cyst. Multiple other smaller right renal cysts. Multiple small hypodense fluid attenuating left renal masses consistent with cysts unchanged in the prior examination with the largest measuring 17 mm. No obstructive uropathy. Decompressed bladder with contrast filling the bladder. Stomach/Bowel: Stomach is within normal limits. No evidence of bowel wall thickening, distention, or inflammatory changes. Appendix is normal. Diverticulosis without evidence of diverticulitis. Vascular/Lymphatic: No significant vascular findings are present. No enlarged abdominal or pelvic lymph nodes. Reproductive: Prostate is unremarkable. Other: No abdominal wall hernia or abnormality. No abdominopelvic ascites. Musculoskeletal: No acute osseous abnormality. No aggressive osseous lesion. IMPRESSION: 1. No acute abdominal or pelvic pathology. 2. Hepatic steatosis. 3. Multiple bilateral renal cysts with the largest measuring 13.3 x 13 cm arising from the inferior pole of the right kidney. No significant interval change compared with 05/25/2020. Electronically Signed   By: Kathreen Devoid M.D.   On: 01/03/2022 15:31   DG Chest 2  View  Result Date: 01/03/2022 CLINICAL DATA:  Chest pain EXAM: CHEST - 2 VIEW COMPARISON:  Chest x-ray dated 06/10/2012 FINDINGS: Heart size and mediastinal contours are within normal limits. Lungs are clear. No pleural effusion or pneumothorax is seen. Osseous structures about the chest are unremarkable. IMPRESSION: No active cardiopulmonary disease. No evidence of pneumonia or pulmonary edema. Electronically Signed   By: Franki Cabot M.D.   On: 01/03/2022 12:48   CT Angio Chest PE W/Cm &/Or Wo Cm  Result Date: 01/03/2022 CLINICAL DATA:  Chest pain with nausea vomiting. Shortness of breath. EXAM: CT ANGIOGRAPHY CHEST WITH CONTRAST TECHNIQUE: Multidetector CT imaging of the chest was performed using the standard protocol during bolus administration of intravenous contrast. Multiplanar CT image reconstructions and MIPs were obtained to evaluate the vascular anatomy.  RADIATION DOSE REDUCTION: This exam was performed according to the departmental dose-optimization program which includes automated exposure control, adjustment of the mA and/or kV according to patient size and/or use of iterative reconstruction technique. CONTRAST:  56m OMNIPAQUE IOHEXOL 350 MG/ML SOLN COMPARISON:  None. FINDINGS: Cardiovascular: The heart size is normal. No substantial pericardial effusion. No thoracic aortic aneurysm. Bovine arch vessel anatomy noted, normal variant. There is no filling defect within the opacified pulmonary arteries to suggest the presence of an acute pulmonary embolus. Mediastinum/Nodes: No mediastinal lymphadenopathy. There is no hilar lymphadenopathy. The esophagus has normal imaging features. There is no axillary lymphadenopathy. Lungs/Pleura: 4 mm anterior right upper lobe nodule seen on 64/6. Adjacent perifissural nodules in the right lower lobe on 71/6 measure up to 5 mm. 4 mm subpleural nodule noted left lower lobe on 88/6. Several additional scattered tiny pulmonary nodules identified measuring 4 mm or  less in size. No focal airspace consolidation. No pleural effusion. Upper Abdomen: The liver shows diffusely decreased attenuation suggesting fat deposition. 9 mm hypodensity in the dome of the liver is too small to characterize but likely benign. Bilateral renal cysts evident with large exophytic interpolar right renal lesion incompletely visualized but compatible with the large cyst seen on abdomen CT 05/25/2020. Musculoskeletal: No worrisome lytic or sclerotic osseous abnormality. Review of the MIP images confirms the above findings. IMPRESSION: 1. No CT evidence for acute pulmonary embolus. 2. Scattered tiny bilateral pulmonary nodules measuring up to 5 mm in size. These are likely benign. No follow-up needed if patient is low-risk (and has no known or suspected primary neoplasm). Non-contrast chest CT can be considered in 12 months if patient is high-risk. This recommendation follows the consensus statement: Guidelines for Management of Incidental Pulmonary Nodules Detected on CT Images: From the Fleischner Society 2017; Radiology 2017; 284:228-243. 3. Hepatic steatosis. 4. Bilateral renal cysts with large exophytic interpolar right renal lesion incompletely visualized but compatible with the large cyst seen on abdomen CT 05/25/2020. Electronically Signed   By: EMisty StanleyM.D.   On: 01/03/2022 13:27       The results of significant diagnostics from this hospitalization (including imaging, microbiology, ancillary and laboratory) are listed below for reference.     Microbiology: Recent Results (from the past 240 hour(s))  Resp Panel by RT-PCR (Flu A&B, Covid) Nasopharyngeal Swab     Status: None   Collection Time: 01/03/22 12:26 PM   Specimen: Nasopharyngeal Swab; Nasopharyngeal(NP) swabs in vial transport medium  Result Value Ref Range Status   SARS Coronavirus 2 by RT PCR NEGATIVE NEGATIVE Final    Comment: (NOTE) SARS-CoV-2 target nucleic acids are NOT DETECTED.  The SARS-CoV-2 RNA is  generally detectable in upper respiratory specimens during the acute phase of infection. The lowest concentration of SARS-CoV-2 viral copies this assay can detect is 138 copies/mL. A negative result does not preclude SARS-Cov-2 infection and should not be used as the sole basis for treatment or other patient management decisions. A negative result may occur with  improper specimen collection/handling, submission of specimen other than nasopharyngeal swab, presence of viral mutation(s) within the areas targeted by this assay, and inadequate number of viral copies(<138 copies/mL). A negative result must be combined with clinical observations, patient history, and epidemiological information. The expected result is Negative.  Fact Sheet for Patients:  hEntrepreneurPulse.com.au Fact Sheet for Healthcare Providers:  hIncredibleEmployment.be This test is no t yet approved or cleared by the [place] and  has been authorized for detection  and/or diagnosis of SARS-CoV-2 by FDA under an Emergency Use Authorization (EUA). This EUA will remain  in effect (meaning this test can be used) for the duration of the COVID-19 declaration under Section 564(b)(1) of the Act, 21 U.S.C.section 360bbb-3(b)(1), unless the authorization is terminated  or revoked sooner.       Influenza A by PCR NEGATIVE NEGATIVE Final   Influenza B by PCR NEGATIVE NEGATIVE Final    Comment: (NOTE) The Xpert Xpress SARS-CoV-2/FLU/RSV plus assay is intended as an aid in the diagnosis of influenza from Nasopharyngeal swab specimens and should not be used as a sole basis for treatment. Nasal washings and aspirates are unacceptable for Xpert Xpress SARS-CoV-2/FLU/RSV testing.  Fact Sheet for Patients: EntrepreneurPulse.com.au  Fact Sheet for Healthcare Providers: IncredibleEmployment.be  This test is not yet approved or cleared by the [place] FDA and has been authorized for detection and/or diagnosis of SARS-CoV-2 by FDA under an Emergency Use Authorization (EUA). This EUA will remain in effect (meaning this test can be used) for the duration of the COVID-19 declaration under Section 564(b)(1) of the Act, 21 U.S.C. section 360bbb-3(b)(1), unless the authorization is terminated or revoked.  Performed at [facility], [address], [place], [place] [address]   Blood culture (routine x 2)     Status: None (Preliminary result)   Collection Time: 01/03/22  2:31 PM   Specimen: BLOOD RIGHT HAND  Result Value Ref Range Status   Specimen Description BLOOD RIGHT HAND  Final   Special Requests   Final    BOTTLES DRAWN AEROBIC AND ANAEROBIC Blood Culture results may not be optimal due to an excessive volume of blood received in culture bottles   Culture   Final    NO GROWTH 2 DAYS Performed at [facility], [address] [place], [place] [address]    Report Status PENDING  Incomplete  Blood culture (routine x 2)     Status: None (Preliminary result)   Collection Time: 01/03/22  2:31 PM   Specimen: Left Antecubital; Blood  Result Value Ref Range Status   Specimen Description LEFT ANTECUBITAL  Final   Special Requests   Final    BOTTLES DRAWN AEROBIC AND ANAEROBIC Blood Culture results may not be optimal due to an excessive volume of blood received in culture bottles   Culture   Final    NO GROWTH 2 DAYS Performed at [facility], [address], [place], [place] [address]    Report Status PENDING  Incomplete  Urine Culture     Status: None   Collection Time: 01/03/22  3:50 PM   Specimen: Urine, Clean Catch  Result Value Ref Range Status   Specimen Description   Final    URINE, CLEAN CATCH Performed at [facility], [address] [place], [place] [address]    Special Requests   Final    NONE Performed at [facility], [address] [place], [place] [address]    Culture   Final    NO GROWTH Performed at [facility], [place] [address] [place], [place] [address]    Report Status 01/05/2022 FINAL  Final     Labs:  CBC: Recent Labs  Lab 01/03/22 1222 01/04/22 0427 01/05/22 0515  WBC 18.1* 8.1 8.1  NEUTROABS 16.4*  --   --   HGB 18.4* 13.6 13.6  HCT 50.9 37.1* 39.7  MCV 80.2 81.2 80.0  PLT 372 259 261   BMP &GFR Recent Labs  Lab 01/03/22  1222 01/04/22 0427 01/05/22 0515  NA 133* 134* 137  K 4.8 3.8 4.1  CL 101 104 105  CO2 _0 GLUCOSE 184* 146* 143*  BUN _1 CREATININE 1.59* 1.33* 1.31*  CALCIUM 9.6 7.8* 8.2*  MG  --   --  2.0  PHOS  --   --  2.7   Estimated Creatinine Clearance: 68.8 mL/min (A) (by C-G formula based on SCr of 1.31 mg/dL (H)). Liver & Pancreas: Recent Labs  Lab 01/03/22 1222 01/05/22 0515  AST 30  --   ALT 41  --   ALKPHOS 90  --   BILITOT 0.5  --   PROT 9.0*  --   ALBUMIN 4.8 3.1*   No results for input(s): LIPASE, AMYLASE in the last 168 hours. No results for input(s): AMMONIA in the last 168 hours. Diabetic: No results for input(s): HGBA1C in the last 72 hours. Recent Labs  Lab 01/04/22 0706 01/04/22 1123 01/04/22 1606 01/04/22 2151 01/05/22 0757  GLUCAP 171* 131* 149* 151* 130*   Cardiac Enzymes: No results for input(s): CKTOTAL, CKMB, CKMBINDEX, TROPONINI in the last 168 hours. No results for input(s): PROBNP in the last 8760 hours. Coagulation Profile: Recent Labs  Lab 01/03/22 1431  INR 0.9   Thyroid Function Tests: No results for input(s): TSH, T4TOTAL, FREET4, T3FREE, THYROIDAB in the last 72 hours. Lipid Profile: No results for input(s): CHOL, HDL, LDLCALC, TRIG, CHOLHDL, LDLDIRECT in the last 72 hours. Anemia Panel: No results for input(s): VITAMINB12, FOLATE, FERRITIN, TIBC, IRON, RETICCTPCT in the last 72 hours. Urine analysis:    Component Value Date/Time   COLORURINE YELLOW 01/03/2022 1550   APPEARANCEUR CLEAR 01/03/2022 1550   LABSPEC 1.010 01/03/2022 1550   PHURINE 5.5 01/03/2022 1550    GLUCOSEU NEGATIVE 01/03/2022 1550   HGBUR NEGATIVE 01/03/2022 1550   BILIRUBINUR NEGATIVE 01/03/2022 1550   BILIRUBINUR + 12/19/2019 1106   KETONESUR NEGATIVE 01/03/2022 1550   PROTEINUR NEGATIVE 01/03/2022 1550   NITRITE NEGATIVE 01/03/2022 [place] 01/03/2022 1550   Sepsis Labs: Invalid input(s): PROCALCITONIN, LACTICIDVEN   Time coordinating discharge: 45 minutes  SIGNED:  Mercy Riding, MD  [facility] 01/05/2022, 4:08 PM

## 2022-01-05 NOTE — Assessment & Plan Note (Signed)
Resolved

## 2022-01-05 NOTE — Plan of Care (Signed)

## 2022-01-07 ENCOUNTER — Telehealth: Payer: Self-pay

## 2022-01-07 NOTE — Telephone Encounter (Signed)
Transition Care Management Follow-up Telephone Call Date of discharge and from where: 01/05/22 [facility] How have you been since you were released from the hospital? Pt states he is doing well. Any questions or concerns? No  Items Reviewed: Did the pt receive and understand the discharge instructions provided? Yes  Medications obtained and verified? Yes  Other? No  Any new allergies since your discharge? No  Dietary orders reviewed? Yes Do you have support at home? Yes   Home Care and Equipment/Supplies: Were home health services ordered? not applicable If so, what is the name of the agency? N/A  Has the agency set up a time to come to the patient's home? not applicable Were any new equipment or medical supplies ordered?  No What is the name of the medical supply agency? N/A Were you able to get the supplies/equipment? not applicable Do you have any questions related to the use of the equipment or supplies? No  Functional Questionnaire: (I = Independent and D = Dependent) ADLs: I  Bathing/Dressing- I  Meal Prep- I  Eating- I  Maintaining continence- I  Transferring/Ambulation- I  Managing Meds- I  Follow up appointments reviewed:  PCP Hospital f/u appt confirmed? Yes  Scheduled to see Dr. [name] on 01/12/22 @ 8:20 am. [facility] f/u appt confirmed?  N/A   Are transportation arrangements needed? No  If their condition worsens, is the pt aware to call PCP or go to the Emergency Dept.? Yes Was the patient provided with contact information for the PCP's office or ED? Yes Was to pt encouraged to call back with questions or concerns? Yes

## 2022-01-08 LAB — CULTURE, BLOOD (ROUTINE X 2)
Culture: NO GROWTH
Culture: NO GROWTH

## 2022-01-12 ENCOUNTER — Ambulatory Visit (INDEPENDENT_AMBULATORY_CARE_PROVIDER_SITE_OTHER): Payer: 59 | Admitting: Family Medicine

## 2022-01-12 ENCOUNTER — Encounter: Payer: Self-pay | Admitting: Family Medicine

## 2022-01-12 ENCOUNTER — Other Ambulatory Visit: Payer: Self-pay

## 2022-01-12 VITALS — BP 148/78 | HR 97 | Temp 97.9°F | Wt 199.2 lb

## 2022-01-12 DIAGNOSIS — Z125 Encounter for screening for malignant neoplasm of prostate: Secondary | ICD-10-CM

## 2022-01-12 DIAGNOSIS — N1831 Chronic kidney disease, stage 3a: Secondary | ICD-10-CM

## 2022-01-12 DIAGNOSIS — R6510 Systemic inflammatory response syndrome (SIRS) of non-infectious origin without acute organ dysfunction: Secondary | ICD-10-CM

## 2022-01-12 DIAGNOSIS — E782 Mixed hyperlipidemia: Secondary | ICD-10-CM | POA: Diagnosis not present

## 2022-01-12 DIAGNOSIS — E872 Acidosis, unspecified: Secondary | ICD-10-CM

## 2022-01-12 DIAGNOSIS — Z794 Long term (current) use of insulin: Secondary | ICD-10-CM

## 2022-01-12 DIAGNOSIS — E1122 Type 2 diabetes mellitus with diabetic chronic kidney disease: Secondary | ICD-10-CM

## 2022-01-12 NOTE — Assessment & Plan Note (Signed)
Glycemic control improving.  Continue current medications.  Patient has been seeing endocrinology.  I advised him that he can either see endocrinology or me regarding his diabetes.  That choice is up to him.

## 2022-01-12 NOTE — Assessment & Plan Note (Signed)
Now resolved.  Doing well.

## 2022-01-12 NOTE — Assessment & Plan Note (Signed)
Resolved.  Patient is doing well.  Repeating labs today.

## 2022-01-12 NOTE — Patient Instructions (Signed)
Continue your current medications.  Labs today.  Follow up in 3 months.

## 2022-01-12 NOTE — Progress Notes (Signed)
Subjective:  Patient ID: Philip Caldwell, male    DOB: November 16, 1966  Age: 56 y.o. MRN: [id number]  CC: Chief Complaint  Patient presents with   Hospitalization Follow-up    [facility] from 01/03/22-01/05/22; vomiting, chest pain, WBC elevated, shortness of breath    HPI:  56 year old male with hypertension, uncontrolled type 2 diabetes, chronic kidney disease, hyperlipidemia presents for hospital follow-up.  Hospital course and discharge summary reviewed.  In summary: Patient admitted from 2/5 to 2/7.  Presented with nausea, vomiting, and abdominal pain.  Also had chest pain.  Initially tachycardic and tachypneic.  Found to have leukocytosis and lactic acidosis.  He was placed empirically on broad-spectrum antibiotics given that he met SIRS criteria.  Further work-up was unrevealing regarding an infectious source.  He improved with IV fluids and supportive care.  Patient states that he is currently feeling well.  Symptoms have fully resolved.  Denies fever.  Denies nausea, vomiting.  He is back on his home medication.  States that his blood glucose control is improving.  Fastings in the 130s to 140s.  Patient Active Problem List   Diagnosis Date Noted   CKD (chronic kidney disease) stage 3, GFR 30-59 ml/min (HCC) 01/04/2022   Lactic acidosis 01/04/2022   SIRS (systemic inflammatory response syndrome) ([place]) 01/03/2022   Mixed hyperlipidemia 12/28/2021   Type 2 diabetes mellitus with diabetic chronic kidney disease ([place]) 11/06/2018   Essential hypertension, benign 10/14/2014    Social Hx   Social History   Socioeconomic History   Marital status: Married    Spouse name: Not on file   Number of children: Not on file   Years of education: Not on file   Highest education level: Not on file  Occupational History   Not on file  Tobacco Use   Smoking status: Never   Smokeless tobacco: Never  Vaping Use   Vaping Use: Never used  Substance and Sexual Activity   Alcohol use: No   Drug  use: No   Sexual activity: Yes    Birth control/protection: None  Other Topics Concern   Not on file  Social History Narrative   Not on file   Social Determinants of Health   Financial Resource Strain: Not on file  Food Insecurity: Not on file  Transportation Needs: Not on file  Physical Activity: Not on file  Stress: Not on file  Social Connections: Not on file    Review of Systems Per HPI  Objective:  BP (!) 148/78    Pulse 97    Temp 97.9 F (36.6 C)    Wt 199 lb 3.2 oz (90.4 kg)    SpO2 98%    BMI 29.42 kg/m   BP/Weight 01/12/2022 07/06/4165 0/04/3015  Systolic BP 010 932 -  Diastolic BP 78 85 -  Wt. (Lbs) 199.2 - 197  BMI 29.42 - 29.09    Physical Exam Vitals and nursing note reviewed.  Constitutional:      General: He is not in acute distress.    Appearance: Normal appearance. He is not ill-appearing.  HENT:     Head: Normocephalic and atraumatic.  Eyes:     General:        Right eye: No discharge.        Left eye: No discharge.     Conjunctiva/sclera: Conjunctivae normal.  Cardiovascular:     Rate and Rhythm: Normal rate and regular rhythm.  Pulmonary:     Effort: Pulmonary effort is normal.  Breath sounds: Normal breath sounds. No wheezing, rhonchi or rales.  Abdominal:     General: There is no distension.     Palpations: Abdomen is soft.     Tenderness: There is no abdominal tenderness.  Neurological:     Mental Status: He is alert.  Psychiatric:        Mood and Affect: Mood normal.        Behavior: Behavior normal.    Lab Results  Component Value Date   WBC 8.1 01/05/2022   HGB 13.6 01/05/2022   HCT 39.7 01/05/2022   PLT 261 01/05/2022   GLUCOSE 143 (H) 01/05/2022   CHOL 202 (H) 12/10/2021   TRIG 202 (H) 12/10/2021   HDL 42 12/10/2021   LDLCALC 124 (H) 12/10/2021   ALT 41 01/03/2022   AST 30 01/03/2022   NA 137 01/05/2022   K 4.1 01/05/2022   CL 105 01/05/2022   CREATININE 1.31 (H) 01/05/2022   BUN 12 01/05/2022   CO2 26  01/05/2022   TSH 2.440 12/10/2021   PSA 1.12 09/09/2014   INR 0.9 01/03/2022   HGBA1C 8.0 (A) 12/28/2021     Assessment & Plan:   Problem List Items Addressed This Visit       Endocrine   Type 2 diabetes mellitus with diabetic chronic kidney disease ([place])    Glycemic control improving.  Continue current medications.  Patient has been seeing endocrinology.  I advised him that he can either see endocrinology or me regarding his diabetes.  That choice is up to him.      Relevant Orders   CBC   CMP14+EGFR     Other   SIRS (systemic inflammatory response syndrome) (HCC) - Primary    Resolved.  Patient is doing well.  Repeating labs today.      Mixed hyperlipidemia   Relevant Orders   Lipid panel   Lactic acidosis    Now resolved.  Doing well.      Other Visit Diagnoses     Prostate cancer screening       Relevant Orders   PSA      Follow-up:  Return in about 3 months (around 04/11/2022).  [place]

## 2022-01-13 LAB — CMP14+EGFR
ALT: 31 IU/L (ref 0–44)
AST: 19 IU/L (ref 0–40)
Albumin/Globulin Ratio: 1.6 (ref 1.2–2.2)
Albumin: 4.5 g/dL (ref 3.8–4.9)
Alkaline Phosphatase: 99 IU/L (ref 44–121)
BUN/Creatinine Ratio: 8 — ABNORMAL LOW (ref 9–20)
BUN: 12 mg/dL (ref 6–24)
Bilirubin Total: <0.2 mg/dL (ref 0.0–1.2)
CO2: 22 mmol/L (ref 20–29)
Calcium: 9.8 mg/dL (ref 8.7–10.2)
Chloride: 99 mmol/L (ref 96–106)
Creatinine, Ser: 1.43 mg/dL — ABNORMAL HIGH (ref 0.76–1.27)
Globulin, Total: 2.9 g/dL (ref 1.5–4.5)
Glucose: 150 mg/dL — ABNORMAL HIGH (ref 70–99)
Potassium: 5.2 mmol/L (ref 3.5–5.2)
Sodium: 139 mmol/L (ref 134–144)
Total Protein: 7.4 g/dL (ref 6.0–8.5)
eGFR: 57 mL/min/{1.73_m2} — ABNORMAL LOW (ref >59)

## 2022-01-13 LAB — LIPID PANEL
Chol/HDL Ratio: 4.7 ratio (ref 0.0–5.0)
Cholesterol, Total: 178 mg/dL (ref 100–199)
HDL: 38 mg/dL — ABNORMAL LOW (ref >39)
LDL Chol Calc (NIH): 93 mg/dL (ref 0–99)
Triglycerides: 282 mg/dL — ABNORMAL HIGH (ref 0–149)
VLDL Cholesterol Cal: 47 mg/dL — ABNORMAL HIGH (ref 5–40)

## 2022-01-13 LAB — CBC
Hematocrit: 47.2 % (ref 37.5–51.0)
Hemoglobin: 16.2 g/dL (ref 13.0–17.7)
MCH: 28 pg (ref 26.6–33.0)
MCHC: 34.3 g/dL (ref 31.5–35.7)
MCV: 82 fL (ref 79–97)
Platelets: 437 10*3/uL (ref 150–450)
RBC: 5.79 x10E6/uL (ref 4.14–5.80)
RDW: 15.1 % (ref 11.6–15.4)
WBC: 9.3 10*3/uL (ref 3.4–10.8)

## 2022-01-13 LAB — PSA: Prostate Specific Ag, Serum: 2.2 ng/mL (ref 0.0–4.0)

## 2022-01-15 ENCOUNTER — Other Ambulatory Visit: Payer: Self-pay | Admitting: Family Medicine

## 2022-01-15 MED ORDER — ICOSAPENT ETHYL 1 G PO CAPS: 2.0000 g | ORAL_CAPSULE | Freq: Two times a day (BID) | ORAL | 1 refills | Status: DC

## 2022-01-18 ENCOUNTER — Other Ambulatory Visit: Payer: Self-pay | Admitting: Family Medicine

## 2022-01-18 ENCOUNTER — Telehealth: Payer: Self-pay | Admitting: *Deleted

## 2022-01-18 MED ORDER — ROSUVASTATIN CALCIUM 10 MG PO TABS: 10.0000 mg | ORAL_TABLET | Freq: Every day | ORAL | 3 refills | Status: DC

## 2022-01-18 NOTE — Telephone Encounter (Signed)
Prior authorization for Vascepa 1gm has been denied[employer] states the medication only covered when used in conjunction with a statin and pre treatment triglycerides greater than 500

## 2022-01-18 NOTE — Telephone Encounter (Signed)
[name], [place], DO    Okay. Sent in Crestor.

## 2022-01-18 NOTE — Telephone Encounter (Signed)
Patient notified and verbalized understanding. 

## 2022-01-22 ENCOUNTER — Other Ambulatory Visit: Payer: Self-pay | Admitting: *Deleted

## 2022-01-22 MED ORDER — LOSARTAN POTASSIUM 25 MG PO TABS: ORAL_TABLET | ORAL | 0 refills | Status: DC

## 2022-02-18 ENCOUNTER — Telehealth: Payer: Self-pay | Admitting: "Endocrinology

## 2022-02-18 NOTE — Telephone Encounter (Signed)
Pt called concerning readings being high. He is testing 2x a day.  ? ?3/20 173 -- 221  ? ?3/21 173 -- 231 ? ?3/22 234 -- 219 ? ?3/23 240 ?

## 2022-02-18 NOTE — Telephone Encounter (Signed)
Informed pt .

## 2022-03-10 ENCOUNTER — Other Ambulatory Visit: Payer: Self-pay | Admitting: "Endocrinology

## 2022-03-10 ENCOUNTER — Other Ambulatory Visit: Payer: Self-pay | Admitting: Family Medicine

## 2022-03-10 DIAGNOSIS — E1165 Type 2 diabetes mellitus with hyperglycemia: Secondary | ICD-10-CM

## 2022-03-16 ENCOUNTER — Telehealth: Payer: Self-pay

## 2022-03-16 NOTE — Telephone Encounter (Signed)
Patient states he has decided to stay with Dr [name] for diabetic care, patient asks if there is any other labs he would like to add prior to upcoming appt. in May, please advise ?

## 2022-03-17 NOTE — Telephone Encounter (Signed)
Patient has been informed per Dr's notes and recommendations.  

## 2022-03-29 LAB — HM DIABETES EYE EXAM

## 2022-04-13 ENCOUNTER — Ambulatory Visit (INDEPENDENT_AMBULATORY_CARE_PROVIDER_SITE_OTHER): Payer: 59 | Admitting: Family Medicine

## 2022-04-13 VITALS — BP 126/76 | HR 80 | Temp 97.2°F | Ht 69.0 in | Wt 192.0 lb

## 2022-04-13 DIAGNOSIS — I10 Essential (primary) hypertension: Secondary | ICD-10-CM | POA: Diagnosis not present

## 2022-04-13 DIAGNOSIS — E782 Mixed hyperlipidemia: Secondary | ICD-10-CM | POA: Diagnosis not present

## 2022-04-13 DIAGNOSIS — E1122 Type 2 diabetes mellitus with diabetic chronic kidney disease: Secondary | ICD-10-CM | POA: Diagnosis not present

## 2022-04-13 DIAGNOSIS — Z794 Long term (current) use of insulin: Secondary | ICD-10-CM

## 2022-04-13 DIAGNOSIS — N1831 Chronic kidney disease, stage 3a: Secondary | ICD-10-CM

## 2022-04-13 MED ORDER — ROSUVASTATIN CALCIUM 10 MG PO TABS
10.0000 mg | ORAL_TABLET | Freq: Every day | ORAL | 3 refills | Status: DC
Start: 2022-04-13 — End: 2023-04-11

## 2022-04-13 NOTE — Progress Notes (Signed)
? ?Subjective:  ?Patient ID: Philip Caldwell, male    DOB: [birth year]  Age: 56 y.o. MRN: [id number] ? ?CC: ?Chief Complaint  ?Patient presents with  ? 3 month follow up   ? Diabetes  ?  Asked to see Dr [name] for DM 2 instead of specialist  ? Hyperlipidemia  ? ? ?HPI: ? ?56 year old male with hypertension, type 2 diabetes, chronic kidney disease, hyperlipidemia presents for follow-up. ? ?Patient insulin was recently increased to 40 units nightly.  He remains on metformin 500 mg twice daily.  Reports improvement in blood sugars.  He is watching his diet more carefully.  Needs A1c today. ? ?Patient's hyperlipidemia is treated with Crestor and Vascepa.  Needs lipid panel to reassess today. ? ?Hypertension is at goal on losartan. ? ?Patient states that he is doing well.  Denies chest pain or shortness of breath. ? ?Patient Active Problem List  ? Diagnosis Date Noted  ? CKD (chronic kidney disease) stage 3, GFR 30-59 ml/min (HCC) 01/04/2022  ? Mixed hyperlipidemia 12/28/2021  ? Type 2 diabetes mellitus with diabetic chronic kidney disease ([place]) 11/06/2018  ? Essential hypertension, benign 10/14/2014  ? ? ?Social Hx   ?Social History  ? ?Socioeconomic History  ? Marital status: Married  ?  Spouse name: Not on file  ? Number of children: Not on file  ? Years of education: Not on file  ? Highest education level: Not on file  ?Occupational History  ? Not on file  ?Tobacco Use  ? Smoking status: Never  ? Smokeless tobacco: Never  ?Vaping Use  ? Vaping Use: Never used  ?Substance and Sexual Activity  ? Alcohol use: No  ? Drug use: No  ? Sexual activity: Yes  ?  Birth control/protection: None  ?Other Topics Concern  ? Not on file  ?Social History Narrative  ? Not on file  ? ?Social Determinants of Health  ? ?Financial Resource Strain: Not on file  ?Food Insecurity: Not on file  ?Transportation Needs: Not on file  ?Physical Activity: Not on file  ?Stress: Not on file  ?Social Connections: Not on file  ? ? ?Review of Systems ?Per  HPI ? ?Objective:  ?BP 126/76   Pulse 80   Temp (!) 97.2 ?F (36.2 ?C)   Ht 5' 9" (1.753 m)   Wt 192 lb (87.1 kg)   SpO2 98%   BMI 28.35 kg/m?  ? ? ?  04/13/2022  ?  8:12 AM 01/12/2022  ?  8:28 AM 01/05/2022  ?  5:57 AM  ?BP/Weight  ?Systolic BP 937 169 678  ?Diastolic BP 76 78 85  ?Wt. (Lbs) 192 199.2   ?BMI 28.35 kg/m2 29.42 kg/m2   ? ? ?Physical Exam ?Vitals and nursing note reviewed.  ?Constitutional:   ?   Appearance: Normal appearance.  ?HENT:  ?   Head: Normocephalic and atraumatic.  ?Eyes:  ?   General:     ?   Right eye: No discharge.     ?   Left eye: No discharge.  ?   Conjunctiva/sclera: Conjunctivae normal.  ?Cardiovascular:  ?   Rate and Rhythm: Normal rate and regular rhythm.  ?Pulmonary:  ?   Effort: Pulmonary effort is normal.  ?   Breath sounds: Normal breath sounds. No wheezing, rhonchi or rales.  ?Neurological:  ?   Mental Status: He is alert.  ?Psychiatric:     ?   Mood and Affect: Mood normal.     ?  Behavior: Behavior normal.  ?Diabetic foot exam performed today.  See quality metrics section ? ?Lab Results  ?Component Value Date  ? WBC 9.3 01/12/2022  ? HGB 16.2 01/12/2022  ? HCT 47.2 01/12/2022  ? PLT 437 01/12/2022  ? GLUCOSE 150 (H) 01/12/2022  ? CHOL 178 01/12/2022  ? TRIG 282 (H) 01/12/2022  ? HDL 38 (L) 01/12/2022  ? [place] 93 01/12/2022  ? ALT 31 01/12/2022  ? AST 19 01/12/2022  ? NA 139 01/12/2022  ? K 5.2 01/12/2022  ? CL 99 01/12/2022  ? CREATININE 1.43 (H) 01/12/2022  ? BUN 12 01/12/2022  ? CO2 22 01/12/2022  ? TSH 2.440 12/10/2021  ? PSA 1.12 09/09/2014  ? INR 0.9 01/03/2022  ? HGBA1C 8.0 (A) 12/28/2021  ? ? ? ?Assessment & Plan:  ? ?Problem List Items Addressed This Visit   ? ?  ? Cardiovascular and Mediastinum  ? Essential hypertension, benign  ?  At goal.  Continue losartan. ? ?  ?  ? Relevant Medications  ? rosuvastatin (CRESTOR) 10 MG tablet  ?  ? Endocrine  ? Type 2 diabetes mellitus with diabetic chronic kidney disease ([place]) - Primary  ?  A1c today.  Continue current  dosing of insulin and metformin. ? ?  ?  ? Relevant Medications  ? rosuvastatin (CRESTOR) 10 MG tablet  ? Other Relevant Orders  ? Hemoglobin A1c  ?  ? Genitourinary  ? CKD (chronic kidney disease) stage 3, GFR 30-59 ml/min (HCC)  ?  Metabolic panel today. ? ?  ?  ? Relevant Orders  ? CBC  ? CMP14+EGFR  ?  ? Other  ? Mixed hyperlipidemia  ?  Reassessing lipids today with lipid panel.  Continue Crestor and Vascepa. ? ?  ?  ? Relevant Medications  ? rosuvastatin (CRESTOR) 10 MG tablet  ? Other Relevant Orders  ? Lipid panel  ? ? ?Meds ordered this encounter  ?Medications  ? rosuvastatin (CRESTOR) 10 MG tablet  ?  Sig: Take 1 tablet (10 mg total) by mouth daily.  ?  Dispense:  90 tablet  ?  Refill:  3  ? ? ?Follow-up:  Return in about 3 months (around 07/14/2022). ? ?Thersa Salt DO ?[place] ? ?

## 2022-04-13 NOTE — Assessment & Plan Note (Signed)
Reassessing lipids today with lipid panel.  Continue Crestor and Vascepa. ?

## 2022-04-13 NOTE — Assessment & Plan Note (Signed)
A1c today.  Continue current dosing of insulin and metformin. ?

## 2022-04-13 NOTE — Assessment & Plan Note (Signed)
At goal.  Continue losartan. 

## 2022-04-13 NOTE — Assessment & Plan Note (Signed)
Metabolic panel today. ?

## 2022-04-13 NOTE — Patient Instructions (Signed)
Labs today. ? ?Continue your medications. ? ?Follow up in 3 months. ? ?Take care ? ?Dr. [name]  ?

## 2022-04-14 ENCOUNTER — Encounter: Payer: Self-pay | Admitting: Family Medicine

## 2022-04-14 LAB — CMP14+EGFR
ALT: 20 IU/L (ref 0–44)
AST: 18 IU/L (ref 0–40)
Albumin/Globulin Ratio: 1.4 (ref 1.2–2.2)
Albumin: 4.2 g/dL (ref 3.8–4.9)
Alkaline Phosphatase: 90 IU/L (ref 44–121)
BUN/Creatinine Ratio: 12 (ref 9–20)
BUN: 15 mg/dL (ref 6–24)
Bilirubin Total: 0.5 mg/dL (ref 0.0–1.2)
CO2: 22 mmol/L (ref 20–29)
Calcium: 9.6 mg/dL (ref 8.7–10.2)
Chloride: 101 mmol/L (ref 96–106)
Creatinine, Ser: 1.24 mg/dL (ref 0.76–1.27)
Globulin, Total: 3 g/dL (ref 1.5–4.5)
Glucose: 119 mg/dL — ABNORMAL HIGH (ref 70–99)
Potassium: 4.4 mmol/L (ref 3.5–5.2)
Sodium: 138 mmol/L (ref 134–144)
Total Protein: 7.2 g/dL (ref 6.0–8.5)
eGFR: 68 mL/min/{1.73_m2} (ref >59)

## 2022-04-14 LAB — CBC
Hematocrit: 44.4 % (ref 37.5–51.0)
Hemoglobin: 15.2 g/dL (ref 13.0–17.7)
MCH: 27.7 pg (ref 26.6–33.0)
MCHC: 34.2 g/dL (ref 31.5–35.7)
MCV: 81 fL (ref 79–97)
Platelets: 338 10*3/uL (ref 150–450)
RBC: 5.49 x10E6/uL (ref 4.14–5.80)
RDW: 14.8 % (ref 11.6–15.4)
WBC: 7.7 10*3/uL (ref 3.4–10.8)

## 2022-04-14 LAB — LIPID PANEL
Chol/HDL Ratio: 2.7 ratio (ref 0.0–5.0)
Cholesterol, Total: 112 mg/dL (ref 100–199)
HDL: 41 mg/dL (ref >39)
LDL Chol Calc (NIH): 48 mg/dL (ref 0–99)
Triglycerides: 129 mg/dL (ref 0–149)
VLDL Cholesterol Cal: 23 mg/dL (ref 5–40)

## 2022-04-14 LAB — HEMOGLOBIN A1C
Est. average glucose Bld gHb Est-mCnc: 223 mg/dL
Hgb A1c MFr Bld: 9.4 % — ABNORMAL HIGH (ref 4.8–5.6)

## 2022-04-28 ENCOUNTER — Ambulatory Visit: Payer: 59 | Admitting: "Endocrinology

## 2022-04-28 ENCOUNTER — Ambulatory Visit: Payer: 59 | Admitting: Nutrition

## 2022-05-05 MED ORDER — LOSARTAN POTASSIUM 25 MG PO TABS: ORAL_TABLET | ORAL | 0 refills | Status: DC

## 2022-05-10 ENCOUNTER — Encounter: Payer: Self-pay | Admitting: Family Medicine

## 2022-05-19 ENCOUNTER — Other Ambulatory Visit: Payer: Self-pay | Admitting: "Endocrinology

## 2022-05-20 MED ORDER — METFORMIN HCL 1000 MG PO TABS: ORAL_TABLET | ORAL | 1 refills | Status: DC

## 2022-05-21 ENCOUNTER — Other Ambulatory Visit: Payer: Self-pay | Admitting: "Endocrinology

## 2022-05-28 ENCOUNTER — Encounter: Payer: Self-pay | Admitting: Family Medicine

## 2022-05-28 ENCOUNTER — Other Ambulatory Visit: Payer: Self-pay | Admitting: Family Medicine

## 2022-05-28 DIAGNOSIS — E1165 Type 2 diabetes mellitus with hyperglycemia: Secondary | ICD-10-CM

## 2022-06-06 ENCOUNTER — Other Ambulatory Visit: Payer: Self-pay | Admitting: "Endocrinology

## 2022-06-06 DIAGNOSIS — E1165 Type 2 diabetes mellitus with hyperglycemia: Secondary | ICD-10-CM

## 2022-06-23 ENCOUNTER — Other Ambulatory Visit: Payer: Self-pay | Admitting: Family Medicine

## 2022-06-23 ENCOUNTER — Other Ambulatory Visit: Payer: Self-pay | Admitting: "Endocrinology

## 2022-06-23 DIAGNOSIS — E1165 Type 2 diabetes mellitus with hyperglycemia: Secondary | ICD-10-CM

## 2022-06-23 MED ORDER — INSULIN DETEMIR 100 UNIT/ML ~~LOC~~ SOLN: 40.0000 [IU] | Freq: Every day | SUBCUTANEOUS | 3 refills | Status: DC

## 2022-06-23 NOTE — Telephone Encounter (Signed)
Patient called back and stated he no longer sees Dr Dorris Fetch for his diabetic care and saw Dr [name] for a diabetic check in May and would like to know if he can refill his insulin to [facility]. Dr Liliane Channel office will not refill without an appt and he does not want to go back their for diabetic care- please advise

## 2022-07-14 ENCOUNTER — Ambulatory Visit: Payer: Self-pay | Admitting: Family Medicine

## 2022-07-30 ENCOUNTER — Other Ambulatory Visit: Payer: Self-pay | Admitting: Family Medicine

## 2022-09-16 ENCOUNTER — Encounter: Payer: Self-pay | Admitting: Emergency Medicine

## 2022-09-16 ENCOUNTER — Ambulatory Visit
Admission: EM | Admit: 2022-09-16 | Discharge: 2022-09-16 | Disposition: A | Discharge status: Home / Self Care | Payer: 59 | Attending: Nurse Practitioner | Admitting: Nurse Practitioner

## 2022-09-16 ENCOUNTER — Ambulatory Visit (INDEPENDENT_AMBULATORY_CARE_PROVIDER_SITE_OTHER): Payer: 59

## 2022-09-16 ENCOUNTER — Telehealth: Payer: Self-pay | Admitting: Family Medicine

## 2022-09-16 DIAGNOSIS — I10 Essential (primary) hypertension: Secondary | ICD-10-CM

## 2022-09-16 DIAGNOSIS — R059 Cough, unspecified: Secondary | ICD-10-CM

## 2022-09-16 DIAGNOSIS — E782 Mixed hyperlipidemia: Secondary | ICD-10-CM

## 2022-09-16 DIAGNOSIS — R051 Acute cough: Secondary | ICD-10-CM | POA: Diagnosis not present

## 2022-09-16 DIAGNOSIS — N1831 Chronic kidney disease, stage 3a: Secondary | ICD-10-CM

## 2022-09-16 DIAGNOSIS — R058 Other specified cough: Secondary | ICD-10-CM | POA: Diagnosis not present

## 2022-09-16 DIAGNOSIS — E1122 Type 2 diabetes mellitus with diabetic chronic kidney disease: Secondary | ICD-10-CM

## 2022-09-16 DIAGNOSIS — Z79899 Other long term (current) drug therapy: Secondary | ICD-10-CM

## 2022-09-16 MED ORDER — BENZONATATE 100 MG PO CAPS: 100.0000 mg | ORAL_CAPSULE | Freq: Three times a day (TID) | ORAL | 0 refills | Status: DC | PRN

## 2022-09-16 NOTE — ED Provider Notes (Signed)
[facility]    CSN: [id number] Arrival date & time: 09/16/22  1043      History   Chief Complaint No chief complaint on file.   HPI Philip Caldwell is a 56 y.o. male.   Patient presents today for 2-1/2 weeks of productive cough.  Reports he is coughing up thick, clear/white sputum.  He endorses chest congestion, slight headache, and fatigue.  No fever, chest pain or shortness of breath, wheezing, chest tightness, nasal congestion, runny nose, sore throat, sinus pressure, ear pain or drainage, abdominal pain, nausea/vomiting, diarrhea, decreased appetite, new rash.  Initially, he took Robitussin for the cough which helped temporarily.  Reports his cough is overall improving, however is not fully gone.  Medical history significant for essential hypertension, type 2 diabetes, hyperlipidemia, chronic kidney disease.  Patient denies history of chronic lung disease like asthma or COPD.  Reports he has had pneumonia, however that was a few years ago.    Past Medical History:  Diagnosis Date   A-fib ([place])    Arm pain, central    with intermittent numbness   Diabetes mellitus without complication ([place])    Hypertension    Neck pain    Renal disorder    Tendonitis     Patient Active Problem List   Diagnosis Date Noted   CKD (chronic kidney disease) stage 3, GFR 30-59 ml/min ([place]) 01/04/2022   Mixed hyperlipidemia 12/28/2021   Type 2 diabetes mellitus with diabetic chronic kidney disease ([place]) 11/06/2018   Essential hypertension, benign 10/14/2014    Past Surgical History:  Procedure Laterality Date   COLONOSCOPY N/A 10/08/2015   Procedure: COLONOSCOPY;  Surgeon: Rogene Houston, MD;  Location: [facility];  Service: Endoscopy;  Laterality: N/A;  [place] Left 05/25/2020   Procedure: CYSTOSCOPY WITH STENT PLACEMENT retrograde pylegram;  Surgeon: Lucas Mallow, MD;  Location: [facility];  Service: Urology;  Laterality: Left;   NERVE  SURGERY         Home Medications    Prior to Admission medications   Medication Sig Start Date End Date Taking? Authorizing Provider  benzonatate (TESSALON) 100 MG capsule Take 1 capsule (100 mg total) by mouth 3 (three) times daily as needed for cough. Do not take with alcohol or while driving or operating heavy machinery.  May cause drowsiness. 09/16/22  Yes Eulogio Bear, NP  Accu-Chek Softclix Lancets lancets Check glucose 3x per day. 11/18/20   Elvia Collum M, DO  Ascorbic Acid (VITAMIN C PO) Take 1 tablet by mouth 3 (three) times a week.    [provider]  Cholecalciferol (VITAMIN D3 PO) Take 1 tablet by mouth 3 (three) times a week.    [provider]  glucose blood (ACCU-CHEK AVIVA PLUS) test strip CHECK GLUCOSE TWICE A DAY 05/28/22   Thersa Salt G, DO  hydrocortisone 2.5 % ointment Apply 1 application topically 2 (two) times daily as needed (rash). 10/08/21   Kathyrn Drown, MD  icosapent Ethyl (VASCEPA) 1 g capsule Take 2 capsules (2 g total) by mouth 2 (two) times daily. 01/15/22 04/15/22  Coral Spikes, DO  insulin detemir (LEVEMIR) 100 UNIT/ML injection Inject 0.4 mLs (40 Units total) into the skin at bedtime. 06/23/22   Coral Spikes, DO  Insulin Pen Needle (PEN NEEDLES) 31G X 6 MM MISC Use as directed with solostar pen. 11/17/20   Lovena Le, Malena M, DO  Insulin Syringe-Needle U-100 (INSULIN SYRINGE .5CC/31GX5/16") 31G  X 5/16" 0.5 ML MISC Use as directed qhs. 12/28/21   Nida, Marella Chimes, MD  LEVEMIR 100 UNIT/ML injection INJECT 0.4 MLS (40 UNITS TOTAL) INTO THE SKIN AT BEDTIME 06/25/22   Cassandria Anger, MD  losartan (COZAAR) 25 MG tablet TAKE 2 TABLETS BY MOUTH EVERY DAY 07/30/22   Coral Spikes, DO  metFORMIN (GLUCOPHAGE) 1000 MG tablet Take 1 tablet by mouth twice a day 05/20/22   Coral Spikes, DO  rosuvastatin (CRESTOR) 10 MG tablet Take 1 tablet (10 mg total) by mouth daily. 04/13/22   Coral Spikes, DO    Family History Family History   Problem Relation Age of Onset   Thyroid disease Mother    Dementia Mother    Hypertension Father    Cancer Father    Heart attack Brother     Social History Social History   Tobacco Use   Smoking status: Never   Smokeless tobacco: Never  Vaping Use   Vaping Use: Never used  Substance Use Topics   Alcohol use: No   Drug use: No     Allergies   Lisinopril and Penicillins   Review of Systems Review of Systems Per HPI  Physical Exam Triage Vital Signs ED Triage Vitals  Enc Vitals Group     BP 09/16/22 1048 123/71     Pulse Rate 09/16/22 1048 (!) 113     Resp 09/16/22 1048 18     Temp 09/16/22 1048 98.7 F (37.1 C)     Temp Source 09/16/22 1048 Oral     SpO2 09/16/22 1048 96 %     Weight --      Height --      Head Circumference --      Peak Flow --      Pain Score 09/16/22 1049 0     Pain Loc --      Pain Edu? --      Excl. in [place]? --    No data found.  Updated Vital Signs BP 123/71 (BP Location: Right Arm)   Pulse (!) 113   Temp 98.7 F (37.1 C) (Oral)   Resp 18   SpO2 96%   Visual Acuity Right Eye Distance:   Left Eye Distance:   Bilateral Distance:    Right Eye Near:   Left Eye Near:    Bilateral Near:     Physical Exam Vitals and nursing note reviewed.  Constitutional:      General: He is not in acute distress.    Appearance: Normal appearance. He is not ill-appearing or toxic-appearing.  HENT:     Head: Normocephalic and atraumatic.     Right Ear: Tympanic membrane, ear canal and external ear normal.     Left Ear: Tympanic membrane, ear canal and external ear normal.     Nose: No congestion or rhinorrhea.     Mouth/Throat:     Mouth: Mucous membranes are moist.     Pharynx: Oropharynx is clear. No oropharyngeal exudate or posterior oropharyngeal erythema.  Eyes:     General: No scleral icterus.    Extraocular Movements: Extraocular movements intact.  Cardiovascular:     Rate and Rhythm: Normal rate and regular rhythm.   Pulmonary:     Effort: Pulmonary effort is normal. No respiratory distress.     Breath sounds: Normal breath sounds. No wheezing, rhonchi or rales.  Abdominal:     General: Abdomen is flat. Bowel sounds are normal. There is no distension.  Palpations: Abdomen is soft.     Tenderness: There is no abdominal tenderness.  Musculoskeletal:     Cervical back: Normal range of motion and neck supple.  Lymphadenopathy:     Cervical: No cervical adenopathy.  Skin:    General: Skin is warm and dry.     Coloration: Skin is not jaundiced or pale.     Findings: No erythema or rash.  Neurological:     Mental Status: He is alert and oriented to person, place, and time.  Psychiatric:        Behavior: Behavior is cooperative.      UC Treatments / Results  Labs (all labs ordered are listed, but only abnormal results are displayed) Labs Reviewed - No data to display  EKG   Radiology DG Chest 2 View  Result Date: 09/16/2022 CLINICAL DATA:  Cough x2 weeks EXAM: CHEST - 2 VIEW COMPARISON:  01/03/2022 FINDINGS: Cardiac size is within normal limits. There are no signs of pulmonary edema or focal pulmonary consolidation. There is no pleural effusion or pneumothorax. IMPRESSION: There are no signs of pulmonary edema or focal pulmonary consolidation. Electronically Signed   By: Elmer Picker M.D.   On: 09/16/2022 11:11    Procedures Procedures (including critical care time)  Medications Ordered in UC Medications - No data to display  Initial Impression / Assessment and Plan / UC Course  I have reviewed the triage vital signs and the nursing notes.  Pertinent labs & imaging results that were available during my care of the patient were reviewed by me and considered in my medical decision making (see chart for details).   Patient is well-appearing, normotensive, afebrile, not tachypneic, oxygenating well on room air.  He is slightly tachycardic today, which is above his baseline.  I  suspect this is secondary to acute illness.  Acute cough Post-viral cough syndrome Chest x-ray today is negative for acute cardiopulmonary process Supportive care discussed Start Tessalon Perles and Mucinex ER and return precautions discussed    The patient was given the opportunity to ask questions.  All questions answered to their satisfaction.  The patient is in agreement to this plan.    Final Clinical Impressions(s) / UC Diagnoses   Final diagnoses:  Acute cough  Post-viral cough syndrome     Discharge Instructions      Chest x-ray does not show pneumonia today.  I suspect your symptoms are related to postviral cough.  Start the cough suppressant as needed at nighttime; if you are coughing stuff up during the day, we want you to cough this up.  Start guaifenesin 600 mg twice daily.        ED Prescriptions     Medication Sig Dispense Auth. Provider   benzonatate (TESSALON) 100 MG capsule Take 1 capsule (100 mg total) by mouth 3 (three) times daily as needed for cough. Do not take with alcohol or while driving or operating heavy machinery.  May cause drowsiness. 21 capsule Eulogio Bear, NP      PDMP not reviewed this encounter.   Eulogio Bear, NP 09/16/22 1202

## 2022-09-16 NOTE — Discharge Instructions (Signed)
Chest x-ray does not show pneumonia today.  I suspect your symptoms are related to postviral cough.  Start the cough suppressant as needed at nighttime; if you are coughing stuff up during the day, we want you to cough this up.  Start guaifenesin 600 mg twice daily.

## 2022-09-16 NOTE — Telephone Encounter (Signed)
Patient has appointment 11/6 for diabetes follow up needing labs done

## 2022-09-16 NOTE — ED Triage Notes (Signed)
Cough x 2.5 weeks.  Has tried over the counter cough medications without relief.  Coughing up white sputum.

## 2022-09-16 NOTE — Telephone Encounter (Signed)
Last labs 04/13/22: CBC, CMP, HgbA1c, Lipid

## 2022-09-20 NOTE — Telephone Encounter (Signed)
Blood work ordered in Epic. Patient notified. 

## 2022-09-20 NOTE — Telephone Encounter (Signed)
CBC, CMP, Lipid, A1C, Urine Microalbumin.  Please order.   Thank you   Dr. [name]

## 2022-09-22 ENCOUNTER — Encounter (INDEPENDENT_AMBULATORY_CARE_PROVIDER_SITE_OTHER): Payer: Self-pay | Admitting: *Deleted

## 2022-09-25 ENCOUNTER — Other Ambulatory Visit: Payer: Self-pay | Admitting: Family Medicine

## 2022-09-30 LAB — CMP14+EGFR
ALT: 24 IU/L (ref 0–44)
AST: 19 IU/L (ref 0–40)
Albumin/Globulin Ratio: 1.7 (ref 1.2–2.2)
Albumin: 4.7 g/dL (ref 3.8–4.9)
Alkaline Phosphatase: 101 IU/L (ref 44–121)
BUN/Creatinine Ratio: 11 (ref 9–20)
BUN: 19 mg/dL (ref 6–24)
Bilirubin Total: 0.4 mg/dL (ref 0.0–1.2)
CO2: 23 mmol/L (ref 20–29)
Calcium: 10.3 mg/dL — ABNORMAL HIGH (ref 8.7–10.2)
Chloride: 105 mmol/L (ref 96–106)
Creatinine, Ser: 1.68 mg/dL — ABNORMAL HIGH (ref 0.76–1.27)
Globulin, Total: 2.7 g/dL (ref 1.5–4.5)
Glucose: 138 mg/dL — ABNORMAL HIGH (ref 70–99)
Potassium: 4.5 mmol/L (ref 3.5–5.2)
Sodium: 143 mmol/L (ref 134–144)
Total Protein: 7.4 g/dL (ref 6.0–8.5)
eGFR: 47 mL/min/{1.73_m2} — ABNORMAL LOW (ref >59)

## 2022-09-30 LAB — CBC WITH DIFFERENTIAL/PLATELET
Basophils Absolute: 0 10*3/uL (ref 0.0–0.2)
Basos: 1 %
EOS (ABSOLUTE): 0.3 10*3/uL (ref 0.0–0.4)
Eos: 3 %
Hematocrit: 47.4 % (ref 37.5–51.0)
Hemoglobin: 16.5 g/dL (ref 13.0–17.7)
Immature Grans (Abs): 0 10*3/uL (ref 0.0–0.1)
Immature Granulocytes: 1 %
Lymphocytes Absolute: 2.8 10*3/uL (ref 0.7–3.1)
Lymphs: 32 %
MCH: 28.7 pg (ref 26.6–33.0)
MCHC: 34.8 g/dL (ref 31.5–35.7)
MCV: 82 fL (ref 79–97)
Monocytes Absolute: 0.7 10*3/uL (ref 0.1–0.9)
Monocytes: 8 %
Neutrophils Absolute: 4.7 10*3/uL (ref 1.4–7.0)
Neutrophils: 55 %
Platelets: 360 10*3/uL (ref 150–450)
RBC: 5.75 x10E6/uL (ref 4.14–5.80)
RDW: 14.9 % (ref 11.6–15.4)
WBC: 8.5 10*3/uL (ref 3.4–10.8)

## 2022-09-30 LAB — MICROALBUMIN / CREATININE URINE RATIO
Creatinine, Urine: 222.8 mg/dL
Microalb/Creat Ratio: 7 mg/g creat (ref 0–29)
Microalbumin, Urine: 14.5 ug/mL

## 2022-09-30 LAB — HEMOGLOBIN A1C
Est. average glucose Bld gHb Est-mCnc: 269 mg/dL
Hgb A1c MFr Bld: 11 % — ABNORMAL HIGH (ref 4.8–5.6)

## 2022-09-30 LAB — LIPID PANEL
Chol/HDL Ratio: 3 ratio (ref 0.0–5.0)
Cholesterol, Total: 131 mg/dL (ref 100–199)
HDL: 43 mg/dL (ref >39)
LDL Chol Calc (NIH): 57 mg/dL (ref 0–99)
Triglycerides: 191 mg/dL — ABNORMAL HIGH (ref 0–149)
VLDL Cholesterol Cal: 31 mg/dL (ref 5–40)

## 2022-10-04 ENCOUNTER — Ambulatory Visit (INDEPENDENT_AMBULATORY_CARE_PROVIDER_SITE_OTHER): Payer: 59 | Admitting: Family Medicine

## 2022-10-04 DIAGNOSIS — E782 Mixed hyperlipidemia: Secondary | ICD-10-CM

## 2022-10-04 DIAGNOSIS — E1122 Type 2 diabetes mellitus with diabetic chronic kidney disease: Secondary | ICD-10-CM | POA: Diagnosis not present

## 2022-10-04 DIAGNOSIS — I10 Essential (primary) hypertension: Secondary | ICD-10-CM

## 2022-10-04 DIAGNOSIS — N1831 Chronic kidney disease, stage 3a: Secondary | ICD-10-CM

## 2022-10-04 DIAGNOSIS — Z794 Long term (current) use of insulin: Secondary | ICD-10-CM

## 2022-10-04 NOTE — Assessment & Plan Note (Signed)
Uncontrolled.  He has recently made significant lifestyle changes.  He has therefore decreased his insulin as well as metformin.  I agree with the changes given his improving blood sugars.  No hypoglycemia.  Follow-up in 3 months for reevaluation.

## 2022-10-04 NOTE — Assessment & Plan Note (Signed)
Recent bump in creatinine.  Metabolic panel in the next month.

## 2022-10-04 NOTE — Assessment & Plan Note (Signed)
LDL at goal.  Continue Crestor.  Anticipate triglycerides will improve with improving glycemic control.

## 2022-10-04 NOTE — Progress Notes (Signed)
Subjective:  Patient ID: Philip Caldwell, male    DOB: 09/20/1966  Age: 56 y.o. MRN: [id number]  CC: Chief Complaint  Patient presents with   Diabetes    HPI:  56 year old male with hypertension, uncontrolled type 2 diabetes, chronic kidney disease, hyperlipidemia presents for follow-up.  Patient's tachycardia [place] has been uncontrolled and worsening.  Most recent A1c 11.0 on 11/1.  He notes that for the past 3 weeks his blood sugars have been markedly improved after he made significant lifestyle changes.  This has not been reflected in his A1c due to prior poor control.  He has decreased his insulin to 15 to 20 units at night.  He is also backed off on his metformin to 500 mg twice daily.  Patient's recent renal function worsened from 1.24 to 1.68.  Will discuss today.  Hypertension is well controlled on losartan.  LDL at goal on Crestor.  Triglycerides mildly elevated.  Likely secondary to his type 2 diabetes.  Patient Active Problem List   Diagnosis Date Noted   CKD (chronic kidney disease) stage 3, GFR 30-59 ml/min (HCC) 01/04/2022   Mixed hyperlipidemia 12/28/2021   Type 2 diabetes mellitus with diabetic chronic kidney disease ([place]) 11/06/2018   Essential hypertension, benign 10/14/2014    Social Hx   Social History   Socioeconomic History   Marital status: Married    Spouse name: Not on file   Number of children: Not on file   Years of education: Not on file   Highest education level: Not on file  Occupational History   Not on file  Tobacco Use   Smoking status: Never   Smokeless tobacco: Never  Vaping Use   Vaping Use: Never used  Substance and Sexual Activity   Alcohol use: No   Drug use: No   Sexual activity: Yes    Birth control/protection: None  Other Topics Concern   Not on file  Social History Narrative   Not on file   Social Determinants of Health   Financial Resource Strain: Not on file  Food Insecurity: Not on file  Transportation Needs:  Not on file  Physical Activity: Not on file  Stress: Not on file  Social Connections: Not on file    Review of Systems  Constitutional: Negative.   Respiratory: Negative.    Cardiovascular: Negative.    Objective:  BP 126/64   Pulse 83   Temp (!) 97.2 F (36.2 C)   Ht '5\' 9"'$  (1.753 m)   Wt 188 lb (85.3 kg)   SpO2 99%   BMI 27.76 kg/m      10/04/2022    9:45 AM 09/16/2022   10:48 AM 04/13/2022    8:12 AM  BP/Weight  Systolic BP 409 811 914  Diastolic BP 64 71 76  Wt. (Lbs) 188  192  BMI 27.76 kg/m2  28.35 kg/m2    Physical Exam Vitals and nursing note reviewed.  Constitutional:      General: He is not in acute distress.    Appearance: Normal appearance. He is not ill-appearing.  HENT:     Head: Normocephalic and atraumatic.  Eyes:     General:        Right eye: No discharge.        Left eye: No discharge.     Conjunctiva/sclera: Conjunctivae normal.  Cardiovascular:     Rate and Rhythm: Normal rate and regular rhythm.  Pulmonary:     Effort: Pulmonary effort is normal.  Breath sounds: Normal breath sounds. No wheezing, rhonchi or rales.  Neurological:     Mental Status: He is alert.  Psychiatric:        Mood and Affect: Mood normal.        Behavior: Behavior normal.     Lab Results  Component Value Date   WBC 8.5 09/29/2022   HGB 16.5 09/29/2022   HCT 47.4 09/29/2022   PLT 360 09/29/2022   GLUCOSE 138 (H) 09/29/2022   CHOL 131 09/29/2022   TRIG 191 (H) 09/29/2022   HDL 43 09/29/2022   LDLCALC 57 09/29/2022   ALT 24 09/29/2022   AST 19 09/29/2022   NA 143 09/29/2022   K 4.5 09/29/2022   CL 105 09/29/2022   CREATININE 1.68 (H) 09/29/2022   BUN 19 09/29/2022   CO2 23 09/29/2022   TSH 2.440 12/10/2021   PSA 1.12 09/09/2014   INR 0.9 01/03/2022   HGBA1C 11.0 (H) 09/29/2022     Assessment & Plan:   Problem List Items Addressed This Visit       Cardiovascular and Mediastinum   Essential hypertension, benign    Recent bump in  creatinine.  Metabolic panel ordered for recheck.  Continue losartan for now.        Endocrine   Type 2 diabetes mellitus with diabetic chronic kidney disease ([place])    Uncontrolled.  He has recently made significant lifestyle changes.  He has therefore decreased his insulin as well as metformin.  I agree with the changes given his improving blood sugars.  No hypoglycemia.  Follow-up in 3 months for reevaluation.        Genitourinary   CKD (chronic kidney disease) stage 3, GFR 30-59 ml/min (HCC) - Primary    Recent bump in creatinine.  Metabolic panel in the next month.      Relevant Orders   Basic Metabolic Panel     Other   Mixed hyperlipidemia    LDL at goal.  Continue Crestor.  Anticipate triglycerides will improve with improving glycemic control.       Follow-up:  Return in about 3 months (around 01/04/2023).  [place]

## 2022-10-04 NOTE — Patient Instructions (Signed)
Lab within the next month.  Follow up in 3 months.  Continue with your current medications at current dosing.  Take care  Dr. [name]

## 2022-10-04 NOTE — Assessment & Plan Note (Signed)
Recent bump in creatinine.  Metabolic panel ordered for recheck.  Continue losartan for now.

## 2022-10-29 ENCOUNTER — Other Ambulatory Visit: Payer: Self-pay | Admitting: Family Medicine

## 2022-10-29 DIAGNOSIS — I10 Essential (primary) hypertension: Secondary | ICD-10-CM

## 2022-10-29 DIAGNOSIS — N1831 Chronic kidney disease, stage 3a: Secondary | ICD-10-CM

## 2022-11-03 ENCOUNTER — Encounter: Payer: Self-pay | Admitting: Family Medicine

## 2022-11-03 LAB — BASIC METABOLIC PANEL
BUN/Creatinine Ratio: 11 (ref 9–20)
BUN: 15 mg/dL (ref 6–24)
CO2: 24 mmol/L (ref 20–29)
Calcium: 9.7 mg/dL (ref 8.7–10.2)
Chloride: 99 mmol/L (ref 96–106)
Creatinine, Ser: 1.34 mg/dL — ABNORMAL HIGH (ref 0.76–1.27)
Glucose: 186 mg/dL — ABNORMAL HIGH (ref 70–99)
Potassium: 4.5 mmol/L (ref 3.5–5.2)
Sodium: 139 mmol/L (ref 134–144)
eGFR: 61 mL/min/{1.73_m2} (ref >59)

## 2022-12-20 ENCOUNTER — Telehealth: Payer: Self-pay | Admitting: Family Medicine

## 2022-12-20 ENCOUNTER — Encounter: Payer: Self-pay | Admitting: Family Medicine

## 2022-12-20 DIAGNOSIS — I10 Essential (primary) hypertension: Secondary | ICD-10-CM

## 2022-12-20 DIAGNOSIS — E782 Mixed hyperlipidemia: Secondary | ICD-10-CM

## 2022-12-20 DIAGNOSIS — Z794 Long term (current) use of insulin: Secondary | ICD-10-CM

## 2022-12-20 DIAGNOSIS — N1831 Chronic kidney disease, stage 3a: Secondary | ICD-10-CM

## 2022-12-20 NOTE — Telephone Encounter (Signed)
Patient has an appt coming up on 2/5 he would like to know if he can get his blood work prior to coming in. He states he knows he needs his A1c checked and thinks he normally has other labs done.  Most recent labs in 09/29/22 were cmp/bmp, cbc, A1c, urine micro ratio, lipid  CB# [phone]

## 2022-12-20 NOTE — Telephone Encounter (Signed)
Patient has an appt coming up on 2/5 he would like to know if he can get his blood work prior to coming in. He states he knows he needs his A1c checked and thinks he normally has other labs done.  CB# [phone]

## 2022-12-21 ENCOUNTER — Other Ambulatory Visit: Payer: Self-pay

## 2022-12-21 DIAGNOSIS — I10 Essential (primary) hypertension: Secondary | ICD-10-CM

## 2022-12-21 DIAGNOSIS — Z794 Long term (current) use of insulin: Secondary | ICD-10-CM

## 2022-12-21 DIAGNOSIS — N1831 Chronic kidney disease, stage 3a: Secondary | ICD-10-CM

## 2022-12-21 DIAGNOSIS — E782 Mixed hyperlipidemia: Secondary | ICD-10-CM

## 2022-12-27 LAB — CMP14+EGFR
ALT: 20 IU/L (ref 0–44)
AST: 15 IU/L (ref 0–40)
Albumin/Globulin Ratio: 1.5 (ref 1.2–2.2)
Albumin: 4.3 g/dL (ref 3.8–4.9)
Alkaline Phosphatase: 87 IU/L (ref 44–121)
BUN/Creatinine Ratio: 11 (ref 9–20)
BUN: 16 mg/dL (ref 6–24)
Bilirubin Total: 0.3 mg/dL (ref 0.0–1.2)
CO2: 25 mmol/L (ref 20–29)
Calcium: 9.6 mg/dL (ref 8.7–10.2)
Chloride: 102 mmol/L (ref 96–106)
Creatinine, Ser: 1.48 mg/dL — ABNORMAL HIGH (ref 0.76–1.27)
Globulin, Total: 2.8 g/dL (ref 1.5–4.5)
Glucose: 136 mg/dL — ABNORMAL HIGH (ref 70–99)
Potassium: 4.4 mmol/L (ref 3.5–5.2)
Sodium: 141 mmol/L (ref 134–144)
Total Protein: 7.1 g/dL (ref 6.0–8.5)
eGFR: 55 mL/min/{1.73_m2} — ABNORMAL LOW (ref >59)

## 2022-12-27 LAB — LIPID PANEL
Chol/HDL Ratio: 3.2 ratio (ref 0.0–5.0)
Cholesterol, Total: 126 mg/dL (ref 100–199)
HDL: 40 mg/dL
LDL Chol Calc (NIH): 59 mg/dL (ref 0–99)
Triglycerides: 155 mg/dL — ABNORMAL HIGH (ref 0–149)
VLDL Cholesterol Cal: 27 mg/dL (ref 5–40)

## 2022-12-27 LAB — CBC WITH DIFFERENTIAL/PLATELET
Basophils Absolute: 0.1 10*3/uL (ref 0.0–0.2)
Basos: 1 %
EOS (ABSOLUTE): 0.3 10*3/uL (ref 0.0–0.4)
Eos: 3 %
Hematocrit: 46.9 % (ref 37.5–51.0)
Hemoglobin: 16.3 g/dL (ref 13.0–17.7)
Immature Grans (Abs): 0 10*3/uL (ref 0.0–0.1)
Immature Granulocytes: 0 %
Lymphocytes Absolute: 2.7 10*3/uL (ref 0.7–3.1)
Lymphs: 31 %
MCH: 28.5 pg (ref 26.6–33.0)
MCHC: 34.8 g/dL (ref 31.5–35.7)
MCV: 82 fL (ref 79–97)
Monocytes Absolute: 0.6 10*3/uL (ref 0.1–0.9)
Monocytes: 7 %
Neutrophils Absolute: 5.2 10*3/uL (ref 1.4–7.0)
Neutrophils: 58 %
Platelets: 342 10*3/uL (ref 150–450)
RBC: 5.72 x10E6/uL (ref 4.14–5.80)
RDW: 13.9 % (ref 11.6–15.4)
WBC: 8.9 10*3/uL (ref 3.4–10.8)

## 2022-12-27 LAB — MICROALBUMIN / CREATININE URINE RATIO
Creatinine, Urine: 178 mg/dL
Microalb/Creat Ratio: 2 mg/g creat (ref 0–29)
Microalbumin, Urine: 3.8 ug/mL

## 2022-12-27 LAB — HEMOGLOBIN A1C
Est. average glucose Bld gHb Est-mCnc: 169 mg/dL
Hgb A1c MFr Bld: 7.5 % — ABNORMAL HIGH (ref 4.8–5.6)

## 2023-01-03 ENCOUNTER — Ambulatory Visit: Payer: 59 | Admitting: Family Medicine

## 2023-01-03 ENCOUNTER — Ambulatory Visit (INDEPENDENT_AMBULATORY_CARE_PROVIDER_SITE_OTHER): Payer: 59 | Admitting: Family Medicine

## 2023-01-03 DIAGNOSIS — N1831 Chronic kidney disease, stage 3a: Secondary | ICD-10-CM

## 2023-01-03 DIAGNOSIS — I10 Essential (primary) hypertension: Secondary | ICD-10-CM

## 2023-01-03 DIAGNOSIS — E1122 Type 2 diabetes mellitus with diabetic chronic kidney disease: Secondary | ICD-10-CM | POA: Diagnosis not present

## 2023-01-03 DIAGNOSIS — E782 Mixed hyperlipidemia: Secondary | ICD-10-CM | POA: Diagnosis not present

## 2023-01-03 DIAGNOSIS — Z794 Long term (current) use of insulin: Secondary | ICD-10-CM

## 2023-01-03 MED ORDER — METFORMIN HCL 1000 MG PO TABS: 1000.0000 mg | ORAL_TABLET | Freq: Two times a day (BID) | ORAL | 1 refills | Status: DC

## 2023-01-03 NOTE — Patient Instructions (Signed)
Metformin 1000 mg twice daily.  Follow up in 3 months.

## 2023-01-04 NOTE — Assessment & Plan Note (Signed)
GFR 55.  Will continue to monitor closely.  If worsens, will refer to nephrology.  No proteinuria.

## 2023-01-04 NOTE — Assessment & Plan Note (Signed)
Well-controlled on Crestor.  Continue.

## 2023-01-04 NOTE — Assessment & Plan Note (Signed)
Significant improvement in A1c.  Stopping Levemir.  Increasing metformin to 1000 mg twice daily.  Renal function allows for this.  Follow-up in 3 months.

## 2023-01-04 NOTE — Assessment & Plan Note (Signed)
Stable.  Continue losartan.  Monitoring renal function closely.

## 2023-01-04 NOTE — Progress Notes (Signed)
Subjective:  Patient ID: Philip Caldwell, male    DOB: 09/24/1966  Age: 57 y.o. MRN: [id number]  CC: Chief Complaint  Patient presents with   Diabetes    3 month follow up , not using levemir due to blood sugars being average 122 blood sugars Levemir no longer covered, states has enough for the rest of the year, only taking metformin 1,000 per day Discuss coming off all meds     HPI:  57 year old male with hypertension, type 2 diabetes, chronic kidney disease, hyperlipidemia presents for follow-up.  Hypertension is well-controlled on losartan.  Patient had a recent increase in creatinine.  GFR 55.  In regards to his lipids, LDL at goal.  Most recent LDL was 59.  Tolerating Crestor.  Patient has had a dramatic improvement in his A1c.  Last A1c was 11.  Most recent A1c as of 12/24/2022 was 7.5.  He states that he has dramatically changed his diet.  He is eating mostly fruits and vegetables.  He is no longer taking his insulin as his blood sugars have been fairly well-controlled.  He continues on metformin.  He is taking half the current dose.  Currently taking 500 mg twice daily.  Patient Active Problem List   Diagnosis Date Noted   CKD (chronic kidney disease) stage 3, GFR 30-59 ml/min (HCC) 01/04/2022   Mixed hyperlipidemia 12/28/2021   Type 2 diabetes mellitus with diabetic chronic kidney disease ([place]) 11/06/2018   Essential hypertension, benign 10/14/2014    Social Hx   Social History   Socioeconomic History   Marital status: Married    Spouse name: Not on file   Number of children: Not on file   Years of education: Not on file   Highest education level: Not on file  Occupational History   Not on file  Tobacco Use   Smoking status: Never   Smokeless tobacco: Never  Vaping Use   Vaping Use: Never used  Substance and Sexual Activity   Alcohol use: No   Drug use: No   Sexual activity: Yes    Birth control/protection: None  Other Topics Concern   Not on file  Social  History Narrative   Not on file   Social Determinants of Health   Financial Resource Strain: Not on file  Food Insecurity: Not on file  Transportation Needs: Not on file  Physical Activity: Not on file  Stress: Not on file  Social Connections: Not on file    Review of Systems Per HPI  Objective:  BP 122/80   Pulse 86   Temp (!) 97.2 F (36.2 C)   Ht '5\' 9"'$  (1.753 m)   Wt 191 lb (86.6 kg)   SpO2 97%   BMI 28.21 kg/m      01/03/2023    3:02 PM 10/04/2022    9:45 AM 09/16/2022   10:48 AM  BP/Weight  Systolic BP [phone]  Diastolic BP 80 64 71  Wt. (Lbs) 191 188   BMI 28.21 kg/m2 27.76 kg/m2     Physical Exam Vitals and nursing note reviewed.  Constitutional:      General: He is not in acute distress.    Appearance: Normal appearance.  HENT:     Head: Normocephalic and atraumatic.  Cardiovascular:     Rate and Rhythm: Normal rate and regular rhythm.  Pulmonary:     Effort: Pulmonary effort is normal.     Breath sounds: Normal breath sounds. No wheezing, rhonchi or rales.  Neurological:     Mental Status: He is alert.  Psychiatric:        Mood and Affect: Mood normal.     Lab Results  Component Value Date   WBC 8.9 12/24/2022   HGB 16.3 12/24/2022   HCT 46.9 12/24/2022   PLT 342 12/24/2022   GLUCOSE 136 (H) 12/24/2022   CHOL 126 12/24/2022   TRIG 155 (H) 12/24/2022   HDL 40 12/24/2022   LDLCALC 59 12/24/2022   ALT 20 12/24/2022   AST 15 12/24/2022   NA 141 12/24/2022   K 4.4 12/24/2022   CL 102 12/24/2022   CREATININE 1.48 (H) 12/24/2022   BUN 16 12/24/2022   CO2 25 12/24/2022   TSH 2.440 12/10/2021   PSA 1.12 09/09/2014   INR 0.9 01/03/2022   HGBA1C 7.5 (H) 12/24/2022     Assessment & Plan:   Problem List Items Addressed This Visit       Cardiovascular and Mediastinum   Essential hypertension, benign    Stable.  Continue losartan.  Monitoring renal function closely.        Endocrine   Type 2 diabetes mellitus with diabetic  chronic kidney disease ([place])    Significant improvement in A1c.  Stopping Levemir.  Increasing metformin to 1000 mg twice daily.  Renal function allows for this.  Follow-up in 3 months.      Relevant Medications   metFORMIN (GLUCOPHAGE) 1000 MG tablet     Genitourinary   CKD (chronic kidney disease) stage 3, GFR 30-59 ml/min (HCC)    GFR 55.  Will continue to monitor closely.  If worsens, will refer to nephrology.  No proteinuria.        Other   Mixed hyperlipidemia    Well-controlled on Crestor.  Continue.       Meds ordered this encounter  Medications   metFORMIN (GLUCOPHAGE) 1000 MG tablet    Sig: Take 1 tablet (1,000 mg total) by mouth 2 (two) times daily.    Dispense:  180 tablet    Refill:  1    Follow-up:  Return in about 3 months (around 04/03/2023).  [place]

## 2023-01-26 ENCOUNTER — Other Ambulatory Visit: Payer: Self-pay | Admitting: Family Medicine

## 2023-01-26 DIAGNOSIS — E1122 Type 2 diabetes mellitus with diabetic chronic kidney disease: Secondary | ICD-10-CM

## 2023-01-26 DIAGNOSIS — N1831 Chronic kidney disease, stage 3a: Secondary | ICD-10-CM

## 2023-01-26 DIAGNOSIS — I10 Essential (primary) hypertension: Secondary | ICD-10-CM

## 2023-01-27 ENCOUNTER — Encounter: Payer: Self-pay | Admitting: Radiology

## 2023-02-02 ENCOUNTER — Ambulatory Visit: Payer: 59 | Admitting: Family Medicine

## 2023-02-16 LAB — HM DIABETES EYE EXAM

## 2023-02-25 ENCOUNTER — Encounter: Payer: Self-pay | Admitting: *Deleted

## 2023-03-07 ENCOUNTER — Ambulatory Visit: Payer: 59 | Admitting: Family Medicine

## 2023-04-04 ENCOUNTER — Ambulatory Visit: Payer: 59 | Admitting: Family Medicine

## 2023-04-11 ENCOUNTER — Other Ambulatory Visit: Payer: Self-pay | Admitting: Family Medicine

## 2023-05-01 ENCOUNTER — Other Ambulatory Visit: Payer: Self-pay | Admitting: Family Medicine

## 2023-05-01 DIAGNOSIS — N1831 Chronic kidney disease, stage 3a: Secondary | ICD-10-CM

## 2023-05-01 DIAGNOSIS — I10 Essential (primary) hypertension: Secondary | ICD-10-CM

## 2023-05-01 DIAGNOSIS — Z794 Long term (current) use of insulin: Secondary | ICD-10-CM

## 2023-05-02 ENCOUNTER — Other Ambulatory Visit: Payer: Self-pay

## 2023-05-02 ENCOUNTER — Emergency Department (HOSPITAL_COMMUNITY): Payer: 59

## 2023-05-02 ENCOUNTER — Encounter (HOSPITAL_COMMUNITY): Payer: Self-pay | Admitting: Emergency Medicine

## 2023-05-02 ENCOUNTER — Emergency Department (HOSPITAL_COMMUNITY)
Admission: EM | Admit: 2023-05-02 | Discharge: 2023-05-02 | Discharge status: Against Medical Advice | Payer: 59 | Attending: Emergency Medicine | Admitting: Emergency Medicine

## 2023-05-02 ENCOUNTER — Emergency Department (HOSPITAL_COMMUNITY)
Admission: EM | Admit: 2023-05-02 | Discharge: 2023-05-03 | Disposition: A | Discharge status: Home / Self Care | Payer: 59 | Source: Home / Self Care | Attending: Emergency Medicine | Admitting: Emergency Medicine

## 2023-05-02 DIAGNOSIS — R519 Headache, unspecified: Secondary | ICD-10-CM | POA: Insufficient documentation

## 2023-05-02 DIAGNOSIS — Z5321 Procedure and treatment not carried out due to patient leaving prior to being seen by health care provider: Secondary | ICD-10-CM | POA: Insufficient documentation

## 2023-05-02 DIAGNOSIS — Z79899 Other long term (current) drug therapy: Secondary | ICD-10-CM | POA: Insufficient documentation

## 2023-05-02 DIAGNOSIS — I10 Essential (primary) hypertension: Secondary | ICD-10-CM | POA: Insufficient documentation

## 2023-05-02 DIAGNOSIS — M545 Low back pain, unspecified: Secondary | ICD-10-CM

## 2023-05-02 DIAGNOSIS — E119 Type 2 diabetes mellitus without complications: Secondary | ICD-10-CM | POA: Insufficient documentation

## 2023-05-02 DIAGNOSIS — Z7984 Long term (current) use of oral hypoglycemic drugs: Secondary | ICD-10-CM | POA: Insufficient documentation

## 2023-05-02 DIAGNOSIS — R109 Unspecified abdominal pain: Secondary | ICD-10-CM | POA: Insufficient documentation

## 2023-05-02 NOTE — ED Triage Notes (Signed)
Patient c/o bilateral flank pain started 4 days ago. Pt report worsening pain tonight. Pt report taking tylenol and advil without relief.Pt denies N/V.  Pt hx of kidney stones.

## 2023-05-02 NOTE — ED Notes (Signed)
Pt has decided to leave after triage because he wants a CT scan to determine whether or not renal stones are present. CT scanner at [facility] is currently not working so pt has elected to seek care elsewhere.

## 2023-05-02 NOTE — ED Triage Notes (Signed)
Pt reports severe left flank pain since Friday with prior hx kidney stones. No n/v but pt reports severe headache as well when pain increases. Denies other urinary symptoms. No known injury.

## 2023-05-03 LAB — COMPREHENSIVE METABOLIC PANEL
ALT: 18 U/L (ref 0–44)
AST: 17 U/L (ref 15–41)
Albumin: 4 g/dL (ref 3.5–5.0)
Alkaline Phosphatase: 63 U/L (ref 38–126)
Anion gap: 10 (ref 5–15)
BUN: 19 mg/dL (ref 6–20)
CO2: 22 mmol/L (ref 22–32)
Calcium: 8.8 mg/dL — ABNORMAL LOW (ref 8.9–10.3)
Chloride: 104 mmol/L (ref 98–111)
Creatinine, Ser: 1.42 mg/dL — ABNORMAL HIGH (ref 0.61–1.24)
GFR, Estimated: 57 mL/min — ABNORMAL LOW (ref >60)
Glucose, Bld: 126 mg/dL — ABNORMAL HIGH (ref 70–99)
Potassium: 3.8 mmol/L (ref 3.5–5.1)
Sodium: 136 mmol/L (ref 135–145)
Total Bilirubin: 0.7 mg/dL (ref 0.3–1.2)
Total Protein: 7.3 g/dL (ref 6.5–8.1)

## 2023-05-03 LAB — URINALYSIS, ROUTINE W REFLEX MICROSCOPIC
Bacteria, UA: NONE SEEN
Bilirubin Urine: NEGATIVE
Glucose, UA: NEGATIVE mg/dL
Hgb urine dipstick: NEGATIVE
Ketones, ur: NEGATIVE mg/dL
Leukocytes,Ua: NEGATIVE
Nitrite: NEGATIVE
Protein, ur: NEGATIVE mg/dL
Specific Gravity, Urine: 1.026 (ref 1.005–1.030)
pH: 5 (ref 5.0–8.0)

## 2023-05-03 LAB — CBC WITH DIFFERENTIAL/PLATELET
Abs Immature Granulocytes: 0.16 10*3/uL — ABNORMAL HIGH (ref 0.00–0.07)
Basophils Absolute: 0 10*3/uL (ref 0.0–0.1)
Basophils Relative: 0 %
Eosinophils Absolute: 0.2 10*3/uL (ref 0.0–0.5)
Eosinophils Relative: 2 %
HCT: 41 % (ref 39.0–52.0)
Hemoglobin: 14.6 g/dL (ref 13.0–17.0)
Immature Granulocytes: 2 %
Lymphocytes Relative: 32 %
Lymphs Abs: 3.4 10*3/uL (ref 0.7–4.0)
MCH: 28.3 pg (ref 26.0–34.0)
MCHC: 35.6 g/dL (ref 30.0–36.0)
MCV: 79.6 fL — ABNORMAL LOW (ref 80.0–100.0)
Monocytes Absolute: 0.9 10*3/uL (ref 0.1–1.0)
Monocytes Relative: 8 %
Neutro Abs: 6 10*3/uL (ref 1.7–7.7)
Neutrophils Relative %: 56 %
Platelets: 321 10*3/uL (ref 150–400)
RBC: 5.15 MIL/uL (ref 4.22–5.81)
RDW: 14.6 % (ref 11.5–15.5)
WBC: 10.6 10*3/uL — ABNORMAL HIGH (ref 4.0–10.5)
nRBC: 0 % (ref 0.0–0.2)

## 2023-05-03 MED ORDER — METHOCARBAMOL 500 MG PO TABS: 500.0000 mg | ORAL_TABLET | Freq: Two times a day (BID) | ORAL | 0 refills | Status: DC

## 2023-05-03 MED ORDER — METHOCARBAMOL 500 MG PO TABS
1000.0000 mg | ORAL_TABLET | Freq: Once | ORAL | Status: AC
  Administered 2023-05-03: 1000 mg via ORAL
  Filled 2023-05-03: qty 2

## 2023-05-03 MED ORDER — LIDOCAINE 5 % EX PTCH
1.0000 | MEDICATED_PATCH | CUTANEOUS | Status: DC
  Administered 2023-05-03: 1 via TRANSDERMAL
  Filled 2023-05-03: qty 1

## 2023-05-03 MED ORDER — OXYCODONE-ACETAMINOPHEN 5-325 MG PO TABS
1.0000 | ORAL_TABLET | Freq: Once | ORAL | Status: AC
  Administered 2023-05-03: 1 via ORAL
  Filled 2023-05-03: qty 1

## 2023-05-03 NOTE — Discharge Instructions (Addendum)
You were seen in the ER for evaluation of your lower back pain. I recommend Tylenol 1000mg  every 6 hours as needed for pain. You can also try lidocaine patches as well. I am going to send you home with a muscle relaxer as needed for pain. Please do not drive or operate heavy machinery while on this medication as it can make you sleepy. Additionally, I have added the information for a sports medicine provider for you to follow up with. If you have any concerns, new or worsening symptoms, please return to the nearest ER for re-evaluation.   Contact a health care provider if: You have pain that is not relieved with rest or medicine. You have increasing pain going down into your legs or buttocks. Your pain does not improve after 2 weeks. You have pain at night. You lose weight without trying. You have a fever or chills. You develop nausea or vomiting. You develop abdominal pain. Get help right away if: You develop new bowel or bladder control problems. You have unusual weakness or numbness in your arms or legs. You feel faint. These symptoms may represent a serious problem that is an emergency. Do not wait to see if the symptoms will go away. Get medical help right away. Call your local emergency services (911 in the [place] Do not drive yourself to the hospital.

## 2023-05-03 NOTE — ED Provider Notes (Signed)
[place] EMERGENCY DEPARTMENT AT [facility] Provider Note   CSN: [id number] Arrival date & time: 05/02/23  2211     History Chief Complaint  Patient presents with   Flank Pain    Philip Caldwell is a 57 y.o. male with h/o type 2 diabetes, hypertension, kidney stones presents the emergency room today for evaluation of left-sided lower back pain for the past 4 days after mowing his yard. The patient reports that the pain was similar to his kidney stone pain, but was not constant. He denies any abdominal pain, nausea, vomiting, diarrhea, constipation.  Denies any fecal or urinary incontinence, saddle anesthesia, fever, or any IV drug use.  Denies any weakness in the legs.  Denies any dysuria or hematuria. Reports it is more painful with standing and with twisting.  He reports he tried Tylenol and ibuprofen once without much relief of pain.  Was been trying some muscle rubs as well with minimal relief.  Pain is nonradiating.  Does not radiate to the abdomen or to the genitals.  Allergic to penicillin and lisinopril.  Denies any EtOH, tobacco, or drug use ever.   Flank Pain Pertinent negatives include no chest pain and no shortness of breath.       Home Medications Prior to Admission medications   Medication Sig Start Date End Date Taking? Authorizing Provider  Accu-Chek Softclix Lancets lancets Check glucose 3x per day. 11/18/20   Laroy Apple M, DO  Ascorbic Acid (VITAMIN C PO) Take 1 tablet by mouth 3 (three) times a week.    [provider]  Cholecalciferol (VITAMIN D3 PO) Take 1 tablet by mouth 3 (three) times a week.    [provider]  hydrocortisone 2.5 % ointment Apply 1 application topically 2 (two) times daily as needed (rash). 10/08/21   Babs Sciara, MD  Insulin Pen Needle (PEN NEEDLES) 31G X 6 MM MISC Use as directed with solostar pen. 11/17/20   Laroy Apple M, DO  Insulin Syringe-Needle U-100 (INSULIN SYRINGE .5CC/31GX5/16") 31G X 5/16"  0.5 ML MISC Use as directed qhs. 12/28/21   Nida, Denman George, MD  losartan (COZAAR) 25 MG tablet TAKE 2 TABLETS BY MOUTH EVERY DAY 05/02/23   Everlene Other G, DO  metFORMIN (GLUCOPHAGE) 1000 MG tablet Take 1 tablet (1,000 mg total) by mouth 2 (two) times daily. 01/03/23   Tommie Sams, DO  rosuvastatin (CRESTOR) 10 MG tablet TAKE 1 TABLET BY MOUTH EVERY DAY 04/11/23   Tommie Sams, DO      Allergies    Lisinopril and Penicillins    Review of Systems   Review of Systems  Constitutional:  Negative for chills and fever.  Respiratory:  Negative for shortness of breath.   Cardiovascular:  Negative for chest pain.  Gastrointestinal:  Negative for abdominal distention, constipation, diarrhea, nausea and vomiting.       Denies any fecal incontinence  Genitourinary:  Negative for dysuria and hematuria.       Denies any urinary incontinence or urinary retention.  Musculoskeletal:  Positive for back pain.       Denies any saddle anesthesia  Neurological:  Negative for weakness and numbness.    Physical Exam Updated Vital Signs BP (!) 142/88 (BP Location: Right Arm)   Pulse 84   Temp 98.3 F (36.8 C) (Oral)   Resp 16   Ht 5\' 9"  (1.753 m)   Wt 85 kg   SpO2 95%   BMI 27.67 kg/m  Physical  Exam Vitals and nursing note reviewed.  Constitutional:      General: He is not in acute distress.    Appearance: Normal appearance. He is not ill-appearing or toxic-appearing.  Eyes:     General: No scleral icterus. Pulmonary:     Effort: Pulmonary effort is normal. No respiratory distress.  Abdominal:     General: Abdomen is flat. Bowel sounds are normal.     Palpations: Abdomen is soft.     Tenderness: There is no abdominal tenderness. There is no right CVA tenderness or left CVA tenderness.  Musculoskeletal:       Back:     Comments: Tenderness to the marked above area. No increase in erythema or warmth. No fluctuance or induration to the area. No midline or paraspinal cervical, thoracic, or  lumbar tenderness to palpation.  Negative straight leg raises bilaterally.  Strength is out of 5 in patient's upper and lower bilateral extremities.  Sensation intact bilaterally.  Palpable pulses bilaterally.  Compartments are soft.  Skin:    General: Skin is dry.  Neurological:     General: No focal deficit present.     Mental Status: He is alert. Mental status is at baseline.  Psychiatric:        Mood and Affect: Mood normal.     ED Results / Procedures / Treatments   Labs (all labs ordered are listed, but only abnormal results are displayed) Labs Reviewed  CBC WITH DIFFERENTIAL/PLATELET - Abnormal; Notable for the following components:      Result Value   WBC 10.6 (*)    MCV 79.6 (*)    Abs Immature Granulocytes 0.16 (*)    All other components within normal limits  COMPREHENSIVE METABOLIC PANEL - Abnormal; Notable for the following components:   Glucose, Bld 126 (*)    Creatinine, Ser 1.42 (*)    Calcium 8.8 (*)    GFR, Estimated 57 (*)    All other components within normal limits  URINALYSIS, ROUTINE W REFLEX MICROSCOPIC    EKG None  Radiology CT Renal Stone Study  Result Date: 05/02/2023 CLINICAL DATA:  Left-sided flank pain for several days, initial encounter EXAM: CT ABDOMEN AND PELVIS WITHOUT CONTRAST TECHNIQUE: Multidetector CT imaging of the abdomen and pelvis was performed following the standard protocol without IV contrast. RADIATION DOSE REDUCTION: This exam was performed according to the departmental dose-optimization program which includes automated exposure control, adjustment of the mA and/or kV according to patient size and/or use of iterative reconstruction technique. COMPARISON:  01/03/22 FINDINGS: Lower chest: Lung bases are free of acute infiltrate or sizable effusion. Small subpleural nodule is noted on the right best seen on image number 7 of series 5 stable over multiple previous exams dating back to 2021. No further follow-up is recommended. Hepatobiliary:  No focal liver abnormality is seen. No gallstones, gallbladder wall thickening, or biliary dilatation. Pancreas: Unremarkable. No pancreatic ductal dilatation or surrounding inflammatory changes. Spleen: Normal in size without focal abnormality. Adrenals/Urinary Tract: Adrenal glands are within normal limits. Kidneys are well visualize without evidence of renal calculi. Multiple renal cysts are identified bilaterally. The largest of these is noted on the right measuring up to 13.4 cm in dimension. Some peripheral mural calcification is noted. These are unchanged dating back to 2021 and felt to be benign in etiology. No further follow-up is recommended. No obstructive changes are seen. The bladder is decompressed. Stomach/Bowel: Scattered diverticular change of the colon is noted. No evidence of diverticulitis. The appendix is within  normal limits. Small bowel and stomach are unremarkable. Vascular/Lymphatic: No significant vascular findings are present. No enlarged abdominal or pelvic lymph nodes. Reproductive: Prostate is unremarkable. Other: No abdominal wall hernia or abnormality. No abdominopelvic ascites. Musculoskeletal: No acute or significant osseous findings. IMPRESSION: No acute abnormality is identified. Diverticulosis without diverticulitis. Electronically Signed   By: Alcide Clever M.D.   On: 05/02/2023 23:50    Procedures Procedures  Medications Ordered in ED Medications  lidocaine (LIDODERM) 5 % 1 patch (1 patch Transdermal Patch Applied 05/03/23 0056)  methocarbamol (ROBAXIN) tablet 1,000 mg (1,000 mg Oral Given 05/03/23 0055)  oxyCODONE-acetaminophen (PERCOCET/ROXICET) 5-325 MG per tablet 1 tablet (1 tablet Oral Given 05/03/23 0056)    ED Course/ Medical Decision Making/ A&P                           Medical Decision Making Amount and/or Complexity of Data Reviewed Labs: ordered. Radiology: ordered.  Risk Prescription drug management.   57 y.o. male presents to the ER for evaluation  of lower left back pain. Differential diagnosis includes but is not limited to Fracture (acute/chronic), muscle strain, cauda equina, spinal stenosis, DDD, ligamentous injury, disk herniation, metastatic cancer, vertebral osteomyelitis, kidney stone, pyelonephritis, AAA, pancreatitis, bowel obstruction, meningitis. Vital signs unremarkable. Physical exam as noted above.   Given that the patient reports this feels similar to his kidney stones, however he looks comfortable, will order labs and CT renal stone scan.  I independently reviewed and interpreted the patient's labs. CBC show slightly increase in WBC, however normal neutrophil counts. CMP shows Creatinine at baseline. Mild increase in glucose, but known type 2 diabetic. Urine unremarkable.  CT renal scan shows No acute abnormality is identified. Diverticulosis without diverticulitis.  This is likely muscle skeletal pain given it is worse with movement.  I have low suspicion for any AAA as his pain is tender upon palpation without any chest pain or shortness of breath.  Does not have any back red flag symptoms, doubt cauda equina.  He does not have any midline tenderness palpation, less likely DDD or disc herniation.  There is no focal weakness.  He is neurovascular intact distally.  CT scan does show some diverticulosis but no diverticulitis.  Patient reports he already knows about the renal cyst seen on his CT imaging.  Likely muscle skeletal from mowing his grass.  Robaxin, Percocet, and lidocaine patch given here.  Wife is driving him home.  We discussed the results of the labs/imaging. The plan is follow up with sports medicine, take medication as prescribed. We discussed strict return precautions and red flag symptoms. The patient verbalized their understanding and agrees to the plan. The patient is stable and being discharged home in good condition.  Portions of this report may have been transcribed using voice recognition software. Every  effort was made to ensure accuracy; however, inadvertent computerized transcription errors may be present.   Final Clinical Impression(s) / ED Diagnoses Final diagnoses:  Acute left-sided low back pain without sciatica    Rx / DC Orders ED Discharge Orders          Ordered    methocarbamol (ROBAXIN) 500 MG tablet  2 times daily        05/03/23 0142              Achille Rich, PA-C 05/03/23 4540    Palumbo, April, MD 05/03/23 [phone]

## 2023-05-04 ENCOUNTER — Telehealth: Payer: Self-pay

## 2023-05-04 NOTE — Transitions of Care (Post Inpatient/ED Visit) (Signed)
   05/04/2023  Name: Philip Caldwell MRN: [id number] DOB: 1966/11/02  Today's TOC FU Call Status: Today's TOC FU Call Status:: Successful TOC FU Call Competed TOC FU Call Complete Date: 05/04/23  Transition Care Management Follow-up Telephone Call Date of Discharge: 05/03/23 Discharge Facility: [facility]) Type of Discharge: Emergency Department Reason for ED Visit: Other: (LBP) How have you been since you were released from the hospital?: Better  Items Reviewed: Did you receive and understand the discharge instructions provided?: No Medications obtained,verified, and reconciled?: Yes (Medications Reviewed) Any new allergies since your discharge?: No Dietary orders reviewed?: Yes Do you have support at home?: No  Medications Reviewed Today: Medications Reviewed Today     Reviewed by Karena Addison, LPN (Licensed Practical Nurse) on 05/04/23 at 1105  Med List Status: <None>   Medication Order Taking? Sig Documenting Provider Last Dose Status Informant  Accu-Chek Softclix Lancets lancets [id number] Yes Check glucose 3x per day. Laroy Apple M, DO Taking Active Self  Ascorbic Acid (VITAMIN C PO) [id number] Yes Take 1 tablet by mouth 3 (three) times a week. [provider] Taking Active Self  Cholecalciferol (VITAMIN D3 PO) [id number] Yes Take 1 tablet by mouth 3 (three) times a week. [provider] Taking Active Self  hydrocortisone 2.5 % ointment [id number] Yes Apply 1 application topically 2 (two) times daily as needed (rash). Babs Sciara, MD Taking Active Self           Med Note Gabrielle Dare Oct 04, 2022  9:50 AM) pn  Insulin Pen Needle (PEN NEEDLES) 31G X 6 MM MISC [id number] Yes Use as directed with solostar pen. Laroy Apple M, DO Taking Active Self  Insulin Syringe-Needle U-100 (INSULIN SYRINGE .5CC/31GX5/16") 31G X 5/16" 0.5 ML MISC [id number] Yes Use as directed qhs. Roma Kayser, MD Taking Active Self  losartan (COZAAR) 25 MG  tablet [id number] Yes TAKE 2 TABLETS BY MOUTH EVERY DAY Tommie Sams, DO Taking Active   metFORMIN (GLUCOPHAGE) 1000 MG tablet [id number] Yes Take 1 tablet (1,000 mg total) by mouth 2 (two) times daily. Tommie Sams, DO Taking Active   methocarbamol (ROBAXIN) 500 MG tablet [id number] Yes Take 1 tablet (500 mg total) by mouth 2 (two) times daily. Achille Rich, PA-C Taking Active   rosuvastatin (CRESTOR) 10 MG tablet [id number] Yes TAKE 1 TABLET BY MOUTH EVERY DAY Tommie Sams, DO Taking Active             Home Care and Equipment/Supplies: Were Home Health Services Ordered?: NA Any new equipment or medical supplies ordered?: NA  Functional Questionnaire: Do you need assistance with bathing/showering or dressing?: No Do you need assistance with meal preparation?: No Do you need assistance with eating?: No Do you have difficulty maintaining continence: No Do you need assistance with getting out of bed/getting out of a chair/moving?: No Do you have difficulty managing or taking your medications?: No  Follow up appointments reviewed: PCP Follow-up appointment confirmed?: NA Specialist Hospital Follow-up appointment confirmed?: NA Do you need transportation to your follow-up appointment?: No Do you understand care options if your condition(s) worsen?: Yes-patient verbalized understanding    SIGNATURE Karena Addison, LPN [facility] Nurse Health Advisor Direct Dial [phone]

## 2023-05-09 ENCOUNTER — Ambulatory Visit (INDEPENDENT_AMBULATORY_CARE_PROVIDER_SITE_OTHER): Payer: 59 | Admitting: Nurse Practitioner

## 2023-05-09 ENCOUNTER — Encounter: Payer: Self-pay | Admitting: Nurse Practitioner

## 2023-05-09 VITALS — BP 118/78 | HR 90 | Wt 192.0 lb

## 2023-05-09 DIAGNOSIS — M792 Neuralgia and neuritis, unspecified: Secondary | ICD-10-CM

## 2023-05-09 DIAGNOSIS — R10A2 Flank pain, left side: Secondary | ICD-10-CM

## 2023-05-09 DIAGNOSIS — R109 Unspecified abdominal pain: Secondary | ICD-10-CM

## 2023-05-09 HISTORY — DX: Neuralgia and neuritis, unspecified: M79.2

## 2023-05-09 MED ORDER — PREDNISONE 20 MG PO TABS: ORAL_TABLET | ORAL | 0 refills | Status: DC

## 2023-05-09 MED ORDER — AMITRIPTYLINE HCL 10 MG PO TABS: ORAL_TABLET | ORAL | 0 refills | Status: DC

## 2023-05-09 NOTE — Progress Notes (Addendum)
Subjective:    Patient ID: Philip Caldwell, male    DOB: [birth year], 57 y.o.   MRN: [id number]  HPI ER follow up left flank pain for a week , CT scan done  Taking ibuprofen, tylenol, muscle relaxer, pain patches, stretches Presents for follow-up after an ED visit on 05/02/2023 for acute left-sided low back pain without sciatica.  See previous note.  No specific history of injury.  Symptoms began about 10 days ago.  Began gradually.  No worsening of symptoms but no improvement since his ED visit.  Describes as a constant ache, states he knows it is there.  Will have an occasional sharp pain especially with movement.  Symptoms improved with walking.  Worse after sitting or lying down for a while and trying to get up.  Does bother him at nighttime.  No rash in this area.  No change in bowel or bladder habits.  No relief with muscle relaxants.  Did not want to take any NSAIDs due to his kidney issues.  Some relief with lidocaine patches.   Review of Systems  Constitutional:  Negative for fever.  Respiratory:  Negative for cough, chest tightness and shortness of breath.   Cardiovascular:  Negative for chest pain.  Musculoskeletal:  Positive for back pain.       Objective:   Physical Exam NAD.  Alert, oriented.  Lungs clear.  Heart regular rate rhythm.  Pain begins in the lower thoracic area and follows a dermatomal line into the anterior axillary area.  No rash is noted.  Gait normal but slow when getting up from a seated position. CT renal stone study 05/02/2023 showed no abnormality related to his flank pain.  Labs from ED: Results for orders placed or performed during the hospital encounter of 05/02/23  CBC with Differential  Result Value Ref Range   WBC 10.6 (H) 4.0 - 10.5 K/uL   RBC 5.15 4.22 - 5.81 MIL/uL   Hemoglobin 14.6 13.0 - 17.0 g/dL   HCT 52.8 41.3 - 24.4 %   MCV 79.6 (L) 80.0 - 100.0 fL   MCH 28.3 26.0 - 34.0 pg   MCHC 35.6 30.0 - 36.0 g/dL   RDW 01.0 27.2 - 53.6 %   Platelets  321 150 - 400 K/uL   nRBC 0.0 0.0 - 0.2 %   Neutrophils Relative % 56 %   Neutro Abs 6.0 1.7 - 7.7 K/uL   Lymphocytes Relative 32 %   Lymphs Abs 3.4 0.7 - 4.0 K/uL   Monocytes Relative 8 %   Monocytes Absolute 0.9 0.1 - 1.0 K/uL   Eosinophils Relative 2 %   Eosinophils Absolute 0.2 0.0 - 0.5 K/uL   Basophils Relative 0 %   Basophils Absolute 0.0 0.0 - 0.1 K/uL   Immature Granulocytes 2 %   Abs Immature Granulocytes 0.16 (H) 0.00 - 0.07 K/uL  Comprehensive metabolic panel  Result Value Ref Range   Sodium 136 135 - 145 mmol/L   Potassium 3.8 3.5 - 5.1 mmol/L   Chloride 104 98 - 111 mmol/L   CO2 22 22 - 32 mmol/L   Glucose, Bld 126 (H) 70 - 99 mg/dL   BUN 19 6 - 20 mg/dL   Creatinine, Ser 6.44 (H) 0.61 - 1.24 mg/dL   Calcium 8.8 (L) 8.9 - 10.3 mg/dL   Total Protein 7.3 6.5 - 8.1 g/dL   Albumin 4.0 3.5 - 5.0 g/dL   AST 17 15 - 41 U/L   ALT 18 0 -  44 U/L   Alkaline Phosphatase 63 38 - 126 U/L   Total Bilirubin 0.7 0.3 - 1.2 mg/dL   GFR, Estimated 57 (L) >60 mL/min   Anion gap 10 5 - 15  Urinalysis, Routine w reflex microscopic -Urine, Clean Catch  Result Value Ref Range   Color, Urine YELLOW YELLOW   APPearance CLEAR CLEAR   Specific Gravity, Urine 1.026 1.005 - 1.030   pH 5.0 5.0 - 8.0   Glucose, UA NEGATIVE NEGATIVE mg/dL   Hgb urine dipstick NEGATIVE NEGATIVE   Bilirubin Urine NEGATIVE NEGATIVE   Ketones, ur NEGATIVE NEGATIVE mg/dL   Protein, ur NEGATIVE NEGATIVE mg/dL   Nitrite NEGATIVE NEGATIVE   Leukocytes,Ua NEGATIVE NEGATIVE   RBC / HPF 0-5 0 - 5 RBC/hpf   WBC, UA 0-5 0 - 5 WBC/hpf   Bacteria, UA NONE SEEN NONE SEEN   Squamous Epithelial / HPF 0-5 0 - 5 /HPF   Mucus PRESENT    Hyaline Casts, UA PRESENT    Today's Vitals   05/09/23 1120  BP: 118/78  Pulse: 90  SpO2: 96%  Weight: 192 lb (87.1 kg)   Body mass index is 28.35 kg/m.         Assessment & Plan:   Problem List Items Addressed This Visit       Other   Acute left flank pain - Primary    Neuropathic pain   Meds ordered this encounter  Medications   predniSONE (DELTASONE) 20 MG tablet    Sig: Take 2 tabs (40 mg) po qd x 5 d    Dispense:  10 tablet    Refill:  0    Order Specific Question:   Supervising Provider    Answer:   Lilyan Punt A [[id number]]   amitriptyline (ELAVIL) 10 MG tablet    Sig: Take one tab po qhs for 6 days then if needed increase to 2 po qhs    Dispense:  30 tablet    Refill:  0    Order Specific Question:   Supervising Provider    Answer:   Babs Sciara [[id number]]   Because of patient's kidney function, will order a short course of oral prednisone.  Cautioned about elevated sugar.  Patient has insulin and sliding scale in case it is needed. Start amitriptyline 10 mg at nighttime for a few days and if needed and tolerated increase to 2 p.o. nightly. Contact office immediately if any rashes noted along this area. Continue ice/heat applications gentle stretching and lidocaine patches. Call back if symptoms worsen or persist.

## 2023-05-10 ENCOUNTER — Encounter: Payer: Self-pay | Admitting: Nurse Practitioner

## 2023-07-28 ENCOUNTER — Other Ambulatory Visit: Payer: Self-pay | Admitting: Family Medicine

## 2023-07-28 DIAGNOSIS — N1831 Chronic kidney disease, stage 3a: Secondary | ICD-10-CM

## 2023-07-28 DIAGNOSIS — I10 Essential (primary) hypertension: Secondary | ICD-10-CM

## 2023-07-28 DIAGNOSIS — Z794 Long term (current) use of insulin: Secondary | ICD-10-CM

## 2023-09-27 ENCOUNTER — Telehealth: Payer: Self-pay | Admitting: Family Medicine

## 2023-09-27 NOTE — Telephone Encounter (Signed)
Patient is wanting to know if he needs labs done for diabetic follow  up on 12/23

## 2023-10-04 ENCOUNTER — Other Ambulatory Visit: Payer: Self-pay

## 2023-10-04 DIAGNOSIS — N1831 Chronic kidney disease, stage 3a: Secondary | ICD-10-CM

## 2023-10-04 DIAGNOSIS — I10 Essential (primary) hypertension: Secondary | ICD-10-CM

## 2023-10-04 DIAGNOSIS — E782 Mixed hyperlipidemia: Secondary | ICD-10-CM

## 2023-10-04 DIAGNOSIS — Z79899 Other long term (current) drug therapy: Secondary | ICD-10-CM

## 2023-10-04 NOTE — Telephone Encounter (Signed)
Called pt and informed of labs ordered.

## 2023-10-06 ENCOUNTER — Other Ambulatory Visit: Payer: Self-pay | Admitting: Family Medicine

## 2023-10-06 ENCOUNTER — Telehealth: Payer: Self-pay

## 2023-10-06 IMAGING — CT CT ANGIO CHEST
2 of 6 series · 17 of 46 positions shown · IV contrast (Omnipaque or Isovue)
Comparison: None.

CLINICAL DATA: Chest pain with nausea vomiting. Shortness of
breath.

EXAM:
CT ANGIOGRAPHY CHEST WITH CONTRAST
TECHNIQUE: Multidetector CT imaging of the chest was performed using the
standard protocol during bolus administration of intravenous
contrast. Multiplanar CT image reconstructions and MIPs were
obtained to evaluate the vascular anatomy.

[Series 5: pe axial thins · axial · 0.82mm/px · z∈[+934,+1205]mm · 14 of 371 slices shown]
[im 16/371  lung]
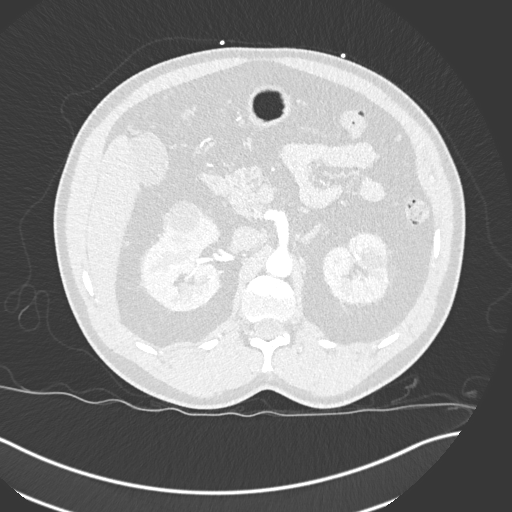
[im 47/371  soft-tissue]
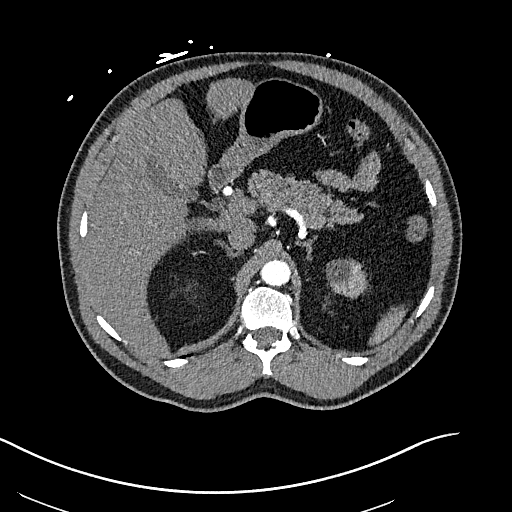
[im 78/371  lung]
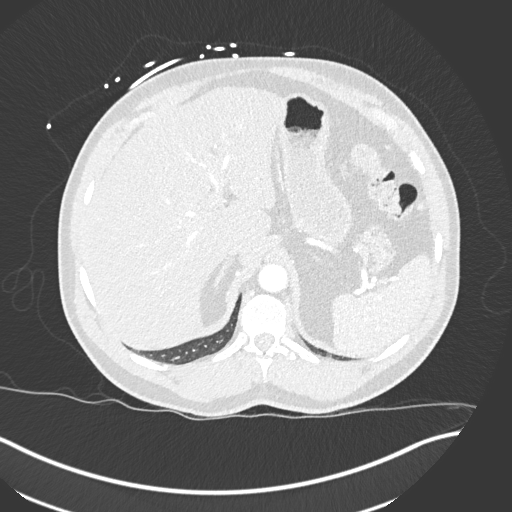
[im 93/371  soft-tissue]
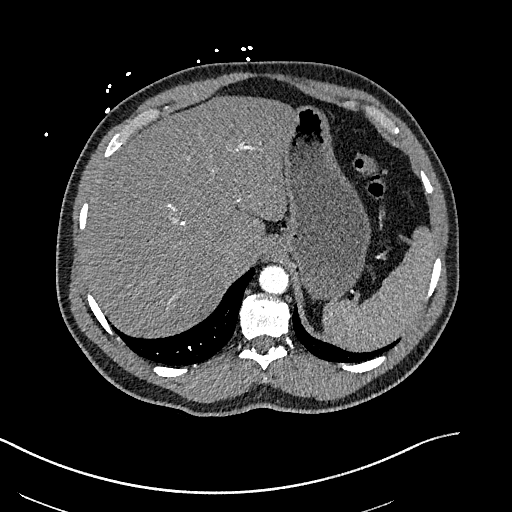
[im 124/371  lung]
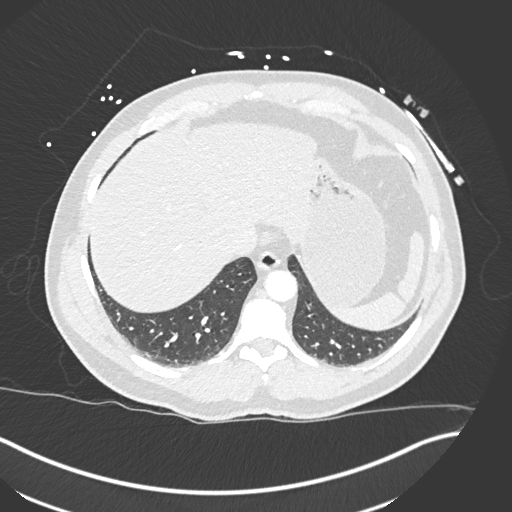
[im 155/371  soft-tissue]
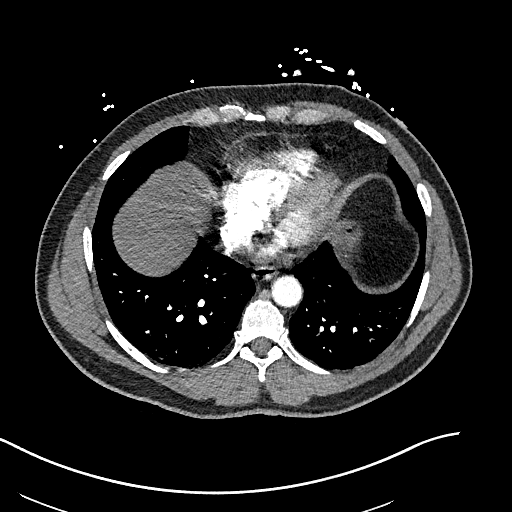
[im 170/371  lung]
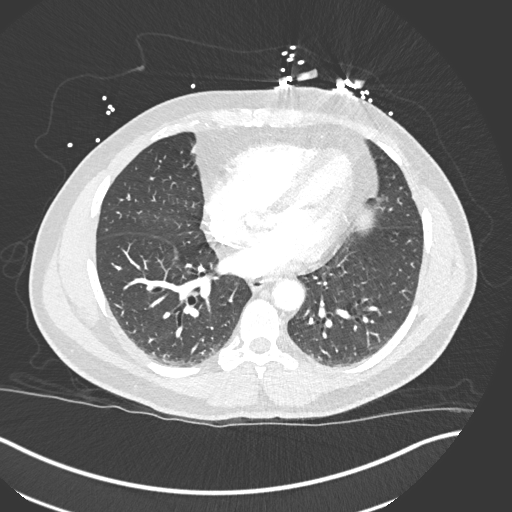
[im 201/371  soft-tissue]
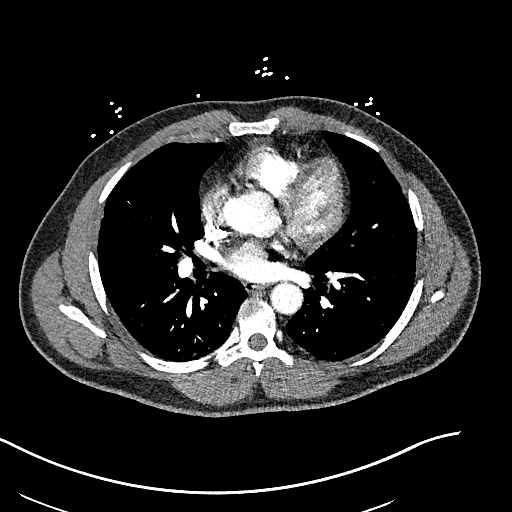
[im 216/371  lung]
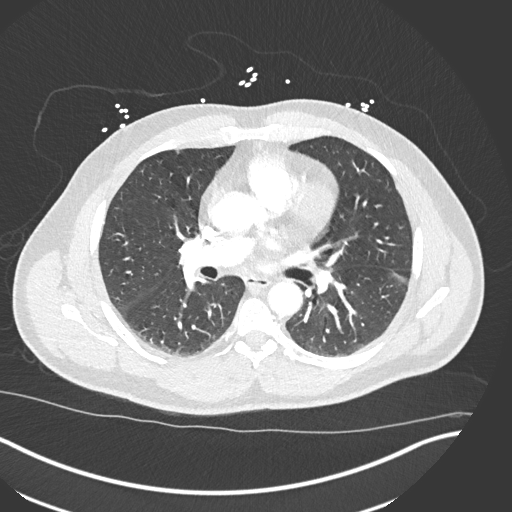
[im 247/371  soft-tissue]
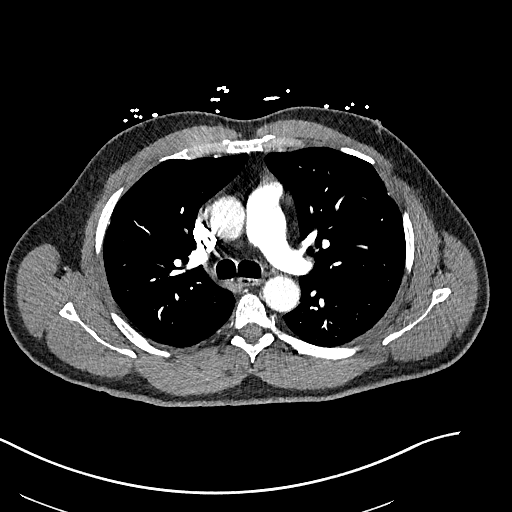
[im 278/371  lung]
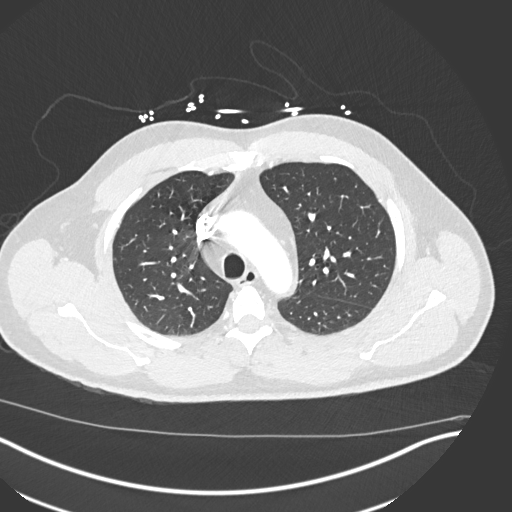
[im 293/371  soft-tissue]
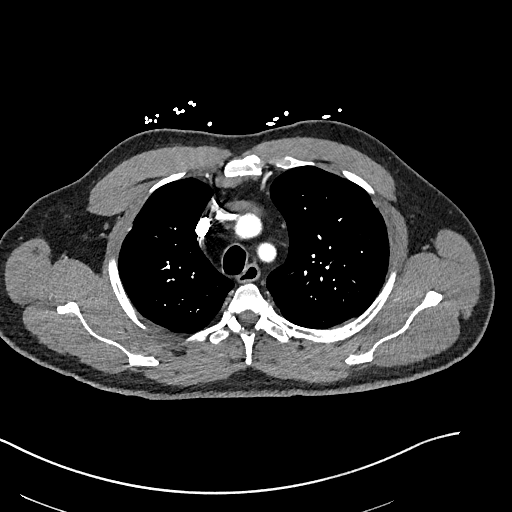
[im 324/371  lung]
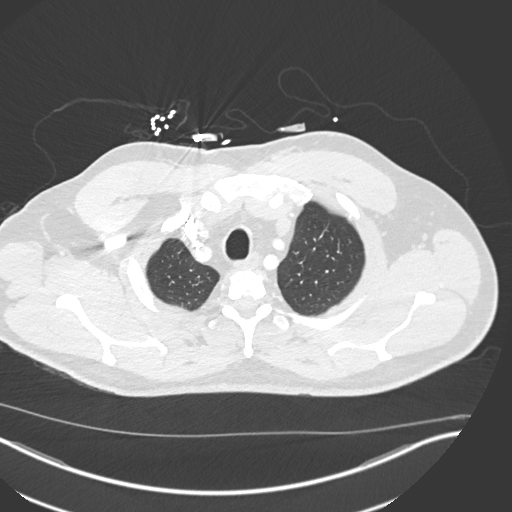
[im 355/371  soft-tissue]
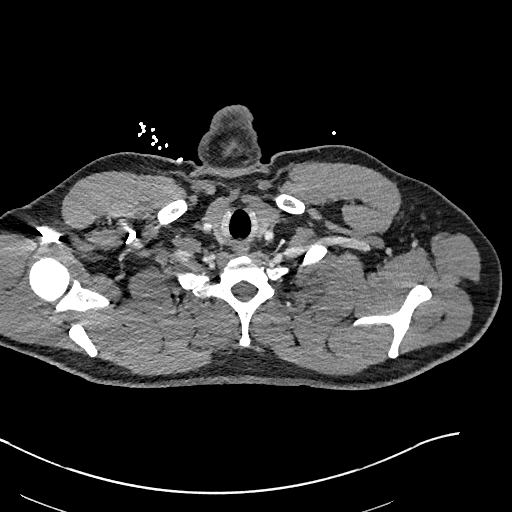

[Series 8: cor soft · coronal · 0.65mm/px · 3 of 165 slices shown]
[im 42/165  soft-tissue]
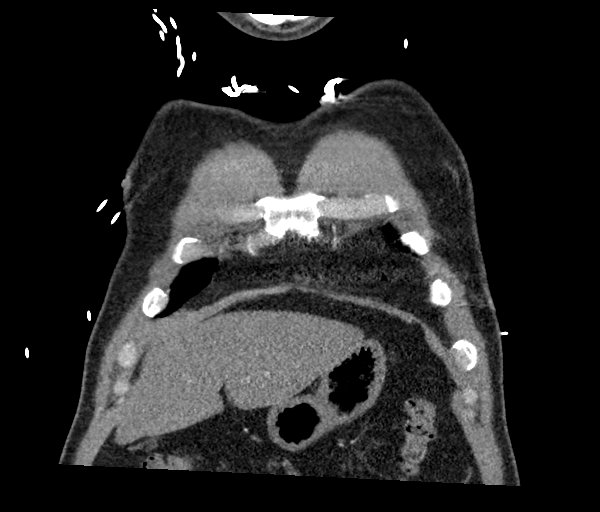
[im 83/165  soft-tissue]
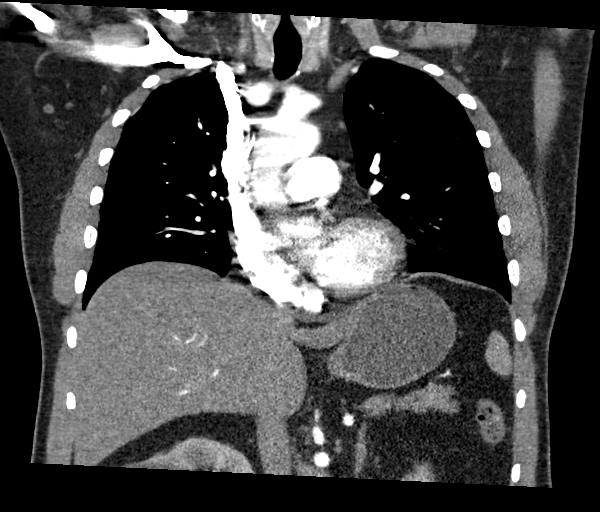
[im 124/165  soft-tissue]
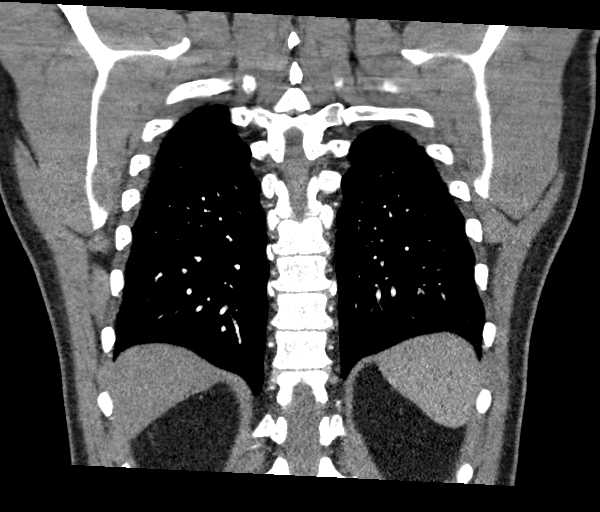

[17 of 46 positions shown; findings below may reference images not displayed]

RADIATION DOSE REDUCTION: This exam was performed according to the
departmental dose-optimization program which includes automated
exposure control, adjustment of the mA and/or kV according to
patient size and/or use of iterative reconstruction technique.

CONTRAST:  75mL OMNIPAQUE IOHEXOL 350 MG/ML SOLN
FINDINGS: Cardiovascular: The heart size is normal. No substantial pericardial
effusion. No thoracic aortic aneurysm. Bovine arch vessel anatomy
noted, normal variant. There is no filling defect within the
opacified pulmonary arteries to suggest the presence of an acute
pulmonary embolus.

Mediastinum/Nodes: No mediastinal lymphadenopathy. There is no hilar
lymphadenopathy. The esophagus has normal imaging features. There is
no axillary lymphadenopathy.

Lungs/Pleura: 4 mm anterior right upper lobe nodule seen on 64/6.
Adjacent perifissural nodules in the right lower lobe on 71/6
measure up to 5 mm. 4 mm subpleural nodule noted left lower lobe on
88/6. Several additional scattered tiny pulmonary nodules identified
measuring 4 mm or less in size. No focal airspace consolidation. No
pleural effusion.

Upper Abdomen: The liver shows diffusely decreased attenuation
suggesting fat deposition. 9 mm hypodensity in the dome of the liver
is too small to characterize but likely benign. Bilateral renal
cysts evident with large exophytic interpolar right renal lesion
incompletely visualized but compatible with the large cyst seen on
abdomen CT 05/25/2020.

Musculoskeletal: No worrisome lytic or sclerotic osseous
abnormality.

Review of the MIP images confirms the above findings.
IMPRESSION: 1. No CT evidence for acute pulmonary embolus.
2. Scattered tiny bilateral pulmonary nodules measuring up to 5 mm
in size. These are likely benign. No follow-up needed if patient is
low-risk (and has no known or suspected primary neoplasm).
Non-contrast chest CT can be considered in 12 months if patient is
high-risk. This recommendation follows the consensus statement:
Guidelines for Management of Incidental Pulmonary Nodules Detected
[DATE].
3. Hepatic steatosis.
4. Bilateral renal cysts with large exophytic interpolar right renal
lesion incompletely visualized but compatible with the large cyst
seen on abdomen CT 05/25/2020.

## 2023-10-06 IMAGING — CT CT ABD-PELV W/O CM
2 of 4 series · 16 of 46 positions shown, 18 images · non-contrast
Comparison: 05/25/2020

CLINICAL DATA: Nausea, vomiting, chest pain



[Series 2: axial st · axial · 0.82mm/px · z∈[+598,+1043]mm · 13 of 101 slices shown, 15 images]
[im 6/101  soft-tissue]
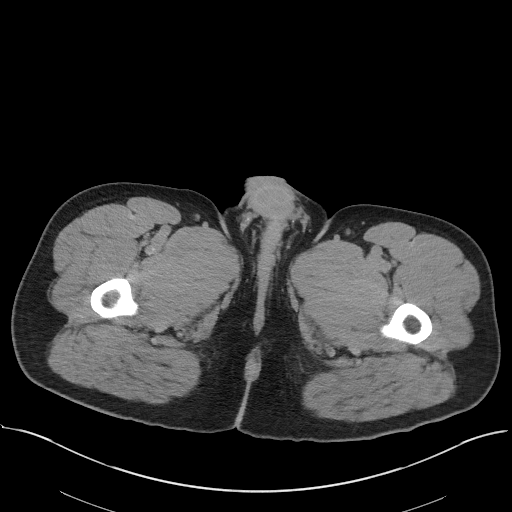
[im 6/101  bone]
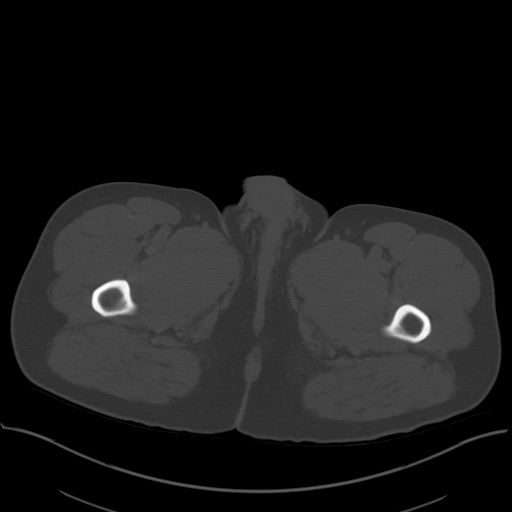
[im 12/101  soft-tissue]
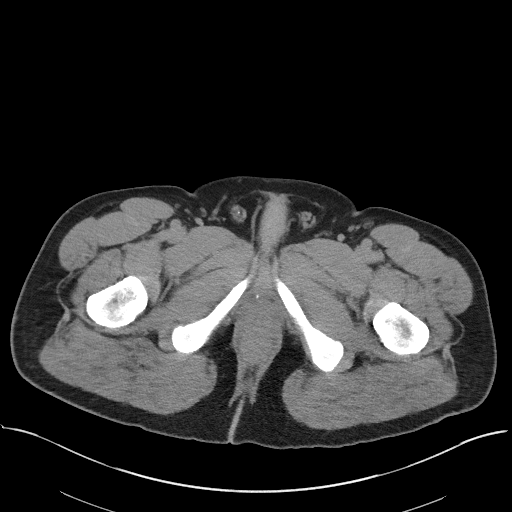
[im 24/101  soft-tissue]
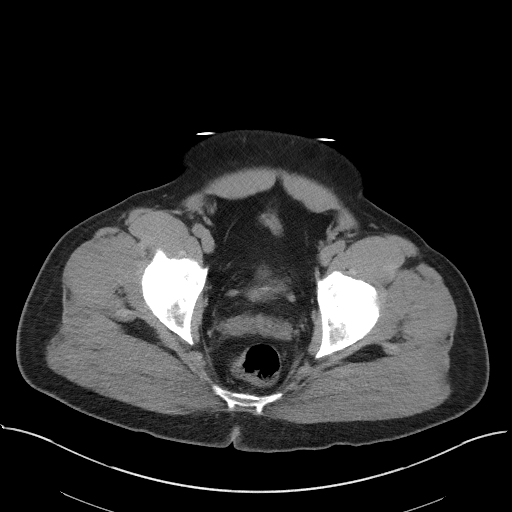
[im 30/101  soft-tissue]
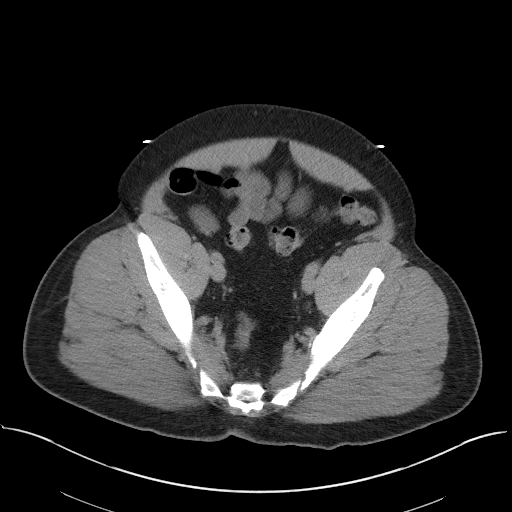
[im 36/101  soft-tissue]
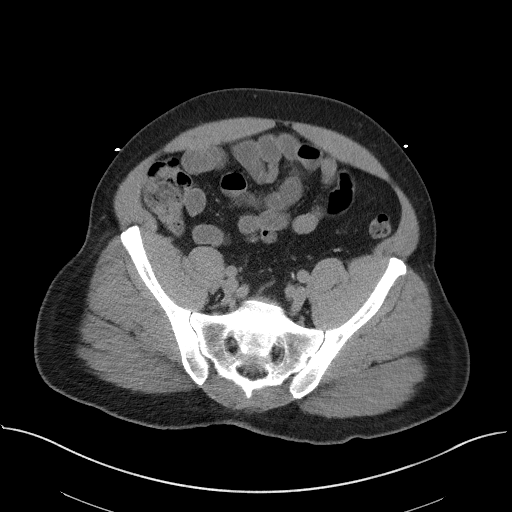
[im 42/101  soft-tissue]
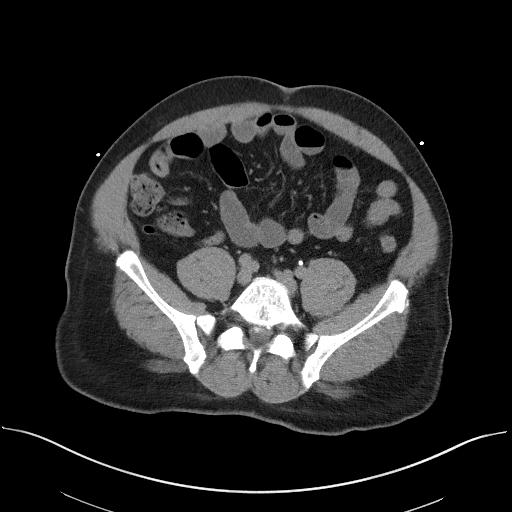
[im 53/101  soft-tissue]
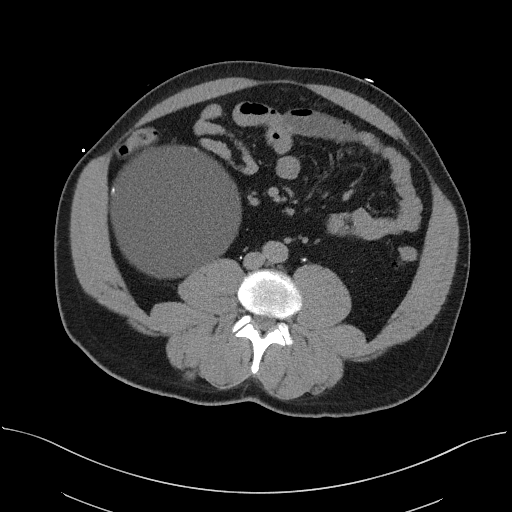
[im 59/101  soft-tissue]
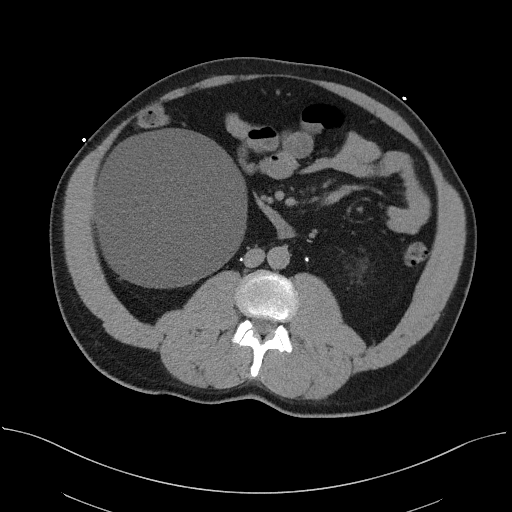
[im 65/101  soft-tissue]
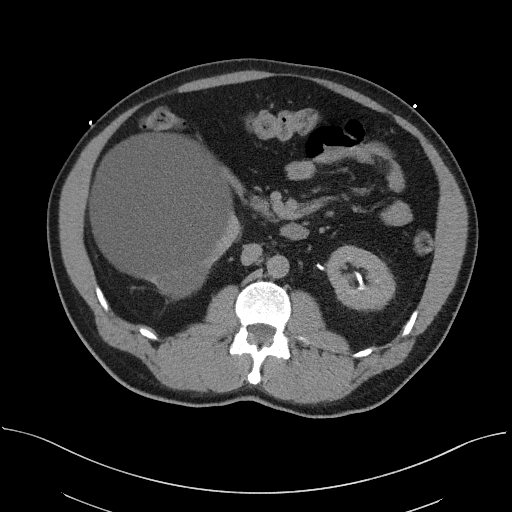
[im 65/101  bone]
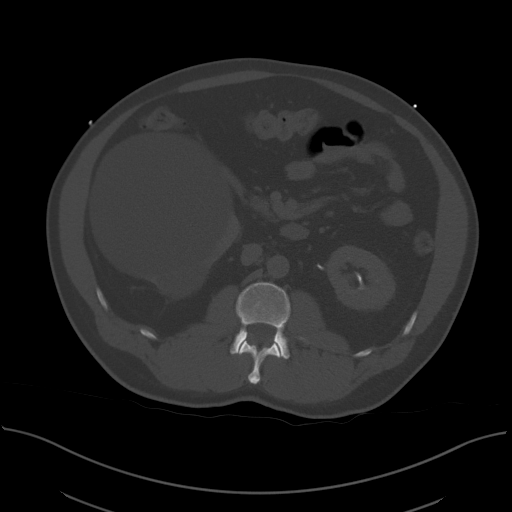
[im 71/101  soft-tissue]
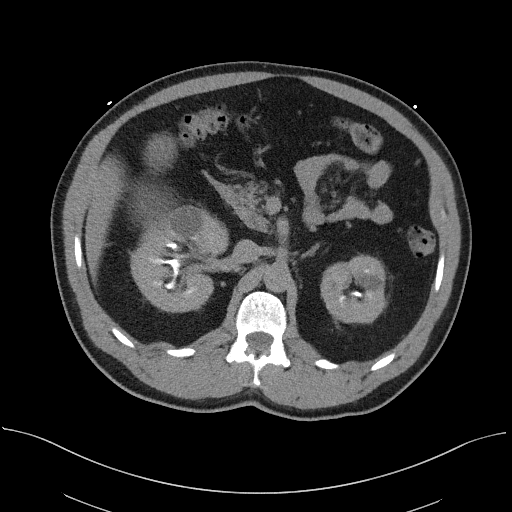
[im 77/101  soft-tissue]
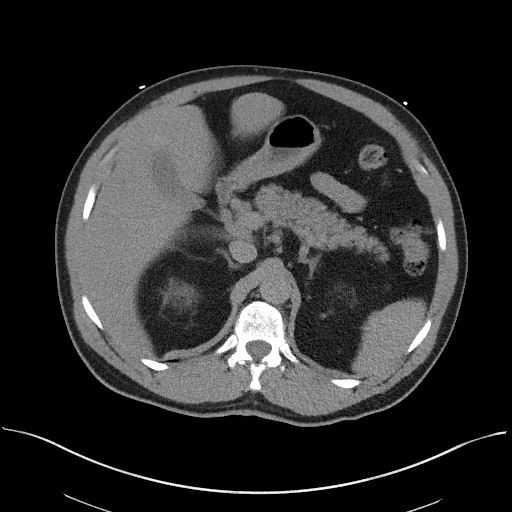
[im 89/101  soft-tissue]
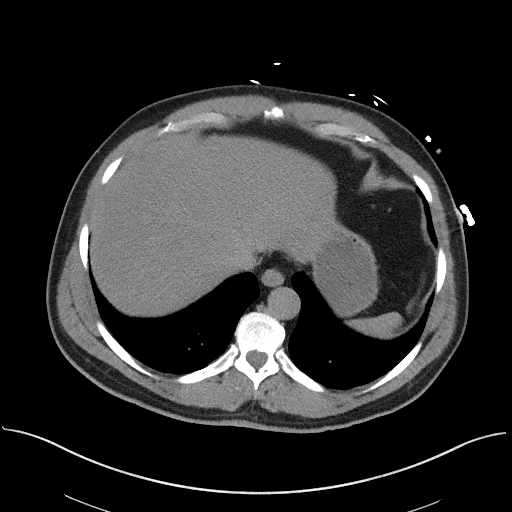
[im 95/101  soft-tissue]
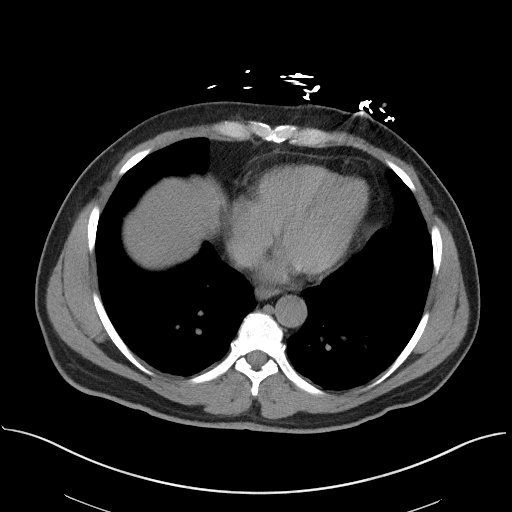

[Series 5: coronal st · coronal · 0.78mm/px · 3 of 111 slices shown]
[im 37/111  soft-tissue]
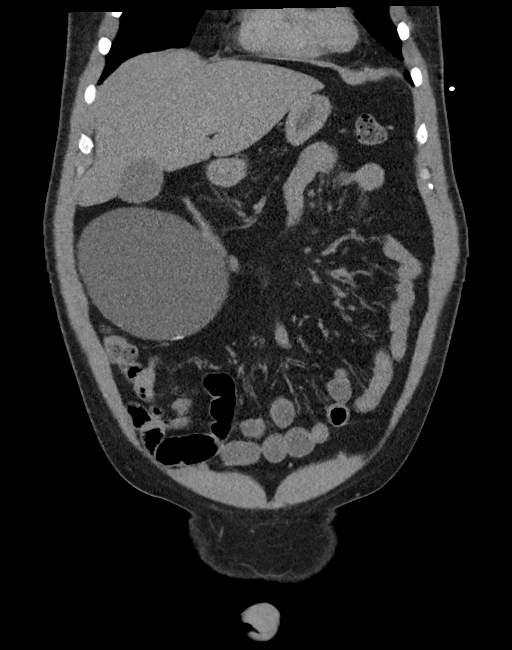
[im 49/111  soft-tissue]
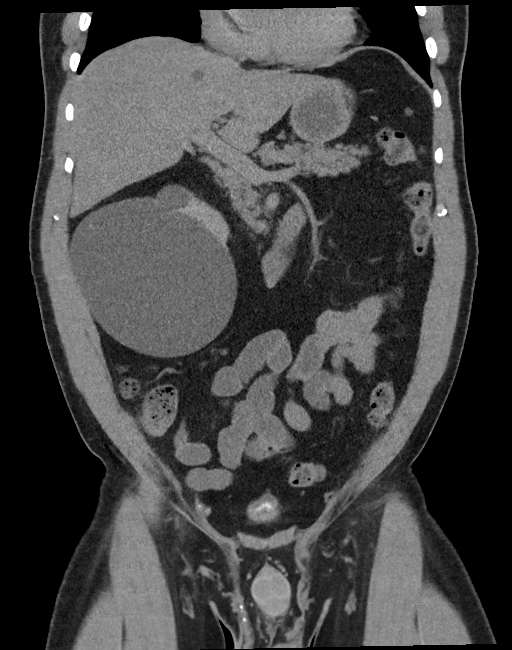
[im 62/111  soft-tissue]
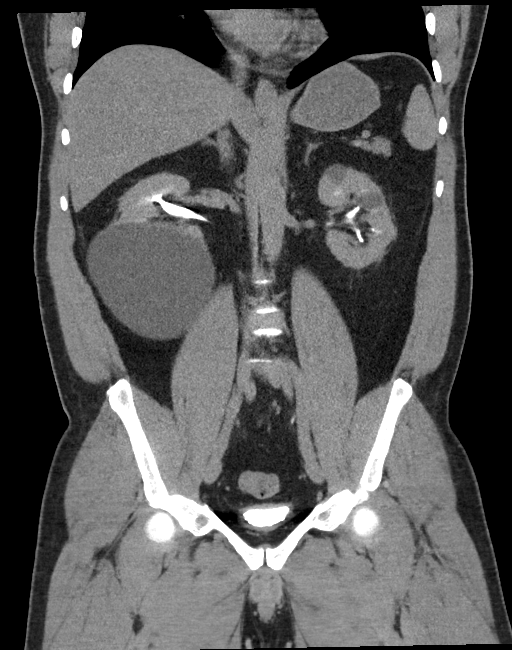

[16 of 46 positions shown; findings below may reference images not displayed]

FINDINGS: Lower chest: No acute abnormality.

Hepatobiliary: No focal liver abnormality is seen. Low-attenuation
of the liver as can be seen with hepatic steatosis. No gallstones,
gallbladder wall thickening, or biliary dilatation.

Pancreas: Unremarkable. No pancreatic ductal dilatation or
surrounding inflammatory changes.

Spleen: Normal in size without focal abnormality.

Adrenals/Urinary Tract: Adrenal glands are unremarkable. 13.3 x 13
cm right inferior pole hypodense, fluid attenuating renal mass with
small areas of mural calcification likely reflecting a mildly
complicated cyst. Multiple other smaller right renal cysts. Multiple
small hypodense fluid attenuating left renal masses consistent with
cysts unchanged in the prior examination with the largest measuring
17 mm. No obstructive uropathy. Decompressed bladder with contrast
filling the bladder.

Stomach/Bowel: Stomach is within normal limits. No evidence of bowel
wall thickening, distention, or inflammatory changes. Appendix is
normal. Diverticulosis without evidence of diverticulitis.

Vascular/Lymphatic: No significant vascular findings are present. No
enlarged abdominal or pelvic lymph nodes.

Reproductive: Prostate is unremarkable.

Other: No abdominal wall hernia or abnormality. No abdominopelvic
ascites.

Musculoskeletal: No acute osseous abnormality. No aggressive osseous
lesion.
IMPRESSION: 1. No acute abdominal or pelvic pathology.
2. Hepatic steatosis.
3. Multiple bilateral renal cysts with the largest measuring 13.3 x
13 cm arising from the inferior pole of the right kidney. No
significant interval change compared with 05/25/2020.

## 2023-10-06 NOTE — Telephone Encounter (Signed)
Called and spoke with patient and advised per Dr Adriana Simas At his last visit, his Levemir was stopped.  Patient needs to be seen by me.  He was post to be seen 3 months from his last visit.  Have not seen him since February. Patient verbalized understanding and has appointment scheduled for December with Dr Adriana Simas.

## 2023-10-06 NOTE — Telephone Encounter (Signed)
   Copied from CRM [id number]. Topic: Clinical - Medication Question >> Oct 06, 2023 11:46 AM Larwance Sachs wrote: Patient called in regarding Levemir insulin not being covered by insurance anymore and was wondering could generic or similar insulin be prescribed due to being low on insulin on hand

## 2023-10-19 ENCOUNTER — Telehealth: Payer: Self-pay | Admitting: *Deleted

## 2023-10-19 NOTE — Telephone Encounter (Signed)
Copied from CRM [id number]. Topic: General - Other >> Oct 19, 2023 11:19 AM Prudencio Pair wrote: Reason for CRM: Patient called stating that he received a call last week in regards to letting him know that his paperwork has been submitted to [employer] and that everything was fine. Checked chart and does not see any notes regarding this. Patient is also wanting to know if he will need to fast before coming in to see Dr as well. Would like for doctor or nurse to give him a call back at [phone].

## 2023-10-19 NOTE — Telephone Encounter (Signed)
Called pt and informed him that he does need to fast and that the labs have been placed.

## 2023-11-09 LAB — CBC WITH DIFFERENTIAL/PLATELET
Basophils Absolute: 0.1 10*3/uL (ref 0.0–0.2)
Basos: 1 %
EOS (ABSOLUTE): 0.6 10*3/uL — ABNORMAL HIGH (ref 0.0–0.4)
Eos: 6 %
Hematocrit: 48.4 % (ref 37.5–51.0)
Hemoglobin: 16.2 g/dL (ref 13.0–17.7)
Immature Grans (Abs): 0.1 10*3/uL (ref 0.0–0.1)
Immature Granulocytes: 1 %
Lymphocytes Absolute: 4 10*3/uL — ABNORMAL HIGH (ref 0.7–3.1)
Lymphs: 36 %
MCH: 28.2 pg (ref 26.6–33.0)
MCHC: 33.5 g/dL (ref 31.5–35.7)
MCV: 84 fL (ref 79–97)
Monocytes Absolute: 0.9 10*3/uL (ref 0.1–0.9)
Monocytes: 8 %
Neutrophils Absolute: 5.5 10*3/uL (ref 1.4–7.0)
Neutrophils: 48 %
Platelets: 404 10*3/uL (ref 150–450)
RBC: 5.75 x10E6/uL (ref 4.14–5.80)
RDW: 14.9 % (ref 11.6–15.4)
WBC: 11 10*3/uL — ABNORMAL HIGH (ref 3.4–10.8)

## 2023-11-09 LAB — LIPID PANEL
Chol/HDL Ratio: 3 {ratio} (ref 0.0–5.0)
Cholesterol, Total: 140 mg/dL (ref 100–199)
HDL: 47 mg/dL (ref >39)
LDL Chol Calc (NIH): 57 mg/dL (ref 0–99)
Triglycerides: 226 mg/dL — ABNORMAL HIGH (ref 0–149)
VLDL Cholesterol Cal: 36 mg/dL (ref 5–40)

## 2023-11-09 LAB — COMPREHENSIVE METABOLIC PANEL
ALT: 18 [IU]/L (ref 0–44)
AST: 19 [IU]/L (ref 0–40)
Albumin: 4.6 g/dL (ref 3.8–4.9)
Alkaline Phosphatase: 104 [IU]/L (ref 44–121)
BUN/Creatinine Ratio: 10 (ref 9–20)
BUN: 14 mg/dL (ref 6–24)
Bilirubin Total: 0.3 mg/dL (ref 0.0–1.2)
CO2: 24 mmol/L (ref 20–29)
Calcium: 10.4 mg/dL — ABNORMAL HIGH (ref 8.7–10.2)
Chloride: 100 mmol/L (ref 96–106)
Creatinine, Ser: 1.42 mg/dL — ABNORMAL HIGH (ref 0.76–1.27)
Globulin, Total: 3 g/dL (ref 1.5–4.5)
Glucose: 104 mg/dL — ABNORMAL HIGH (ref 70–99)
Potassium: 4.4 mmol/L (ref 3.5–5.2)
Sodium: 140 mmol/L (ref 134–144)
Total Protein: 7.6 g/dL (ref 6.0–8.5)
eGFR: 57 mL/min/{1.73_m2} — ABNORMAL LOW (ref >59)

## 2023-11-09 LAB — HEMOGLOBIN A1C
Est. average glucose Bld gHb Est-mCnc: 229 mg/dL
Hgb A1c MFr Bld: 9.6 % — ABNORMAL HIGH (ref 4.8–5.6)

## 2023-11-09 LAB — MICROALBUMIN / CREATININE URINE RATIO
Creatinine, Urine: 257.3 mg/dL
Microalb/Creat Ratio: 4 mg/g{creat} (ref 0–29)
Microalbumin, Urine: 9.8 ug/mL

## 2023-11-15 ENCOUNTER — Encounter: Payer: Self-pay | Admitting: "Endocrinology

## 2023-11-15 ENCOUNTER — Other Ambulatory Visit: Payer: Self-pay | Admitting: "Endocrinology

## 2023-11-15 ENCOUNTER — Ambulatory Visit (INDEPENDENT_AMBULATORY_CARE_PROVIDER_SITE_OTHER): Payer: 59 | Admitting: "Endocrinology

## 2023-11-15 VITALS — BP 134/84 | HR 88 | Ht 69.0 in | Wt 192.8 lb

## 2023-11-15 DIAGNOSIS — I10 Essential (primary) hypertension: Secondary | ICD-10-CM

## 2023-11-15 DIAGNOSIS — E119 Type 2 diabetes mellitus without complications: Secondary | ICD-10-CM | POA: Insufficient documentation

## 2023-11-15 DIAGNOSIS — E782 Mixed hyperlipidemia: Secondary | ICD-10-CM | POA: Diagnosis not present

## 2023-11-15 DIAGNOSIS — Z7984 Long term (current) use of oral hypoglycemic drugs: Secondary | ICD-10-CM | POA: Insufficient documentation

## 2023-11-15 MED ORDER — METFORMIN HCL 500 MG PO TABS: 500.0000 mg | ORAL_TABLET | Freq: Two times a day (BID) | ORAL | 1 refills | Status: AC

## 2023-11-15 MED ORDER — FREESTYLE LIBRE 3 READER DEVI: 1.0000 | Freq: Once | 0 refills | Status: DC | PRN

## 2023-11-15 MED ORDER — EMPAGLIFLOZIN 10 MG PO TABS: 10.0000 mg | ORAL_TABLET | Freq: Every day | ORAL | 1 refills | Status: DC

## 2023-11-15 MED ORDER — FREESTYLE LIBRE 3 PLUS SENSOR MISC: 2 refills | Status: DC

## 2023-11-15 NOTE — Progress Notes (Signed)
11/15/2023, 4:40 PM  Endocrinology follow-up note   Subjective:    Patient ID: Philip Caldwell, male    DOB: [birth year].  Philip Caldwell is being seen in follow-up after he was seen in consultation for management of currently uncontrolled symptomatic diabetes requested by  Tommie Sams, DO.   Past Medical History:  Diagnosis Date   A-fib (HCC)    Arm pain, central    with intermittent numbness   Diabetes mellitus without complication (HCC)    Hypertension    Neck pain    Renal disorder    Tendonitis     Past Surgical History:  Procedure Laterality Date   COLONOSCOPY N/A 10/08/2015   Procedure: COLONOSCOPY;  Surgeon: Malissa Hippo, MD;  Location: [facility];  Service: Endoscopy;  Laterality: N/A;  1030   CYSTOSCOPY WITH STENT PLACEMENT Left 05/25/2020   Procedure: CYSTOSCOPY WITH STENT PLACEMENT retrograde pylegram;  Surgeon: Crista Elliot, MD;  Location: [facility];  Service: Urology;  Laterality: Left;   NERVE SURGERY      Social History   Socioeconomic History   Marital status: Married    Spouse name: Not on file   Number of children: Not on file   Years of education: Not on file   Highest education level: Not on file  Occupational History   Not on file  Tobacco Use   Smoking status: Never   Smokeless tobacco: Never  Vaping Use   Vaping status: Never Used  Substance and Sexual Activity   Alcohol use: No   Drug use: No   Sexual activity: Yes    Birth control/protection: None  Other Topics Concern   Not on file  Social History Narrative   Not on file   Social Drivers of Health   Financial Resource Strain: Not on file  Food Insecurity: Not on file  Transportation Needs: Not on file  Physical Activity: Not on file  Stress: Not on file  Social Connections: Unknown (04/13/2022)   Received from [facility]   Social Network    Social Network: Not on file     Family History  Problem Relation Age of Onset   Thyroid disease Mother    Dementia Mother    Hypertension Father    Cancer Father    Heart attack Brother     Outpatient Encounter Medications as of 11/15/2023  Medication Sig   Continuous Glucose Receiver (FREESTYLE LIBRE 3 READER) DEVI 1 Piece by Does not apply route once as needed for up to 1 dose.   Continuous Glucose Sensor (FREESTYLE LIBRE 3 PLUS SENSOR) MISC Change sensor every 15 days.   empagliflozin (JARDIANCE) 10 MG TABS tablet Take 1 tablet (10 mg total) by mouth daily before breakfast.   Accu-Chek Softclix Lancets lancets Check glucose 3x per day.   amitriptyline (ELAVIL) 10 MG tablet Take one tab po qhs for 6 days then if needed increase to 2 po qhs   Ascorbic Acid (VITAMIN C PO) Take 1 tablet by mouth 3 (three) times a week.   Cholecalciferol (VITAMIN D3 PO) Take 1 tablet by mouth 3 (three) times a week.  hydrocortisone 2.5 % ointment Apply 1 application topically 2 (two) times daily as needed (rash).   Insulin Pen Needle (PEN NEEDLES) 31G X 6 MM MISC Use as directed with solostar pen.   Insulin Syringe-Needle U-100 (INSULIN SYRINGE .5CC/31GX5/16") 31G X 5/16" 0.5 ML MISC Use as directed qhs.   losartan (COZAAR) 25 MG tablet TAKE 2 TABLETS BY MOUTH EVERY DAY   metFORMIN (GLUCOPHAGE) 500 MG tablet Take 1 tablet (500 mg total) by mouth 2 (two) times daily with a meal.   methocarbamol (ROBAXIN) 500 MG tablet Take 1 tablet (500 mg total) by mouth 2 (two) times daily.   predniSONE (DELTASONE) 20 MG tablet Take 2 tabs (40 mg) po qd x 5 d   rosuvastatin (CRESTOR) 10 MG tablet TAKE 1 TABLET BY MOUTH EVERY DAY   [DISCONTINUED] metFORMIN (GLUCOPHAGE) 1000 MG tablet TAKE 1 TABLET BY MOUTH TWICE A DAY   No facility-administered encounter medications on file as of 11/15/2023.    ALLERGIES: Allergies  Allergen Reactions   Lisinopril Cough   Penicillins Rash    Has patient had a PCN reaction causing immediate rash,  facial/tongue/throat swelling, SOB or lightheadedness with hypotension: Yes Has patient had a PCN reaction causing severe rash involving mucus membranes or skin necrosis: No Has patient had a PCN reaction that required hospitalization No Has patient had a PCN reaction occurring within the last 10 years: No If all of the above answers are "NO", then may proceed with Cephalosporin use.      VACCINATION STATUS: Immunization History  Administered Date(s) Administered   Influenza,inj,Quad PF,6+ Mos 10/03/2017, 11/06/2018, 09/07/2019   Moderna Sars-Covid-2 Vaccination 01/27/2020, 02/21/2020, 10/31/2020   Pneumococcal Polysaccharide-23 11/06/2018    Diabetes He presents for his follow-up diabetic visit. He has type 2 diabetes mellitus. Onset time: he was diagnosed at approx age of 57 years. His disease course has been worsening (Patient missed his appointment since January 2023, returns with worsening glycemic profile.). There are no hypoglycemic associated symptoms. Pertinent negatives for hypoglycemia include no confusion, headaches, pallor or seizures. Associated symptoms include polydipsia and polyuria. Pertinent negatives for diabetes include no chest pain, no fatigue, no polyphagia and no weakness. There are no hypoglycemic complications. Symptoms are worsening. Diabetic complications include nephropathy. Risk factors for coronary artery disease include dyslipidemia, diabetes mellitus, hypertension and male sex. Current diabetic treatment includes insulin injections. He is compliant with treatment all of the time. His weight is fluctuating minimally. He is following a generally unhealthy diet. He has not had a previous visit with a dietitian. He participates in exercise intermittently. His home blood glucose trend is increasing steadily. His bedtime blood glucose range is generally >200 mg/dl. His overall blood glucose range is >200 mg/dl. (He presents with his meter showing average blood glucose of  201 for the last 90 days.  During his last visit, he was initiated on Levemir along with his metformin.  However, for unclear reasons, in the interim he was taken off of insulin, currently on metformin 1000 mg p.o. twice daily.  He presents with CKD stage 2-3.  His previsit labs show A1c of 9.6%.  ) An ACE inhibitor/angiotensin II receptor blocker is being taken. Eye exam is current.  Hyperlipidemia This is a chronic problem. The current episode started more than 1 year ago. The problem is uncontrolled. Exacerbating diseases include diabetes. Pertinent negatives include no chest pain, myalgias or shortness of breath. He is currently on no antihyperlipidemic treatment. Risk factors for coronary artery disease include dyslipidemia, diabetes mellitus,  hypertension and male sex.  Hypertension This is a chronic problem. The current episode started more than 1 year ago. The problem is controlled. Pertinent negatives include no chest pain, headaches, neck pain, palpitations or shortness of breath. Risk factors for coronary artery disease include dyslipidemia, diabetes mellitus and male gender. Past treatments include angiotensin blockers.     Review of Systems  Constitutional:  Negative for chills, fatigue, fever and unexpected weight change.  HENT:  Negative for dental problem, mouth sores and trouble swallowing.   Eyes:  Negative for visual disturbance.  Respiratory:  Negative for cough, choking, chest tightness, shortness of breath and wheezing.   Cardiovascular:  Negative for chest pain, palpitations and leg swelling.  Gastrointestinal:  Negative for abdominal distention, abdominal pain, constipation, diarrhea, nausea and vomiting.  Endocrine: Positive for polydipsia and polyuria. Negative for polyphagia.  Genitourinary:  Negative for dysuria, flank pain, hematuria and urgency.  Musculoskeletal:  Negative for back pain, gait problem, myalgias and neck pain.  Skin:  Negative for pallor, rash and  wound.  Neurological:  Negative for seizures, syncope, weakness, numbness and headaches.  Psychiatric/Behavioral: Negative.  Negative for confusion and dysphoric mood.     Objective:       11/15/2023    3:54 PM 05/09/2023   11:20 AM 05/03/2023   12:00 AM  Vitals with BMI  Height 5\' 9"     Weight 192 lbs 13 oz 192 lbs   BMI 28.46 28.34   Systolic 134 118 161  Diastolic 84 78 81  Pulse 88 90 68    BP 134/84   Pulse 88   Ht 5\' 9"  (1.753 m)   Wt 192 lb 12.8 oz (87.5 kg)   BMI 28.47 kg/m   Wt Readings from Last 3 Encounters:  11/15/23 192 lb 12.8 oz (87.5 kg)  05/09/23 192 lb (87.1 kg)  05/02/23 187 lb 6.3 oz (85 kg)       CMP ( most recent) CMP     Component Value Date/Time   NA 140 11/08/2023 1621   K 4.4 11/08/2023 1621   CL 100 11/08/2023 1621   CO2 24 11/08/2023 1621   GLUCOSE 104 (H) 11/08/2023 1621   GLUCOSE 126 (H) 05/02/2023 2345   BUN 14 11/08/2023 1621   CREATININE 1.42 (H) 11/08/2023 1621   CREATININE 1.22 09/09/2014 0910   CALCIUM 10.4 (H) 11/08/2023 1621   PROT 7.6 11/08/2023 1621   ALBUMIN 4.6 11/08/2023 1621   AST 19 11/08/2023 1621   ALT 18 11/08/2023 1621   ALKPHOS 104 11/08/2023 1621   BILITOT 0.3 11/08/2023 1621   GFRNONAA 57 (L) 05/02/2023 2345   GFRAA 57 (L) 11/18/2020 0830     Diabetic Labs (most recent): Lab Results  Component Value Date   HGBA1C 9.6 (H) 11/08/2023   HGBA1C 7.5 (H) 12/24/2022   HGBA1C 11.0 (H) 09/29/2022     Lipid Panel ( most recent) Lipid Panel     Component Value Date/Time   CHOL 140 11/08/2023 1621   TRIG 226 (H) 11/08/2023 1621   HDL 47 11/08/2023 1621   CHOLHDL 3.0 11/08/2023 1621   CHOLHDL 3.1 09/09/2014 0910   VLDL 17 09/09/2014 0910   LDLCALC 57 11/08/2023 1621   LABVLDL 36 11/08/2023 1621      Lab Results  Component Value Date   TSH 2.440 12/10/2021   TSH 1.878 10/01/2014   FREET4 1.15 12/10/2021      Assessment & Plan:   1. Type 2 diabetes mellitus without complication,  without  long-term current use of insulin (HCC)  - Philip Caldwell has currently uncontrolled symptomatic type 2 DM since  57 years of age.  He presents with his meter showing average blood glucose of 201 for the last 90 days.  During his last visit, he was initiated on Levemir along with his metformin.  However, for unclear reasons, in the interim he was taken off of insulin, currently on metformin 1000 mg p.o. twice daily.  He presents with CKD stage 2-3.  His previsit labs show A1c of 9.6%.  - Recent labs reviewed. - I had a long discussion with him about the progressive nature of diabetes and the pathology behind its complications. - He denies any gross complications of diabetes , however he  remains at a high risk for more acute and chronic complications which include CAD, CVA, CKD, retinopathy, and neuropathy. These are all discussed in detail with him.  - I have counseled him on diet  and weight management  by adopting a carbohydrate restricted/protein rich diet. Patient is encouraged to switch to  unprocessed or minimally processed     complex starch and increased protein intake (animal or plant source), fruits, and vegetables. -  he is advised to stick to a routine mealtimes to eat 3 meals  a day and avoid unnecessary snacks ( to snack only to correct hypoglycemia).   - he acknowledges that there is a room for improvement in his food and drink choices. - Suggestion is made for him to avoid simple carbohydrates  from his diet including Cakes, Sweet Desserts, Ice Cream, Soda (diet and regular), Sweet Tea, Candies, Chips, Cookies, Store Bought Juices, Alcohol , Artificial Sweeteners,  Coffee Creamer, and "Sugar-free" Products, Lemonade. This will help patient to have more stable blood glucose profile and potentially avoid unintended weight gain.  The following Lifestyle Medicine recommendations according to American College of Lifestyle Medicine  [facility]) were discussed and and offered to patient and he   agrees to start the journey:  A. Whole Foods, Plant-Based Nutrition comprising of fruits and vegetables, plant-based proteins, whole-grain carbohydrates was discussed in detail with the patient.   A list for source of those nutrients were also provided to the patient.  Patient will use only water or unsweetened tea for hydration. B.  The need to stay away from risky substances including alcohol, smoking; obtaining 7 to 9 hours of restorative sleep, at least 150 minutes of moderate intensity exercise weekly, the importance of healthy social connections,  and stress management techniques were discussed. C.  A full color page of  Calorie density of various food groups per pound showing examples of each food groups was provided to the patient.    - he will be scheduled with Norm Salt, RDN, CDE for diabetes education.  - I have approached him with the following individualized plan to manage  his diabetes and patient agrees:   -He has not engaged in lifestyle medicine.  In the interim, he was taken off of insulin due to the fact that he was " doing good". -He may need insulin treatment initiation, after his monitoring commitment is assured.  He may benefit from a CGM.  I discussed and prescribed the freestyle libre device for him.  If he does not get coverage, he is advised to monitor blood glucose 4 times a day-before meals and at bedtime and return in 3 weeks with his meter and logs.  -In the meantime, I discussed and added Jardiance 10 mg  p.o. daily at breakfast.  Due to CKD, he is advised to lower metformin to 500 mg p.o. twice daily.  - he is encouraged to call clinic for blood glucose levels less than 70 or above 200 mg /dl.  - he will be considered for incretin therapy as appropriate next visit.  - Specific targets for  A1c;  LDL, HDL,  and Triglycerides were discussed with the patient.  2) Blood Pressure /Hypertension: His blood pressure is controlled to target.  he is advised to  continue his current medications including losartan 25 mg p.o. daily with breakfast .  3) Lipids/Hyperlipidemia: He does not have recent lipid panel to review.  He is currently on Crestor 10 mg p.o. nightly.  Advised to continue.   4)  Weight/Diet:  Body mass index is 28.47 kg/m.  -   clearly complicating his diabetes care.   he is  a candidate for weight loss. I discussed with him the fact that loss of 5 - 10% of his  current body weight will have the most impact on his diabetes management.  Exercise, and detailed carbohydrates information provided  -  detailed on discharge instructions.  5) Chronic Care/Health Maintenance:  -he  is on ACEI/ARB  medications and  is encouraged to initiate and continue to follow up with Ophthalmology, Dentist,  Podiatrist at least yearly or according to recommendations, and advised to   stay away from smoking. I have recommended yearly flu vaccine and pneumonia vaccine at least every 5 years; moderate intensity exercise for up to 150 minutes weekly; and  sleep for at least 7 hours a day.  - he is  advised to maintain close follow up with Tommie Sams, DO for primary care needs, as well as his other providers for optimal and coordinated care.   I spent  26  minutes in the care of the patient today including review of labs from CMP, Lipids, Thyroid Function, Hematology (current and previous including abstractions from other facilities); face-to-face time discussing  his blood glucose readings/logs, discussing hypoglycemia and hyperglycemia episodes and symptoms, medications doses, his options of short and long term treatment based on the latest standards of care / guidelines;  discussion about incorporating lifestyle medicine;  and documenting the encounter. Risk reduction counseling performed per USPSTF guidelines to reduce  cardiovascular risk factors.     Please refer to Patient Instructions for Blood Glucose Monitoring and Insulin/Medications Dosing Guide"  in  media tab for additional information. Please  also refer to " Patient Self Inventory" in the Media  tab for reviewed elements of pertinent patient history.  Nelva Nay participated in the discussions, expressed understanding, and voiced agreement with the above plans.  All questions were answered to his satisfaction. he is encouraged to contact clinic should he have any questions or concerns prior to his return visit.   Follow up plan: - Return in about 3 weeks (around 12/06/2023) for F/U with Meter/CGM Megan Salon Only - no Labs.  Marquis Lunch, MD [facility] [address] [place], [place] [address] Phone: [phone]  Fax: [phone]    11/15/2023, 4:40 PM  This note was partially dictated with voice recognition software. Similar sounding words can be transcribed inadequately or may not  be corrected upon review.

## 2023-11-15 NOTE — Patient Instructions (Signed)

## 2023-11-16 ENCOUNTER — Telehealth: Payer: Self-pay

## 2023-11-16 ENCOUNTER — Telehealth: Payer: Self-pay | Admitting: *Deleted

## 2023-11-16 ENCOUNTER — Other Ambulatory Visit (HOSPITAL_COMMUNITY): Payer: Self-pay

## 2023-11-16 ENCOUNTER — Other Ambulatory Visit: Payer: Self-pay | Admitting: *Deleted

## 2023-11-16 ENCOUNTER — Other Ambulatory Visit: Payer: Self-pay

## 2023-11-16 DIAGNOSIS — E1165 Type 2 diabetes mellitus with hyperglycemia: Secondary | ICD-10-CM

## 2023-11-16 DIAGNOSIS — E119 Type 2 diabetes mellitus without complications: Secondary | ICD-10-CM

## 2023-11-16 DIAGNOSIS — Z7984 Long term (current) use of oral hypoglycemic drugs: Secondary | ICD-10-CM

## 2023-11-16 MED ORDER — DEXCOM G7 RECEIVER DEVI: 1.0000 | Freq: Once | 0 refills | Status: AC

## 2023-11-16 MED ORDER — DEXCOM G7 SENSOR MISC: 1.0000 | 3 refills | Status: DC

## 2023-11-16 NOTE — Telephone Encounter (Signed)
Pharmacy Patient Advocate Encounter   Received notification from Pt Calls Messages that prior authorization for Freestyle libre 3 plus is required/requested.   Insurance verification completed.   The patient is insured through [facility] ADVANTAGE/RX ADVANCE .   Per test claim:  Dexcom  is preferred by the insurance.  If suggested medication is appropriate, Please send in a new RX and discontinue this one. If not, please advise as to why it's not appropriate so that we may request a Prior Authorization. Please note, some preferred medications may still require a PA   Per test claim: Must use Dexcom

## 2023-11-16 NOTE — Telephone Encounter (Signed)
Rx for Dexcom G7 CGM system sent to pharmacy today.

## 2023-11-16 NOTE — Telephone Encounter (Signed)
Patient was called and made aware. 

## 2023-11-16 NOTE — Telephone Encounter (Signed)
Patient will need to have a PA for the [facility].this was sent over to the RX PA team.

## 2023-11-16 NOTE — Telephone Encounter (Signed)
Patient's insurance will not cover the [facility] and sensors. A prescription for the [facility] Receiver and Sensors have been sent in and the patient was made aware.

## 2023-11-21 ENCOUNTER — Encounter (INDEPENDENT_AMBULATORY_CARE_PROVIDER_SITE_OTHER): Payer: Self-pay | Admitting: *Deleted

## 2023-11-21 ENCOUNTER — Encounter: Payer: Self-pay | Admitting: Family Medicine

## 2023-11-21 ENCOUNTER — Ambulatory Visit: Payer: 59 | Admitting: Family Medicine

## 2023-11-21 VITALS — BP 107/73 | HR 87 | Temp 98.5°F | Ht 69.0 in | Wt 184.4 lb

## 2023-11-21 DIAGNOSIS — E1122 Type 2 diabetes mellitus with diabetic chronic kidney disease: Secondary | ICD-10-CM | POA: Diagnosis not present

## 2023-11-21 DIAGNOSIS — Z1211 Encounter for screening for malignant neoplasm of colon: Secondary | ICD-10-CM

## 2023-11-21 DIAGNOSIS — Z794 Long term (current) use of insulin: Secondary | ICD-10-CM

## 2023-11-21 DIAGNOSIS — E782 Mixed hyperlipidemia: Secondary | ICD-10-CM

## 2023-11-21 DIAGNOSIS — I10 Essential (primary) hypertension: Secondary | ICD-10-CM

## 2023-11-21 DIAGNOSIS — N1831 Chronic kidney disease, stage 3a: Secondary | ICD-10-CM

## 2023-11-21 MED ORDER — ROSUVASTATIN CALCIUM 10 MG PO TABS: 10.0000 mg | ORAL_TABLET | Freq: Every day | ORAL | 3 refills | Status: AC

## 2023-11-21 NOTE — Assessment & Plan Note (Signed)
Colonoscopy is overdue.  Was recommended to have colonoscopy after 7 years.  Referral placed.

## 2023-11-21 NOTE — Assessment & Plan Note (Signed)
Continue close follow-up with Endo.  Recommend GLP-1 or insulin.

## 2023-11-21 NOTE — Patient Instructions (Signed)
Continue your current medications.  Follow up in 3 months.  Take care  Dr. [name]

## 2023-11-21 NOTE — Assessment & Plan Note (Signed)
Stable on losartan. Continue.

## 2023-11-21 NOTE — Assessment & Plan Note (Signed)
LDL at goal on Crestor.  Continue.

## 2023-11-21 NOTE — Progress Notes (Signed)
Subjective:  Patient ID: Philip Caldwell, male    DOB: [birth year]  Age: 56 y.o. MRN: [id number]  CC: Follow-up   HPI:  57 year old male with hypertension, type 2 diabetes, CKD, hyperlipidemia presents for follow-up.  Patient reports that overall he is feeling well.  No complaints at this time.  He does report that he believes that he is overdue for colonoscopy.  Was last done in 2016.  Patient now seeing endocrinology regarding uncontrolled type 2 diabetes.  He has continuous glucometer.  He is currently on metformin and Jardiance.  Will likely need additional pharmacotherapy if unable to get this down with lifestyle changes.  Would be a candidate for GLP-1 medication.  Hypertension well-controlled on losartan.  LDL at goal on Crestor.   Patient Active Problem List   Diagnosis Date Noted   Encounter for screening colonoscopy 11/21/2023   CKD (chronic kidney disease) stage 3, GFR 30-59 ml/min (HCC) 01/04/2022   Mixed hyperlipidemia 12/28/2021   Type 2 diabetes mellitus with diabetic chronic kidney disease (HCC) 11/06/2018   Essential hypertension, benign 10/14/2014    Social Hx   Social History   Socioeconomic History   Marital status: Married    Spouse name: Not on file   Number of children: Not on file   Years of education: Not on file   Highest education level: Not on file  Occupational History   Not on file  Tobacco Use   Smoking status: Never   Smokeless tobacco: Never  Vaping Use   Vaping status: Never Used  Substance and Sexual Activity   Alcohol use: No   Drug use: No   Sexual activity: Yes    Birth control/protection: None  Other Topics Concern   Not on file  Social History Narrative   Not on file   Social Drivers of Health   Financial Resource Strain: Not on file  Food Insecurity: Not on file  Transportation Needs: Not on file  Physical Activity: Not on file  Stress: Not on file  Social Connections: Unknown (04/13/2022)   Received from [facility]   Social Network    Social Network: Not on file    Review of Systems  Respiratory: Negative.    Cardiovascular: Negative.    Objective:  BP 107/73   Pulse 87   Temp 98.5 F (36.9 C) (Oral)   Ht 5\' 9"  (1.753 m)   Wt 184 lb 6.4 oz (83.6 kg)   SpO2 98%   BMI 27.23 kg/m      11/21/2023    8:41 AM 11/15/2023    3:54 PM 05/09/2023   11:20 AM  BP/Weight  Systolic BP 107 134 118  Diastolic BP 73 84 78  Wt. (Lbs) 184.4 192.8 192  BMI 27.23 kg/m2 28.47 kg/m2 28.35 kg/m2    Physical Exam Vitals and nursing note reviewed.  Constitutional:      General: He is not in acute distress.    Appearance: Normal appearance.  HENT:     Head: Normocephalic and atraumatic.  Eyes:     General:        Right eye: No discharge.        Left eye: No discharge.     Conjunctiva/sclera: Conjunctivae normal.  Cardiovascular:     Rate and Rhythm: Normal rate and regular rhythm.  Pulmonary:     Effort: Pulmonary effort is normal.     Breath sounds: Normal breath sounds. No wheezing, rhonchi or rales.  Feet:  Comments: Diabetic Foot Check -  Appearance - no lesions, ulcers or calluses Skin - no unusual pallor or redness Monofilament testing -  Right - Great toe, medial, central, lateral ball and posterior foot intact Left - Great toe, medial, central, lateral ball and posterior foot intact  Neurological:     Mental Status: He is alert.  Psychiatric:        Mood and Affect: Mood normal.        Behavior: Behavior normal.     Lab Results  Component Value Date   WBC 11.0 (H) 11/08/2023   HGB 16.2 11/08/2023   HCT 48.4 11/08/2023   PLT 404 11/08/2023   GLUCOSE 104 (H) 11/08/2023   CHOL 140 11/08/2023   TRIG 226 (H) 11/08/2023   HDL 47 11/08/2023   LDLCALC 57 11/08/2023   ALT 18 11/08/2023   AST 19 11/08/2023   NA 140 11/08/2023   K 4.4 11/08/2023   CL 100 11/08/2023   CREATININE 1.42 (H) 11/08/2023   BUN 14 11/08/2023   CO2 24 11/08/2023   TSH 2.440  12/10/2021   PSA 1.12 09/09/2014   INR 0.9 01/03/2022   HGBA1C 9.6 (H) 11/08/2023     Assessment & Plan:   Problem List Items Addressed This Visit       Cardiovascular and Mediastinum   Essential hypertension, benign   Stable on losartan.  Continue.      Relevant Medications   rosuvastatin (CRESTOR) 10 MG tablet     Endocrine   Type 2 diabetes mellitus with diabetic chronic kidney disease (HCC) - Primary   Continue close follow-up with Endo.  Recommend GLP-1 or insulin.      Relevant Medications   rosuvastatin (CRESTOR) 10 MG tablet     Other   Encounter for screening colonoscopy   Colonoscopy is overdue.  Was recommended to have colonoscopy after 7 years.  Referral placed.      Relevant Orders   Ambulatory referral to Gastroenterology   Mixed hyperlipidemia   LDL at goal on Crestor.  Continue.      Relevant Medications   rosuvastatin (CRESTOR) 10 MG tablet    Meds ordered this encounter  Medications   rosuvastatin (CRESTOR) 10 MG tablet    Sig: Take 1 tablet (10 mg total) by mouth daily.    Dispense:  90 tablet    Refill:  3    Follow-up:  Return in about 3 months (around 02/19/2024).  Everlene Other DO [facility]

## 2023-12-06 ENCOUNTER — Ambulatory Visit: Payer: 59 | Admitting: "Endocrinology

## 2023-12-09 ENCOUNTER — Encounter: Payer: Self-pay | Admitting: "Endocrinology

## 2023-12-09 ENCOUNTER — Ambulatory Visit (INDEPENDENT_AMBULATORY_CARE_PROVIDER_SITE_OTHER): Payer: 59 | Admitting: "Endocrinology

## 2023-12-09 VITALS — BP 102/76 | HR 96 | Ht 69.0 in | Wt 188.2 lb

## 2023-12-09 DIAGNOSIS — E119 Type 2 diabetes mellitus without complications: Secondary | ICD-10-CM

## 2023-12-09 DIAGNOSIS — I10 Essential (primary) hypertension: Secondary | ICD-10-CM | POA: Diagnosis not present

## 2023-12-09 DIAGNOSIS — E782 Mixed hyperlipidemia: Secondary | ICD-10-CM | POA: Diagnosis not present

## 2023-12-09 DIAGNOSIS — Z7984 Long term (current) use of oral hypoglycemic drugs: Secondary | ICD-10-CM

## 2023-12-09 MED ORDER — EMPAGLIFLOZIN 25 MG PO TABS: 25.0000 mg | ORAL_TABLET | Freq: Every day | ORAL | 1 refills | Status: DC

## 2023-12-09 NOTE — Progress Notes (Signed)
 12/09/2023, 9:53 AM  Endocrinology follow-up note   Subjective:    Patient ID: Philip Caldwell, male    DOB: 1966-10-08.  Philip Caldwell is being seen in follow-up after he was seen in consultation for management of currently uncontrolled symptomatic diabetes requested by  Cook, [name], DO.   Past Medical History:  Diagnosis Date   A-fib [facility])    Arm pain, central    with intermittent numbness   Diabetes mellitus without complication (HCC)    Hypertension    Neck pain    Neuropathic pain 05/09/2023   Renal disorder    Tendonitis     Past Surgical History:  Procedure Laterality Date   COLONOSCOPY N/A 10/08/2015   Procedure: COLONOSCOPY;  Surgeon: Claudis RAYMOND Rivet, MD;  Location: [facility];  Service: Endoscopy;  Laterality: N/A;  1030   CYSTOSCOPY WITH STENT PLACEMENT Left 05/25/2020   Procedure: CYSTOSCOPY WITH STENT PLACEMENT retrograde pylegram;  Surgeon: Carolee Sherwood JONETTA DOUGLAS, MD;  Location: [facility];  Service: Urology;  Laterality: Left;   NERVE SURGERY      Social History   Socioeconomic History   Marital status: Married    Spouse name: Not on file   Number of children: Not on file   Years of education: Not on file   Highest education level: Not on file  Occupational History   Not on file  Tobacco Use   Smoking status: Never   Smokeless tobacco: Never  Vaping Use   Vaping status: Never Used  Substance and Sexual Activity   Alcohol use: No   Drug use: No   Sexual activity: Yes    Birth control/protection: None  Other Topics Concern   Not on file  Social History Narrative   Not on file   Social Drivers of Health   Financial Resource Strain: Not on file  Food Insecurity: Not on file  Transportation Needs: Not on file  Physical Activity: Not on file  Stress: Not on file  Social Connections: Unknown (04/13/2022)   [facility], [facility]   Social Network     Social Network: Not on file    Family History  Problem Relation Age of Onset   Thyroid disease Mother    Dementia Mother    Hypertension Father    Cancer Father    Heart attack Brother     Outpatient Encounter Medications as of 12/09/2023  Medication Sig   Accu-Chek Softclix Lancets lancets Check glucose 3x per day.   Continuous Glucose Receiver (FREESTYLE LIBRE 3 READER) DEVI Use to monitor glucose continuously as directed   Continuous Glucose Sensor (DEXCOM G7 SENSOR) MISC Inject 1 Application into the skin as directed. Change sensor every 10 days as directed.   Continuous Glucose Sensor (FREESTYLE LIBRE 3 PLUS SENSOR) MISC Change sensor every 15 days.   empagliflozin  (JARDIANCE ) 25 MG TABS tablet Take 1 tablet (25 mg total) by mouth daily before breakfast.   hydrocortisone  2.5 % ointment Apply 1 application topically 2 (two) times daily as needed (rash).   Insulin  Pen Needle (PEN NEEDLES) 31G X 6 MM MISC Use as directed with solostar pen.  Insulin  Syringe-Needle U-100 (INSULIN  SYRINGE .5CC/31GX5/16) 31G X 5/16 0.5 ML MISC Use as directed qhs.   losartan  (COZAAR ) 25 MG tablet TAKE 2 TABLETS BY MOUTH EVERY DAY   metFORMIN  (GLUCOPHAGE ) 500 MG tablet Take 1 tablet (500 mg total) by mouth 2 (two) times daily with a meal.   rosuvastatin  (CRESTOR ) 10 MG tablet Take 1 tablet (10 mg total) by mouth daily.   [DISCONTINUED] empagliflozin  (JARDIANCE ) 10 MG TABS tablet Take 1 tablet (10 mg total) by mouth daily before breakfast.   No facility-administered encounter medications on file as of 12/09/2023.    ALLERGIES: Allergies  Allergen Reactions   Lisinopril  Cough   Penicillins Rash    Has patient had a PCN reaction causing immediate rash, facial/tongue/throat swelling, SOB or lightheadedness with hypotension: Yes Has patient had a PCN reaction causing severe rash involving mucus membranes or skin necrosis: No Has patient had a PCN reaction that required hospitalization No Has patient  had a PCN reaction occurring within the last 10 years: No If all of the above answers are NO, then may proceed with Cephalosporin use.      VACCINATION STATUS: Immunization History  Administered Date(s) Administered   Influenza,inj,Quad PF,6+ Mos 10/03/2017, 11/06/2018, 09/07/2019   Moderna Sars-Covid-2 Vaccination 01/27/2020, 02/21/2020, 10/31/2020   Pneumococcal Polysaccharide-23 11/06/2018    Diabetes He presents for his follow-up diabetic visit. He has type 2 diabetes mellitus. Onset time: he was diagnosed at approx age of 52 years. His disease course has been improving (Patient missed his appointment since January 2023, returns with worsening glycemic profile.). There are no hypoglycemic associated symptoms. Pertinent negatives for hypoglycemia include no confusion, headaches, pallor or seizures. Associated symptoms include polydipsia and polyuria. Pertinent negatives for diabetes include no chest pain, no fatigue, no polyphagia and no weakness. There are no hypoglycemic complications. Symptoms are improving. Diabetic complications include nephropathy. Risk factors for coronary artery disease include dyslipidemia, diabetes mellitus, hypertension and male sex. His weight is decreasing steadily. He is following a generally unhealthy diet. He has not had a previous visit with a dietitian. He participates in exercise intermittently. His home blood glucose trend is increasing steadily. His breakfast blood glucose range is generally 180-200 mg/dl. His lunch blood glucose range is generally 180-200 mg/dl. His dinner blood glucose range is generally 180-200 mg/dl. His bedtime blood glucose range is generally 180-200 mg/dl. His overall blood glucose range is 180-200 mg/dl. (He presents with his CGM device showing 49% time in range and 51% level 1 hyperglycemia.  His average blood glucose is 193 for the last 7 days.  He is tolerating Jardiance  10 mg along with his metformin  500 mg p.o. twice daily.   His  recent A1c was 9.6%.   ) An ACE inhibitor/angiotensin II receptor blocker is being taken. Eye exam is current.  Hyperlipidemia This is a chronic problem. The current episode started more than 1 year ago. The problem is uncontrolled. Exacerbating diseases include diabetes. Pertinent negatives include no chest pain, myalgias or shortness of breath. He is currently on no antihyperlipidemic treatment. Risk factors for coronary artery disease include dyslipidemia, diabetes mellitus, hypertension and male sex.  Hypertension This is a chronic problem. The current episode started more than 1 year ago. The problem is controlled. Pertinent negatives include no chest pain, headaches, neck pain, palpitations or shortness of breath. Risk factors for coronary artery disease include dyslipidemia, diabetes mellitus and male gender. Past treatments include angiotensin blockers.     Review of Systems  Constitutional:  Negative for  chills, fatigue, fever and unexpected weight change.  HENT:  Negative for dental problem, mouth sores and trouble swallowing.   Eyes:  Negative for visual disturbance.  Respiratory:  Negative for cough, choking, chest tightness, shortness of breath and wheezing.   Cardiovascular:  Negative for chest pain, palpitations and leg swelling.  Gastrointestinal:  Negative for abdominal distention, abdominal pain, constipation, diarrhea, nausea and vomiting.  Endocrine: Positive for polydipsia and polyuria. Negative for polyphagia.  Genitourinary:  Negative for dysuria, flank pain, hematuria and urgency.  Musculoskeletal:  Negative for back pain, gait problem, myalgias and neck pain.  Skin:  Negative for pallor, rash and wound.  Neurological:  Negative for seizures, syncope, weakness, numbness and headaches.  Psychiatric/Behavioral: Negative.  Negative for confusion and dysphoric mood.     Objective:       12/09/2023    9:07 AM 11/21/2023    8:41 AM 11/15/2023    3:54 PM  Vitals with  BMI  Height 5' 9 5' 9 5' 9  Weight 188 lbs 3 oz 184 lbs 6 oz 192 lbs 13 oz  BMI 27.78 27.22 28.46  Systolic 102 107 865  Diastolic 76 73 84  Pulse 96 87 88    BP 102/76   Pulse 96   Ht 5' 9 (1.753 m)   Wt 188 lb 3.2 oz (85.4 kg)   BMI 27.79 kg/m   Wt Readings from Last 3 Encounters:  12/09/23 188 lb 3.2 oz (85.4 kg)  11/21/23 184 lb 6.4 oz (83.6 kg)  11/15/23 192 lb 12.8 oz (87.5 kg)       CMP ( most recent) CMP     Component Value Date/Time   NA 140 11/08/2023 1621   K 4.4 11/08/2023 1621   CL 100 11/08/2023 1621   CO2 24 11/08/2023 1621   GLUCOSE 104 (H) 11/08/2023 1621   GLUCOSE 126 (H) 05/02/2023 2345   BUN 14 11/08/2023 1621   CREATININE 1.42 (H) 11/08/2023 1621   CREATININE 1.22 09/09/2014 0910   CALCIUM  10.4 (H) 11/08/2023 1621   PROT 7.6 11/08/2023 1621   ALBUMIN 4.6 11/08/2023 1621   AST 19 11/08/2023 1621   ALT 18 11/08/2023 1621   ALKPHOS 104 11/08/2023 1621   BILITOT 0.3 11/08/2023 1621   GFRNONAA 57 (L) 05/02/2023 2345   GFRAA 57 (L) 11/18/2020 0830     Diabetic Labs (most recent): Lab Results  Component Value Date   HGBA1C 9.6 (H) 11/08/2023   HGBA1C 7.5 (H) 12/24/2022   HGBA1C 11.0 (H) 09/29/2022     Lipid Panel ( most recent) Lipid Panel     Component Value Date/Time   CHOL 140 11/08/2023 1621   TRIG 226 (H) 11/08/2023 1621   HDL 47 11/08/2023 1621   CHOLHDL 3.0 11/08/2023 1621   CHOLHDL 3.1 09/09/2014 0910   VLDL 17 09/09/2014 0910   LDLCALC 57 11/08/2023 1621   LABVLDL 36 11/08/2023 1621      Lab Results  Component Value Date   TSH 2.440 12/10/2021   TSH 1.878 10/01/2014   FREET4 1.15 12/10/2021      Assessment & Plan:   1. Type 2 diabetes mellitus without complication, without long-term current use of insulin  (HCC)  - Philip Caldwell has currently uncontrolled symptomatic type 2 DM since  58 years of age.  He presents with his CGM device showing 49% time in range and 51% level 1 hyperglycemia.  His average blood  glucose is 193 for the last 7 days.  He  is tolerating Jardiance  10 mg along with his metformin  500 mg p.o. twice daily.   His recent A1c was 9.6%.  - Recent labs reviewed. - I had a long discussion with him about the progressive nature of diabetes and the pathology behind its complications. - He denies any gross complications of diabetes , however he  remains at a high risk for more acute and chronic complications which include CAD, CVA, CKD, retinopathy, and neuropathy. These are all discussed in detail with him.  - I have counseled him on diet  and weight management  by adopting a carbohydrate restricted/protein rich diet. Patient is encouraged to switch to  unprocessed or minimally processed     complex starch and increased protein intake (animal or plant source), fruits, and vegetables. -  he is advised to stick to a routine mealtimes to eat 3 meals  a day and avoid unnecessary snacks ( to snack only to correct hypoglycemia).   - he acknowledges that there is a room for improvement in his food and drink choices. - Suggestion is made for him to avoid simple carbohydrates  from his diet including Cakes, Sweet Desserts, Ice Cream, Soda (diet and regular), Sweet Tea, Candies, Chips, Cookies, Store Bought Juices, Alcohol , Artificial Sweeteners,  Coffee Creamer, and Sugar-free Products, Lemonade. This will help patient to have more stable blood glucose profile and potentially avoid unintended weight gain.  The following Lifestyle Medicine recommendations according to American College of Lifestyle Medicine  [facility]) were discussed and and offered to patient and he  agrees to start the journey:  A. Whole Foods, Plant-Based Nutrition comprising of fruits and vegetables, plant-based proteins, whole-grain carbohydrates was discussed in detail with the patient.   A list for source of those nutrients were also provided to the patient.  Patient will use only water  or unsweetened tea for hydration. B.  The need  to stay away from risky substances including alcohol, smoking; obtaining 7 to 9 hours of restorative sleep, at least 150 minutes of moderate intensity exercise weekly, the importance of healthy social connections,  and stress management techniques were discussed. C.  A full color page of  Calorie density of various food groups per pound showing examples of each food groups was provided to the patient.    - he will be scheduled with Penny Crumpton, RDN, CDE for diabetes education.  - I have approached him with the following individualized plan to manage  his diabetes and patient agrees:   -He has not engaged in lifestyle medicine, however, responding to SGLT2 inhibitors intervention.  He will benefit from a higher dose of Jardiance .  I discussed and increase his Jardiance  to 25 mg p.o. daily at breakfast.  Side effects and precautions discussed with him.  He is advised to continue metformin  500 mg p.o. twice daily.  He is encouraged to utilize his freestyle libre 3 device continuously.   - he is encouraged to call clinic for blood glucose levels less than 70 or above 200 mg /dl.  - he will be considered for incretin therapy as appropriate next visit.  - Specific targets for  A1c;  LDL, HDL,  and Triglycerides were discussed with the patient.  2) Blood Pressure /Hypertension: His blood pressure is controlled to target.  he is advised to continue his current medications including losartan  25 mg p.o. daily with breakfast .  3) Lipids/Hyperlipidemia: He does not have recent lipid panel to review.  He is currently on Crestor  10 mg p.o. nightly.  Side effects and precautions discussed with him.    4)  Weight/Diet:  Body mass index is 27.79 kg/m.  -   clearly complicating his diabetes care.   he is  a candidate for weight loss. I discussed with him the fact that loss of 5 - 10% of his  current body weight will have the most impact on his diabetes management.  Exercise, and detailed carbohydrates  information provided  -  detailed on discharge instructions.  5) Chronic Care/Health Maintenance:  -he  is on ACEI/ARB  medications and  is encouraged to initiate and continue to follow up with Ophthalmology, Dentist,  Podiatrist at least yearly or according to recommendations, and advised to   stay away from smoking. I have recommended yearly flu vaccine and pneumonia vaccine at least every 5 years; moderate intensity exercise for up to 150 minutes weekly; and  sleep for at least 7 hours a day.  - he is  advised to maintain close follow up with Cook, [name], DO for primary care needs, as well as his other providers for optimal and coordinated care.   I spent  26  minutes in the care of the patient today including review of labs from CMP, Lipids, Thyroid Function, Hematology (current and previous including abstractions from other facilities); face-to-face time discussing  his blood glucose readings/logs, discussing hypoglycemia and hyperglycemia episodes and symptoms, medications doses, his options of short and long term treatment based on the latest standards of care / guidelines;  discussion about incorporating lifestyle medicine;  and documenting the encounter. Risk reduction counseling performed per USPSTF guidelines to reduce cardiovascular risk factors.     Please refer to Patient Instructions for Blood Glucose Monitoring and Insulin /Medications Dosing Guide  in media tab for additional information. Please  also refer to  Patient Self Inventory in the Media  tab for reviewed elements of pertinent patient history.  Philip Caldwell participated in the discussions, expressed understanding, and voiced agreement with the above plans.  All questions were answered to his satisfaction. he is encouraged to contact clinic should he have any questions or concerns prior to his return visit.    Follow up plan: - Return in about 9 weeks (around 02/10/2024) for Bring Meter/CGM Device/Logs- A1c in  Office.  Philip Earl, MD [facility] [address] [place], [place] [address] Phone: [phone]  Fax: [phone]    12/09/2023, 9:53 AM  This note was partially dictated with voice recognition software. Similar sounding words can be transcribed inadequately or may not  be corrected upon review.

## 2023-12-09 NOTE — Patient Instructions (Signed)

## 2023-12-12 ENCOUNTER — Telehealth: Payer: Self-pay | Admitting: *Deleted

## 2023-12-12 NOTE — Telephone Encounter (Signed)
 Procedure: Colonoscopy  Estimated body mass index is 27.79 kg/m as calculated from the following:   Height as of 12/09/23: 5' 9 (1.753 m).   Weight as of 12/09/23: 188 lb 3.2 oz (85.4 kg).   Have you had a colonoscopy before?  2016, Dr. Golda  Do you have family history of colon cancer?  no  Do you have a family history of polyps? no  Previous colonoscopy with polyps removed? yes  Do you have a history colorectal cancer?   no  Are you diabetic?  yes  Do you have a prosthetic or mechanical heart valve? no  Do you have a pacemaker/defibrillator?   no  Have you had endocarditis/atrial fibrillation?  no  Do you use supplemental oxygen/CPAP?  no  Have you had joint replacement within the last 12 months?  no  Do you tend to be constipated or have to use laxatives?  no   Do you have history of alcohol use? If yes, how much and how often.  no  Do you have history or are you using drugs? If yes, what do are you  using?  no  Have you ever had a stroke/heart attack?  no  Have you ever had a heart or other vascular stent placed,?no  Do you take weight loss medication? no  Do you take any blood-thinning medications such as: (Plavix, aspirin , Coumadin, Aggrenox, Brilinta, Xarelto, Eliquis, Pradaxa, Savaysa or Effient)? no  If yes we need the name, milligram, dosage and who is prescribing doctor:               Current Outpatient Medications  Medication Sig Dispense Refill   Accu-Chek Softclix Lancets lancets Check glucose 3x per day. 200 each 4   Continuous Glucose Receiver (FREESTYLE LIBRE 3 READER) DEVI Use to monitor glucose continuously as directed 1 each 0   Continuous Glucose Sensor (DEXCOM G7 SENSOR) MISC Inject 1 Application into the skin as directed. Change sensor every 10 days as directed. 9 each 3   Continuous Glucose Sensor (FREESTYLE LIBRE 3 PLUS SENSOR) MISC Change sensor every 15 days. 2 each 2   empagliflozin  (JARDIANCE ) 25 MG TABS tablet Take 1 tablet (25 mg  total) by mouth daily before breakfast. 90 tablet 1   hydrocortisone  2.5 % ointment Apply 1 application topically 2 (two) times daily as needed (rash). 20 g 4   Insulin  Pen Needle (PEN NEEDLES) 31G X 6 MM MISC Use as directed with solostar pen. 50 each 1   Insulin  Syringe-Needle U-100 (INSULIN  SYRINGE .5CC/31GX5/16) 31G X 5/16 0.5 ML MISC Use as directed qhs. 100 each 3   losartan  (COZAAR ) 25 MG tablet TAKE 2 TABLETS BY MOUTH EVERY DAY 180 tablet 1   metFORMIN  (GLUCOPHAGE ) 500 MG tablet Take 1 tablet (500 mg total) by mouth 2 (two) times daily with a meal. 180 tablet 1   rosuvastatin  (CRESTOR ) 10 MG tablet Take 1 tablet (10 mg total) by mouth daily. 90 tablet 3   No current facility-administered medications for this visit.    Allergies  Allergen Reactions   Lisinopril  Cough   Penicillins Rash    Has patient had a PCN reaction causing immediate rash, facial/tongue/throat swelling, SOB or lightheadedness with hypotension: Yes Has patient had a PCN reaction causing severe rash involving mucus membranes or skin necrosis: No Has patient had a PCN reaction that required hospitalization No Has patient had a PCN reaction occurring within the last 10 years: No If all of the above answers are NO, then  may proceed with Cephalosporin use.

## 2024-01-04 NOTE — Telephone Encounter (Signed)
 ASA 3. Hold jardiance  72 hours Day of prep: metformin  AM only, insulin  1/2 dose AM of TCS: hold metformin  and insulin  Trilyte  prep, tap water  enemas

## 2024-01-05 NOTE — Telephone Encounter (Signed)
 I can send it in to see if insurance will cover it

## 2024-01-05 NOTE — Telephone Encounter (Signed)
 Pt is refusing to schedule due to not wanting to do the Trilyte  prep. He says he can't do that prep. Can we do the miralax  prep? Please advise. Thank you

## 2024-01-05 NOTE — Telephone Encounter (Signed)
 Is suprep available to him?

## 2024-01-05 NOTE — Telephone Encounter (Signed)
 ok

## 2024-01-06 ENCOUNTER — Encounter: Payer: Self-pay | Admitting: *Deleted

## 2024-01-06 ENCOUNTER — Encounter (INDEPENDENT_AMBULATORY_CARE_PROVIDER_SITE_OTHER): Payer: Self-pay | Admitting: *Deleted

## 2024-01-06 ENCOUNTER — Other Ambulatory Visit: Payer: Self-pay | Admitting: *Deleted

## 2024-01-06 MED ORDER — NA SULFATE-K SULFATE-MG SULF 17.5-3.13-1.6 GM/177ML PO SOLN: ORAL | 0 refills | Status: DC

## 2024-01-06 NOTE — Telephone Encounter (Signed)
 [employer] PA:  CPT Code GECOL Description: Colonoscopy Case Number: [id number] Review Date: 01/06/2024 9:46:22 AM Expiration Date: N/A Status: This member is not in scope for prior-authorization/notification for the services requested. You can save the case reference ID as validation of your request.

## 2024-01-06 NOTE — Telephone Encounter (Signed)
 Pt has been scheduled for 02/24/24 with Dr.Carver. instructions mailed and prep sent to the pharmacy.

## 2024-01-06 NOTE — Telephone Encounter (Signed)
 Referral completed, TCS apt letter sent to PCP

## 2024-01-10 ENCOUNTER — Telehealth: Payer: Self-pay | Admitting: "Endocrinology

## 2024-01-10 MED ORDER — EMPAGLIFLOZIN 25 MG PO TABS: 25.0000 mg | ORAL_TABLET | Freq: Every day | ORAL | 1 refills | Status: DC

## 2024-01-10 NOTE — Telephone Encounter (Signed)
Pt states his in Jardiance was increased to 25mg  at his last appt. The RX says Fill Later. Please send to [facility]

## 2024-01-10 NOTE — Telephone Encounter (Signed)
Rx resent to pharmacy

## 2024-01-27 ENCOUNTER — Other Ambulatory Visit: Payer: Self-pay | Admitting: "Endocrinology

## 2024-01-30 ENCOUNTER — Other Ambulatory Visit: Payer: Self-pay

## 2024-01-30 ENCOUNTER — Telehealth: Payer: Self-pay | Admitting: "Endocrinology

## 2024-01-30 DIAGNOSIS — E119 Type 2 diabetes mellitus without complications: Secondary | ICD-10-CM

## 2024-01-30 MED ORDER — DEXCOM G7 SENSOR MISC: 2 refills | Status: DC

## 2024-01-30 MED ORDER — FREESTYLE LIBRE 3 SENSOR MISC
2 refills | Status: DC
Start: 2024-01-30 — End: 2024-01-30

## 2024-01-30 MED ORDER — DEXCOM G7 RECEIVER DEVI
0 refills | Status: AC
Start: 2024-01-30 — End: ?

## 2024-01-30 MED ORDER — FREESTYLE LIBRE 3 SENSOR MISC: 2 refills | Status: DC

## 2024-01-30 NOTE — Telephone Encounter (Signed)
 Spoke with pt, he stated he prefers [facility]. Rx refill for [place] sensors sent in.

## 2024-01-30 NOTE — Telephone Encounter (Signed)
 Rx for Dexcom G7 CGM system [facility] to [facility].

## 2024-01-30 NOTE — Addendum Note (Signed)
 Addended by: Derrell Lolling on: 01/30/2024 04:17 PM   Modules accepted: Orders

## 2024-01-30 NOTE — Telephone Encounter (Signed)
 Patient called and said it will be over $100 for the [place]. Please send in dxcom

## 2024-01-30 NOTE — Telephone Encounter (Signed)
 Pt called about libre 3 sensors, looks like pharmacy sent in for a change to Dexcom, pt states he is out of sensors completely.

## 2024-02-02 NOTE — Telephone Encounter (Signed)
 Patient called again and said his needs a nurse to call him about monitoring his BS levels. Libre not covered and Dexcom on back order. He does not have test strips to test his sugar.

## 2024-02-03 NOTE — Telephone Encounter (Signed)
 Spoke with pt, he stated he called several different pharmacies and each stated Dexcom is on back order. Samples of sensors given to pt.

## 2024-02-04 ENCOUNTER — Other Ambulatory Visit: Payer: Self-pay | Admitting: Family Medicine

## 2024-02-04 DIAGNOSIS — N1831 Chronic kidney disease, stage 3a: Secondary | ICD-10-CM

## 2024-02-04 DIAGNOSIS — Z794 Long term (current) use of insulin: Secondary | ICD-10-CM

## 2024-02-04 DIAGNOSIS — I10 Essential (primary) hypertension: Secondary | ICD-10-CM

## 2024-02-13 ENCOUNTER — Encounter: Payer: Self-pay | Admitting: Family Medicine

## 2024-02-14 NOTE — Telephone Encounter (Signed)
 Pt called and was questioning the dx code for his upcoming procedure. Advised pt that he has a history of polyps and therefore it will be a diagnostic procedure. Pt states he doesn't have the money to pay for the procedure. Advised him that he doesn't have to pay co-pay at the time of procedure. He states "I know that but I will have to end up paying and I don't have the money". Told pt that I couldn't change the diagnosis. He says he isn't cancelling right now but probably will end up cancelling.

## 2024-02-17 ENCOUNTER — Ambulatory Visit (INDEPENDENT_AMBULATORY_CARE_PROVIDER_SITE_OTHER): Payer: 59 | Admitting: "Endocrinology

## 2024-02-17 ENCOUNTER — Encounter: Payer: Self-pay | Admitting: "Endocrinology

## 2024-02-17 VITALS — BP 114/76 | HR 72 | Ht 69.0 in | Wt 187.4 lb

## 2024-02-17 DIAGNOSIS — Z7984 Long term (current) use of oral hypoglycemic drugs: Secondary | ICD-10-CM

## 2024-02-17 DIAGNOSIS — E782 Mixed hyperlipidemia: Secondary | ICD-10-CM

## 2024-02-17 DIAGNOSIS — E119 Type 2 diabetes mellitus without complications: Secondary | ICD-10-CM | POA: Diagnosis not present

## 2024-02-17 DIAGNOSIS — I10 Essential (primary) hypertension: Secondary | ICD-10-CM | POA: Diagnosis not present

## 2024-02-17 MED ORDER — INSULIN PEN NEEDLE 29G X 5MM MISC: 1 refills | Status: AC

## 2024-02-17 MED ORDER — TIRZEPATIDE 2.5 MG/0.5ML ~~LOC~~ SOAJ: 2.5000 mg | SUBCUTANEOUS | 1 refills | Status: DC

## 2024-02-17 NOTE — Patient Instructions (Signed)

## 2024-02-17 NOTE — Progress Notes (Signed)
 02/17/2024, 11:55 AM  Endocrinology follow-up note   Subjective:    Patient ID: Philip Caldwell, male    DOB: 17-Feb-1966.  Philip Caldwell is being seen in follow-up after he was seen in consultation for management of currently uncontrolled symptomatic diabetes requested by  Tommie Sams, DO.   Past Medical History:  Diagnosis Date   A-fib [facility])    Arm pain, central    with intermittent numbness   Diabetes mellitus without complication (HCC)    Hypertension    Neck pain    Neuropathic pain 05/09/2023   Renal disorder    Tendonitis     Past Surgical History:  Procedure Laterality Date   COLONOSCOPY N/A 10/08/2015   Procedure: COLONOSCOPY;  Surgeon: Malissa Hippo, MD;  Location: [facility];  Service: Endoscopy;  Laterality: N/A;  1030   CYSTOSCOPY WITH STENT PLACEMENT Left 05/25/2020   Procedure: CYSTOSCOPY WITH STENT PLACEMENT retrograde pylegram;  Surgeon: Crista Elliot, MD;  Location: [facility];  Service: Urology;  Laterality: Left;   NERVE SURGERY      Social History   Socioeconomic History   Marital status: Married    Spouse name: Not on file   Number of children: Not on file   Years of education: Not on file   Highest education level: Not on file  Occupational History   Not on file  Tobacco Use   Smoking status: Never   Smokeless tobacco: Never  Vaping Use   Vaping status: Never Used  Substance and Sexual Activity   Alcohol use: No   Drug use: No   Sexual activity: Yes    Birth control/protection: None  Other Topics Concern   Not on file  Social History Narrative   Not on file   Social Drivers of Health   Financial Resource Strain: Not on file  Food Insecurity: Not on file  Transportation Needs: Not on file  Physical Activity: Not on file  Stress: Not on file  Social Connections: Unknown (04/13/2022)   Received from [facility]   Social Network     Social Network: Not on file    Family History  Problem Relation Age of Onset   Thyroid disease Mother    Dementia Mother    Hypertension Father    Cancer Father    Heart attack Brother     Outpatient Encounter Medications as of 02/17/2024  Medication Sig   Insulin Pen Needle 29G X MISC Use to inject insulin daily   Na Sulfate-K Sulfate-Mg Sulfate concentrate 17.5-3.13-1.6 GM/177ML SOLN As directed   tirzepatide (MOUNJARO) 2.5 MG/0.5ML Pen Inject 2.5 mg into the skin once a week.   Accu-Chek Softclix Lancets lancets Check glucose 3x per day.   Continuous Glucose Receiver (DEXCOM G7 RECEIVER) DEVI Use to monitor glucose continuously as directed.   Continuous Glucose Sensor (DEXCOM G7 SENSOR) MISC Inject 1 Application into the skin as directed. Change sensor every 10 days as directed.   Continuous Glucose Sensor (DEXCOM G7 SENSOR) MISC Use to monitor glucose continuously as directed. Change sensor every 10 days.   empagliflozin (JARDIANCE) 25 MG TABS tablet Take 1  tablet (25 mg total) by mouth daily before breakfast.   hydrocortisone 2.5 % ointment Apply 1 application topically 2 (two) times daily as needed (rash).   losartan (COZAAR) 25 MG tablet TAKE 2 TABLETS BY MOUTH EVERY DAY   metFORMIN (GLUCOPHAGE) 500 MG tablet Take 1 tablet (500 mg total) by mouth 2 (two) times daily with a meal. (Patient taking differently: Take 250 mg by mouth 2 (two) times daily with a meal.)   rosuvastatin (CRESTOR) 10 MG tablet Take 1 tablet (10 mg total) by mouth daily.   [DISCONTINUED] Insulin Pen Needle (PEN NEEDLES) 31G X 6 MM MISC Use as directed with solostar pen.   [DISCONTINUED] Insulin Syringe-Needle U-100 (INSULIN SYRINGE .5CC/31GX5/16") 31G X 5/16" 0.5 ML MISC Use as directed qhs.   No facility-administered encounter medications on file as of 02/17/2024.    ALLERGIES: Allergies  Allergen Reactions   Lisinopril Cough   Penicillins Rash    Has patient had a PCN reaction causing immediate  rash, facial/tongue/throat swelling, SOB or lightheadedness with hypotension: Yes Has patient had a PCN reaction causing severe rash involving mucus membranes or skin necrosis: No Has patient had a PCN reaction that required hospitalization No Has patient had a PCN reaction occurring within the last 10 years: No If all of the above answers are "NO", then may proceed with Cephalosporin use.      VACCINATION STATUS: Immunization History  Administered Date(s) Administered   Influenza,inj,Quad PF,6+ Mos 10/03/2017, 11/06/2018, 09/07/2019   Moderna Sars-Covid-2 Vaccination 01/27/2020, 02/21/2020, 10/31/2020   Pneumococcal Polysaccharide-23 11/06/2018    Diabetes He presents for his follow-up diabetic visit. He has type 2 diabetes mellitus. Onset time: he was diagnosed at approx age of 52 years. His disease course has been fluctuating (Patient missed his appointment since January 2023, returns with worsening glycemic profile.). There are no hypoglycemic associated symptoms. Pertinent negatives for hypoglycemia include no confusion, headaches, pallor or seizures. Associated symptoms include polydipsia and polyuria. Pertinent negatives for diabetes include no chest pain, no fatigue, no polyphagia and no weakness. There are no hypoglycemic complications. Symptoms are improving. Diabetic complications include nephropathy. Risk factors for coronary artery disease include dyslipidemia, diabetes mellitus, hypertension and male sex. His weight is fluctuating minimally. He is following a generally unhealthy diet. He has not had a previous visit with a dietitian. He participates in exercise intermittently. His home blood glucose trend is fluctuating minimally. His breakfast blood glucose range is generally 180-200 mg/dl. His lunch blood glucose range is generally >200 mg/dl. His dinner blood glucose range is generally >200 mg/dl. His bedtime blood glucose range is generally 180-200 mg/dl. His overall blood glucose  range is 180-200 mg/dl. (He presents with his CGM device showing 40% time range, 37% Level One hyperglycemia, 23% level 2 hyperglycemia.      His average blood glucose is 204 for the last 14 days.  He is tolerating Jardiance 25 mg p.o. daily, metformin 250 mg p.o. twice daily.   His point-of-care A1c is 8.8%, slightly improving from 9.6% during last visit.      ) An ACE inhibitor/angiotensin II receptor blocker is being taken. Eye exam is current.  Hyperlipidemia This is a chronic problem. The current episode started more than 1 year ago. The problem is uncontrolled. Exacerbating diseases include diabetes. Pertinent negatives include no chest pain, myalgias or shortness of breath. He is currently on no antihyperlipidemic treatment. Risk factors for coronary artery disease include dyslipidemia, diabetes mellitus, hypertension and male sex.  Hypertension This is a chronic  problem. The current episode started more than 1 year ago. The problem is controlled. Pertinent negatives include no chest pain, headaches, neck pain, palpitations or shortness of breath. Risk factors for coronary artery disease include dyslipidemia, diabetes mellitus and male gender. Past treatments include angiotensin blockers.     Review of Systems  Constitutional:  Negative for chills, fatigue, fever and unexpected weight change.  HENT:  Negative for dental problem, mouth sores and trouble swallowing.   Eyes:  Negative for visual disturbance.  Respiratory:  Negative for cough, choking, chest tightness, shortness of breath and wheezing.   Cardiovascular:  Negative for chest pain, palpitations and leg swelling.  Gastrointestinal:  Negative for abdominal distention, abdominal pain, constipation, diarrhea, nausea and vomiting.  Endocrine: Positive for polydipsia and polyuria. Negative for polyphagia.  Genitourinary:  Negative for dysuria, flank pain, hematuria and urgency.  Musculoskeletal:  Negative for back pain, gait  problem, myalgias and neck pain.  Skin:  Negative for pallor, rash and wound.  Neurological:  Negative for seizures, syncope, weakness, numbness and headaches.  Psychiatric/Behavioral: Negative.  Negative for confusion and dysphoric mood.     Objective:       02/17/2024   11:11 AM 12/09/2023    9:07 AM 11/21/2023    8:41 AM  Vitals with BMI  Height 5\' 9"  5\' 9"  5\' 9"   Weight 187 lbs 6 oz 188 lbs 3 oz 184 lbs 6 oz  BMI 27.66 27.78 27.22  Systolic 114 102 841  Diastolic 76 76 73  Pulse 72 96 87    BP 114/76   Pulse 72   Ht 5\' 9"  (1.753 m)   Wt 187 lb 6.4 oz (85 kg)   BMI 27.67 kg/m   Wt Readings from Last 3 Encounters:  02/17/24 187 lb 6.4 oz (85 kg)  12/09/23 188 lb 3.2 oz (85.4 kg)  11/21/23 184 lb 6.4 oz (83.6 kg)       CMP ( most recent) CMP     Component Value Date/Time   NA 140 11/08/2023 1621   K 4.4 11/08/2023 1621   CL 100 11/08/2023 1621   CO2 24 11/08/2023 1621   GLUCOSE 104 (H) 11/08/2023 1621   GLUCOSE 126 (H) 05/02/2023 2345   BUN 14 11/08/2023 1621   CREATININE 1.42 (H) 11/08/2023 1621   CREATININE 1.22 09/09/2014 0910   CALCIUM 10.4 (H) 11/08/2023 1621   PROT 7.6 11/08/2023 1621   ALBUMIN 4.6 11/08/2023 1621   AST 19 11/08/2023 1621   ALT 18 11/08/2023 1621   ALKPHOS 104 11/08/2023 1621   BILITOT 0.3 11/08/2023 1621   GFRNONAA 57 (L) 05/02/2023 2345   GFRAA 57 (L) 11/18/2020 0830     Diabetic Labs (most recent): Lab Results  Component Value Date   HGBA1C 9.6 (H) 11/08/2023   HGBA1C 7.5 (H) 12/24/2022   HGBA1C 11.0 (H) 09/29/2022     Lipid Panel ( most recent) Lipid Panel     Component Value Date/Time   CHOL 140 11/08/2023 1621   TRIG 226 (H) 11/08/2023 1621   HDL 47 11/08/2023 1621   CHOLHDL 3.0 11/08/2023 1621   CHOLHDL 3.1 09/09/2014 0910   VLDL 17 09/09/2014 0910   LDLCALC 57 11/08/2023 1621   LABVLDL 36 11/08/2023 1621      Lab Results  Component Value Date   TSH 2.440 12/10/2021   TSH 1.878 10/01/2014   FREET4  1.15 12/10/2021      Assessment & Plan:   1. Type 2 diabetes mellitus without  complication, without long-term current use of insulin (HCC)  - BOND GRIESHOP has currently uncontrolled symptomatic type 2 DM since  58 years of age.  He presents with his CGM device showing 40% time range, 37% Level One hyperglycemia, 23% level 2 hyperglycemia.      His average blood glucose is 204 for the last 14 days.  He is tolerating Jardiance 25 mg p.o. daily, metformin 250 mg p.o. twice daily.   His point-of-care A1c is 8.8%, slightly improving from 9.6% during last visit.    - Recent labs reviewed. - I had a long discussion with him about the progressive nature of diabetes and the pathology behind its complications. - He denies any gross complications of diabetes , however he  remains at a high risk for more acute and chronic complications which include CAD, CVA, CKD, retinopathy, and neuropathy. These are all discussed in detail with him.  - I have counseled him on diet  and weight management  by adopting a carbohydrate restricted/protein rich diet. Patient is encouraged to switch to  unprocessed or minimally processed     complex starch and increased protein intake (animal or plant source), fruits, and vegetables. -  he is advised to stick to a routine mealtimes to eat 3 meals  a day and avoid unnecessary snacks ( to snack only to correct hypoglycemia).   - he acknowledges that there is a room for improvement in his food and drink choices. - Suggestion is made for him to avoid simple carbohydrates  from his diet including Cakes, Sweet Desserts, Ice Cream, Soda (diet and regular), Sweet Tea, Candies, Chips, Cookies, Store Bought Juices, Alcohol , Artificial Sweeteners,  Coffee Creamer, and "Sugar-free" Products, Lemonade. This will help patient to have more stable blood glucose profile and potentially avoid unintended weight gain.  The following Lifestyle Medicine recommendations according to American  College of Lifestyle Medicine  [facility]) were discussed and and offered to patient and he  agrees to start the journey:  A. Whole Foods, Plant-Based Nutrition comprising of fruits and vegetables, plant-based proteins, whole-grain carbohydrates was discussed in detail with the patient.   A list for source of those nutrients were also provided to the patient.  Patient will use only water or unsweetened tea for hydration. B.  The need to stay away from risky substances including alcohol, smoking; obtaining 7 to 9 hours of restorative sleep, at least 150 minutes of moderate intensity exercise weekly, the importance of healthy social connections,  and stress management techniques were discussed. C.  A full color page of  Calorie density of various food groups per pound showing examples of each food groups was provided to the patient.    - he will be scheduled with Norm Salt, RDN, CDE for diabetes education.  - I have approached him with the following individualized plan to manage  his diabetes and patient agrees:   -He has not engaged in lifestyle medicine, however, responding to SGLT2 inhibitors intervention.  In light of his presentation with uncontrolled glycemic profile, he will need additional intervention.  He is approached for GLP-1 receptor agonist and he agrees.  I discussed and prescribed Mounjaro 2.5 mg subcutaneously weekly.  Side effects and precautions discussed with him.  This medication will be advanced as he tolerates. - He is advised to continue Jardiance 25 mg p.o. daily at breakfast. - He is advised to continue metformin 250 mg p.o. twice daily, did not tolerate metformin 500 mg.    He is  encouraged to utilize his freestyle libre 3 device continuously.   - he is encouraged to call clinic for blood glucose levels less than 70 or above 200 mg /dl.  - he will be considered for incretin therapy as appropriate next visit.  - Specific targets for  A1c;  LDL, HDL,  and Triglycerides  were discussed with the patient.  2) Blood Pressure /Hypertension: His blood pressure is controlled to target he is advised to continue his current medications including losartan 25 mg p.o. daily with breakfast .  3) Lipids/Hyperlipidemia: He does not have recent lipid panel to review.  He is currently on Crestor 10 mg p.o. nightly.  Side effects and precautions discussed with him.  His next labs will include fasting lipid panel.  4)  Weight/Diet:  Body mass index is 27.67 kg/m.  -     he is  a candidate for  some weight loss. I discussed with him the fact that loss of 5 - 10% of his  current body weight will have the most impact on his diabetes management.  Exercise, and detailed carbohydrates information provided  -  detailed on discharge instructions.  5) Chronic Care/Health Maintenance:  -he  is on ACEI/ARB  medications and  is encouraged to initiate and continue to follow up with Ophthalmology, Dentist,  Podiatrist at least yearly or according to recommendations, and advised to   stay away from smoking. I have recommended yearly flu vaccine and pneumonia vaccine at least every 5 years; moderate intensity exercise for up to 150 minutes weekly; and  sleep for at least 7 hours a day.  - he is  advised to maintain close follow up with Tommie Sams, DO for primary care needs, as well as his other providers for optimal and coordinated care.   I spent  26  minutes in the care of the patient today including review of labs from CMP, Lipids, Thyroid Function, Hematology (current and previous including abstractions from other facilities); face-to-face time discussing  his blood glucose readings/logs, discussing hypoglycemia and hyperglycemia episodes and symptoms, medications doses, his options of short and long term treatment based on the latest standards of care / guidelines;  discussion about incorporating lifestyle medicine;  and documenting the encounter. Risk reduction counseling performed per  USPSTF guidelines to reduce  cardiovascular risk factors.     Please refer to Patient Instructions for Blood Glucose Monitoring and Insulin/Medications Dosing Guide"  in media tab for additional information. Please  also refer to " Patient Self Inventory" in the Media  tab for reviewed elements of pertinent patient history.  Nelva Nay participated in the discussions, expressed understanding, and voiced agreement with the above plans.  All questions were answered to his satisfaction. he is encouraged to contact clinic should he have any questions or concerns prior to his return visit.     Follow up plan: - Return in about 4 months (around 06/18/2024) for F/U with Pre-visit Labs, Meter/CGM/Logs, A1c here.  Marquis Lunch, MD [facility] [address] [place], [place] [address] Phone: [phone]  Fax: [phone]    02/17/2024, 11:55 AM  This note was partially dictated with voice recognition software. Similar sounding words can be transcribed inadequately or may not  be corrected upon review.

## 2024-02-20 NOTE — Patient Instructions (Signed)
 Philip Caldwell  02/20/2024     @PREFPERIOPPHARMACY @   Your procedure is scheduled on  02/24/2024.   Report to [facility] at  0800 A.M.   Call this number if you have problems the morning of surgery:  [phone]  If you experience any cold or flu symptoms such as cough, fever, chills, shortness of breath, etc. between now and your scheduled surgery, please notify us at the above number.   Remember:        Your last dose of mounjaro should have been on 02/16/2024.        Your last dose of jardiance should have been on 02/20/2024.   Follow the diet and prep instructions given to you by the office.   You may drink clear liquids until 0600 am on 02/24/2024.    Clear liquids allowed are:                    Water, Juice (No red color; non-citric and without pulp; diabetics please choose diet or no sugar options), Carbonated beverages (diabetics please choose diet or no sugar options), Clear Tea (No creamer, milk, or cream, including half & half and powdered creamer), Black Coffee Only (No creamer, milk or cream, including half & half and powdered creamer), and Clear Sports drink (No red color; diabetics please choose diet or no sugar options)    Take these medicines the morning of surgery with A SIP OF WATER                                                        None.    Do not wear jewelry, make-up or nail polish, including gel polish,  artificial nails, or any other type of covering on natural nails (fingers and  toes).  Do not wear lotions, powders, or perfumes, or deodorant.  Do not shave 48 hours prior to surgery.  Men may shave face and neck.  Do not bring valuables to the hospital.  [facility] is not responsible for any belongings or valuables.  Contacts, dentures or bridgework may not be worn into surgery.  Leave your suitcase in the car.  After surgery it may be brought to your room.  For patients admitted to the hospital, discharge time will be determined by your  treatment team.  Patients discharged the day of surgery will not be allowed to drive home and must have someone with them for 24 hours.    Special instructions:  DO NOT smoke tobacco or vape for 24 hours before your procedure.  Please read over the following fact sheets that you were given. Anesthesia Post-op Instructions and Care and Recovery After Surgery      Colonoscopy, Adult, Care After The following information offers guidance on how to care for yourself after your procedure. Your health care provider may also give you more specific instructions. If you have problems or questions, contact your health care provider. What can I expect after the procedure? After the procedure, it is common to have: A small amount of blood in your stool for 24 hours after the procedure. Some gas. Mild cramping or bloating of your abdomen. Follow these instructions at home: Eating and drinking  Drink enough fluid to keep your urine pale yellow. Follow instructions from your health  care provider about eating or drinking restrictions. Resume your normal diet as told by your health care provider. Avoid heavy or fried foods that are hard to digest. Activity Rest as told by your health care provider. Avoid sitting for a long time without moving. Get up to take short walks every 1-2 hours. This is important to improve blood flow and breathing. Ask for help if you feel weak or unsteady. Return to your normal activities as told by your health care provider. Ask your health care provider what activities are safe for you. Managing cramping and bloating  Try walking around when you have cramps or feel bloated. If directed, apply heat to your abdomen as told by your health care provider. Use the heat source that your health care provider recommends, such as a moist heat pack or a heating pad. Place a towel between your skin and the heat source. Leave the heat on for 20-30 minutes. Remove the heat if your  skin turns bright red. This is especially important if you are unable to feel pain, heat, or cold. You have a greater risk of getting burned. General instructions If you were given a sedative during the procedure, it can affect you for several hours. Do not drive or operate machinery until your health care provider says that it is safe. For the first 24 hours after the procedure: Do not sign important documents. Do not drink alcohol. Do your regular daily activities at a slower pace than normal. Eat soft foods that are easy to digest. Take over-the-counter and prescription medicines only as told by your health care provider. Keep all follow-up visits. This is important. Contact a health care provider if: You have blood in your stool 2-3 days after the procedure. Get help right away if: You have more than a small spotting of blood in your stool. You have large blood clots in your stool. You have swelling of your abdomen. You have nausea or vomiting. You have a fever. You have increasing pain in your abdomen that is not relieved with medicine. These symptoms may be an emergency. Get help right away. Call 911. Do not wait to see if the symptoms will go away. Do not drive yourself to the hospital. Summary After the procedure, it is common to have a small amount of blood in your stool. You may also have mild cramping and bloating of your abdomen. If you were given a sedative during the procedure, it can affect you for several hours. Do not drive or operate machinery until your health care provider says that it is safe. Get help right away if you have a lot of blood in your stool, nausea or vomiting, a fever, or increased pain in your abdomen. This information is not intended to replace advice given to you by your health care provider. Make sure you discuss any questions you have with your health care provider. Document Revised: 12/28/2022 Document Reviewed: 07/08/2021 Elsevier Patient  Education  2024 Elsevier [employer].General Anesthesia, Adult, Care After The following information offers guidance on how to care for yourself after your procedure. Your health care provider may also give you more specific instructions. If you have problems or questions, contact your health care provider. What can I expect after the procedure? After the procedure, it is common for people to: Have pain or discomfort at the IV site. Have nausea or vomiting. Have a sore throat or hoarseness. Have trouble concentrating. Feel cold or chills. Feel weak, sleepy, or tired (fatigue). Have soreness  and body aches. These can affect parts of the body that were not involved in surgery. Follow these instructions at home: For the time period you were told by your health care provider:  Rest. Do not participate in activities where you could fall or become injured. Do not drive or use machinery. Do not drink alcohol. Do not take sleeping pills or medicines that cause drowsiness. Do not make important decisions or sign legal documents. Do not take care of children on your own. General instructions Drink enough fluid to keep your urine pale yellow. If you have sleep apnea, surgery and certain medicines can increase your risk for breathing problems. Follow instructions from your health care provider about wearing your sleep device: Anytime you are sleeping, including during daytime naps. While taking prescription pain medicines, sleeping medicines, or medicines that make you drowsy. Return to your normal activities as told by your health care provider. Ask your health care provider what activities are safe for you. Take over-the-counter and prescription medicines only as told by your health care provider. Do not use any products that contain nicotine or tobacco. These products include cigarettes, chewing tobacco, and vaping devices, such as e-cigarettes. These can delay incision healing after surgery. If you need  help quitting, ask your health care provider. Contact a health care provider if: You have nausea or vomiting that does not get better with medicine. You vomit every time you eat or drink. You have pain that does not get better with medicine. You cannot urinate or have bloody urine. You develop a skin rash. You have a fever. Get help right away if: You have trouble breathing. You have chest pain. You vomit blood. These symptoms may be an emergency. Get help right away. Call 911. Do not wait to see if the symptoms will go away. Do not drive yourself to the hospital. Summary After the procedure, it is common to have a sore throat, hoarseness, nausea, vomiting, or to feel weak, sleepy, or fatigue. For the time period you were told by your health care provider, do not drive or use machinery. Get help right away if you have difficulty breathing, have chest pain, or vomit blood. These symptoms may be an emergency. This information is not intended to replace advice given to you by your health care provider. Make sure you discuss any questions you have with your health care provider. Document Revised: 02/12/2022 Document Reviewed: 02/12/2022 Elsevier Patient Education  2024 [employer].

## 2024-02-21 ENCOUNTER — Ambulatory Visit: Payer: 59 | Admitting: Family Medicine

## 2024-02-22 ENCOUNTER — Encounter (HOSPITAL_COMMUNITY): Payer: Self-pay

## 2024-02-22 ENCOUNTER — Encounter (HOSPITAL_COMMUNITY)
Admission: RE | Admit: 2024-02-22 | Discharge: 2024-02-22 | Disposition: A | Discharge status: Home / Self Care | Payer: 59 | Source: Ambulatory Visit | Attending: Internal Medicine | Admitting: Internal Medicine

## 2024-02-22 VITALS — BP 110/83 | HR 77 | Temp 97.5°F | Resp 18

## 2024-02-22 DIAGNOSIS — I129 Hypertensive chronic kidney disease with stage 1 through stage 4 chronic kidney disease, or unspecified chronic kidney disease: Secondary | ICD-10-CM | POA: Diagnosis not present

## 2024-02-22 DIAGNOSIS — Z01818 Encounter for other preprocedural examination: Secondary | ICD-10-CM | POA: Insufficient documentation

## 2024-02-22 DIAGNOSIS — N1831 Chronic kidney disease, stage 3a: Secondary | ICD-10-CM | POA: Diagnosis not present

## 2024-02-22 DIAGNOSIS — E1122 Type 2 diabetes mellitus with diabetic chronic kidney disease: Secondary | ICD-10-CM | POA: Insufficient documentation

## 2024-02-22 DIAGNOSIS — Z794 Long term (current) use of insulin: Secondary | ICD-10-CM | POA: Diagnosis not present

## 2024-02-22 DIAGNOSIS — I10 Essential (primary) hypertension: Secondary | ICD-10-CM

## 2024-02-22 LAB — BASIC METABOLIC PANEL
Anion gap: 10 (ref 5–15)
BUN: 18 mg/dL (ref 6–20)
CO2: 22 mmol/L (ref 22–32)
Calcium: 9.2 mg/dL (ref 8.9–10.3)
Chloride: 105 mmol/L (ref 98–111)
Creatinine, Ser: 1.22 mg/dL (ref 0.61–1.24)
GFR, Estimated: >60 mL/min (ref >60)
Glucose, Bld: 127 mg/dL — ABNORMAL HIGH (ref 70–99)
Potassium: 3.7 mmol/L (ref 3.5–5.1)
Sodium: 137 mmol/L (ref 135–145)

## 2024-02-24 ENCOUNTER — Ambulatory Visit (HOSPITAL_COMMUNITY)
Admission: RE | Admit: 2024-02-24 | Discharge: 2024-02-24 | Disposition: A | Discharge status: Home / Self Care | Payer: 59 | Source: Ambulatory Visit | Attending: Internal Medicine | Admitting: Internal Medicine

## 2024-02-24 ENCOUNTER — Ambulatory Visit (HOSPITAL_BASED_OUTPATIENT_CLINIC_OR_DEPARTMENT_OTHER): Admitting: Certified Registered Nurse Anesthetist

## 2024-02-24 ENCOUNTER — Encounter (HOSPITAL_COMMUNITY): Payer: Self-pay | Admitting: Internal Medicine

## 2024-02-24 ENCOUNTER — Encounter (HOSPITAL_COMMUNITY)
Admission: RE | Disposition: A | Discharge status: Home / Self Care | Payer: Self-pay | Source: Ambulatory Visit | Attending: Internal Medicine

## 2024-02-24 ENCOUNTER — Ambulatory Visit (HOSPITAL_COMMUNITY): Admitting: Certified Registered Nurse Anesthetist

## 2024-02-24 DIAGNOSIS — Z860100 Personal history of colon polyps, unspecified: Secondary | ICD-10-CM | POA: Diagnosis not present

## 2024-02-24 DIAGNOSIS — I10 Essential (primary) hypertension: Secondary | ICD-10-CM | POA: Diagnosis not present

## 2024-02-24 DIAGNOSIS — I4891 Unspecified atrial fibrillation: Secondary | ICD-10-CM | POA: Diagnosis not present

## 2024-02-24 DIAGNOSIS — Z1211 Encounter for screening for malignant neoplasm of colon: Secondary | ICD-10-CM | POA: Diagnosis present

## 2024-02-24 DIAGNOSIS — Z8249 Family history of ischemic heart disease and other diseases of the circulatory system: Secondary | ICD-10-CM | POA: Insufficient documentation

## 2024-02-24 DIAGNOSIS — K6389 Other specified diseases of intestine: Secondary | ICD-10-CM | POA: Diagnosis not present

## 2024-02-24 DIAGNOSIS — Z860101 Personal history of adenomatous and serrated colon polyps: Secondary | ICD-10-CM | POA: Diagnosis not present

## 2024-02-24 DIAGNOSIS — D123 Benign neoplasm of transverse colon: Secondary | ICD-10-CM

## 2024-02-24 DIAGNOSIS — K5730 Diverticulosis of large intestine without perforation or abscess without bleeding: Secondary | ICD-10-CM | POA: Insufficient documentation

## 2024-02-24 HISTORY — PX: COLONOSCOPY WITH PROPOFOL: SHX5780

## 2024-02-24 HISTORY — PX: POLYPECTOMY: SHX5525

## 2024-02-24 LAB — GLUCOSE, CAPILLARY: Glucose-Capillary: 139 mg/dL — ABNORMAL HIGH (ref 70–99)

## 2024-02-24 SURGERY — COLONOSCOPY WITH PROPOFOL
Anesthesia: General

## 2024-02-24 MED ORDER — PHENYLEPHRINE 80 MCG/ML (10ML) SYRINGE FOR IV PUSH (FOR BLOOD PRESSURE SUPPORT)
PREFILLED_SYRINGE | INTRAVENOUS | Status: DC | PRN
Start: 2024-02-24 — End: 2024-02-24
  Administered 2024-02-24: 80 ug via INTRAVENOUS

## 2024-02-24 MED ORDER — PROPOFOL 500 MG/50ML IV EMUL
INTRAVENOUS | Status: DC | PRN
  Administered 2024-02-24: 150 ug/kg/min via INTRAVENOUS
  Administered 2024-02-24: 60 mg via INTRAVENOUS

## 2024-02-24 MED ORDER — PROPOFOL 500 MG/50ML IV EMUL
INTRAVENOUS | Status: AC
  Filled 2024-02-24: qty 50

## 2024-02-24 MED ORDER — STERILE WATER FOR IRRIGATION IR SOLN
Status: DC | PRN
  Administered 2024-02-24: 100 mL

## 2024-02-24 MED ORDER — LACTATED RINGERS IV SOLN
INTRAVENOUS | Status: DC
Start: 2024-02-24 — End: 2024-02-24

## 2024-02-24 MED ORDER — PHENYLEPHRINE 80 MCG/ML (10ML) SYRINGE FOR IV PUSH (FOR BLOOD PRESSURE SUPPORT)
PREFILLED_SYRINGE | INTRAVENOUS | Status: AC
  Filled 2024-02-24: qty 10

## 2024-02-24 NOTE — Anesthesia Postprocedure Evaluation (Signed)
 Anesthesia Post Note  Patient: Philip Caldwell  Procedure(s) Performed: COLONOSCOPY WITH PROPOFOL POLYPECTOMY  Patient location during evaluation: Phase II Anesthesia Type: General Level of consciousness: awake Pain management: pain level controlled Vital Signs Assessment: post-procedure vital signs reviewed and stable Respiratory status: spontaneous breathing and respiratory function stable Cardiovascular status: blood pressure returned to baseline and stable Postop Assessment: no headache and no apparent nausea or vomiting Anesthetic complications: no Comments: Late entry   No notable events documented.   Last Vitals:  Vitals:   02/24/24 0749 02/24/24 0834  BP: (!) 139/91 (!) 103/59  Pulse:  77  Resp: 12 12  Temp: 36.6 C 36.5 C  SpO2: 99% 96%    Last Pain:  Vitals:   02/24/24 0834  TempSrc: Oral  PainSc: 0-No pain                 Windell Norfolk

## 2024-02-24 NOTE — Op Note (Addendum)
 [facility] Patient Name: Philip Caldwell Procedure Date: 02/24/2024 8:04 AM MRN: [id number] Date of Birth: [birth year] Attending MD: Hennie Duos. Marletta Lor , [place], [id number] CSN: [id number] Age: 58 Admit Type: Outpatient Procedure:                Colonoscopy Indications:              Surveillance: Personal history of adenomatous                            polyps on last colonoscopy > 5 years ago Providers:                Hennie Duos. Marletta Lor, DO, Buel Ream. Thomasena Edis RN, RN,                            Pandora Leiter, Technician Referring MD:              Medicines:                See the Anesthesia note for documentation of the                            administered medications Complications:            No immediate complications. Estimated Blood Loss:     Estimated blood loss was minimal. Procedure:                Pre-Anesthesia Assessment:                           - The anesthesia plan was to use monitored                            anesthesia care (MAC).                           After obtaining informed consent, the colonoscope                            was passed under direct vision. Throughout the                            procedure, the patient's blood pressure, pulse, and                            oxygen saturations were monitored continuously. The                            [id number]-HQ190L ([id number]) scope was introduced through                            the anus and advanced to the the cecum, identified                            by appendiceal orifice and ileocecal valve. The                            colonoscopy  was performed without difficulty. The                            patient tolerated the procedure well. The quality                            of the bowel preparation was evaluated using the                            BBPS [facility] Bowel Preparation Scale) with scores                            of: Right Colon = 3, Transverse Colon = 3 and Left                             Colon = 3 (entire mucosa seen well with no residual                            staining, small fragments of stool or opaque                            liquid). The total BBPS score equals 9. Scope In: 8:16:59 AM Scope Out: 8:29:12 AM Scope Withdrawal Time: 0 hours 9 minutes 23 seconds  Total Procedure Duration: 0 hours 12 minutes 13 seconds  Findings:      A 5 mm polyp was found in the proximal transverse colon. The polyp was       sessile. The polyp was removed with a cold snare. Resection and       retrieval were complete.      Multiple medium-mouthed and small-mouthed diverticula were found in the       sigmoid colon.      The terminal ileum appeared normal. Impression:               - One 5 mm polyp in the proximal transverse colon,                            removed with a cold snare. Resected and retrieved.                           - Diverticulosis in the sigmoid colon.                           - The examined portion of the ileum was normal. Moderate Sedation:      Per Anesthesia Care Recommendation:           - Patient has a contact number available for                            emergencies. The signs and symptoms of potential                            delayed complications were discussed with the  patient. Return to normal activities tomorrow.                            Written discharge instructions were provided to the                            patient.                           - Resume previous diet.                           - Continue present medications.                           - Await pathology results.                           - Repeat colonoscopy in 7-10 years for surveillance.                           - Return to GI clinic PRN. Procedure Code(s):        --- Professional ---                           [phone], Colonoscopy, flexible; with removal of                            tumor(s), polyp(s), or other lesion(s) by snare                             technique Diagnosis Code(s):        --- Professional ---                           Z86.010, Personal history of colonic polyps                           D12.3, Benign neoplasm of transverse colon (hepatic                            flexure or splenic flexure)                           K57.30, Diverticulosis of large intestine without                            perforation or abscess without bleeding CPT copyright 2022 American Medical Association. All rights reserved. The codes documented in this report are preliminary and upon coder review may  be revised to meet current compliance requirements. Hennie Duos. Marletta Lor, DO Hennie Duos. Marletta Lor, DO 02/24/2024 9:26:58 AM This report has been signed electronically. Number of Addenda: 0

## 2024-02-24 NOTE — Discharge Instructions (Addendum)
  Colonoscopy Discharge Instructions  Read the instructions outlined below and refer to this sheet in the next few weeks. These discharge instructions provide you with general information on caring for yourself after you leave the hospital. Your doctor may also give you specific instructions. While your treatment has been planned according to the most current medical practices available, unavoidable complications occasionally occur.   ACTIVITY You may resume your regular activity, but move at a slower pace for the next 24 hours.  Take frequent rest periods for the next 24 hours.  Walking will help get rid of the air and reduce the bloated feeling in your belly (abdomen).  No driving for 24 hours (because of the medicine (anesthesia) used during the test).   Do not sign any important legal documents or operate any machinery for 24 hours (because of the anesthesia used during the test).  NUTRITION Drink plenty of fluids.  You may resume your normal diet as instructed by your doctor.  Begin with a light meal and progress to your normal diet. Heavy or fried foods are harder to digest and may make you feel sick to your stomach (nauseated).  Avoid alcoholic beverages for 24 hours or as instructed.  MEDICATIONS You may resume your normal medications unless your doctor tells you otherwise.  WHAT YOU CAN EXPECT TODAY Some feelings of bloating in the abdomen.  Passage of more gas than usual.  Spotting of blood in your stool or on the toilet paper.  IF YOU HAD POLYPS REMOVED DURING THE COLONOSCOPY: No aspirin products for 7 days or as instructed.  No alcohol for 7 days or as instructed.  Eat a soft diet for the next 24 hours.  FINDING OUT THE RESULTS OF YOUR TEST Not all test results are available during your visit. If your test results are not back during the visit, make an appointment with your caregiver to find out the results. Do not assume everything is normal if you have not heard from your  caregiver or the medical facility. It is important for you to follow up on all of your test results.  SEEK IMMEDIATE MEDICAL ATTENTION IF: You have more than a spotting of blood in your stool.  Your belly is swollen (abdominal distention).  You are nauseated or vomiting.  You have a temperature over 101.  You have abdominal pain or discomfort that is severe or gets worse throughout the day.   Your colonoscopy revealed 1 polyp(s) which I removed successfully. Await pathology results, my office will contact you. I recommend repeating colonoscopy in 7-10 years for surveillance purposes depending on pathology results.   You also have diverticulosis. I would recommend increasing fiber in your diet or adding OTC Benefiber/Metamucil. Be sure to drink at least 4 to 6 glasses of water daily. Follow-up with GI as needed.   I hope you have a great rest of your week!  Hennie Duos. Marletta Lor, D.O. Gastroenterology and Hepatology [facility]

## 2024-02-24 NOTE — H&P (Signed)
 Primary Care Physician:  Tommie Sams, DO Primary Gastroenterologist:  Dr. Marletta Lor  Pre-Procedure History & Physical: HPI:  Philip Caldwell is a 58 y.o. male is here for a colonoscopy to be performed for surveillance purposes, personal history of adenomatous colon polyps in 2016  Past Medical History:  Diagnosis Date   A-fib [facility])    Arm pain, central    with intermittent numbness   Diabetes mellitus without complication (HCC)    Hypertension    Neck pain    Neuropathic pain 05/09/2023   Renal disorder    Tendonitis     Past Surgical History:  Procedure Laterality Date   COLONOSCOPY N/A 10/08/2015   Procedure: COLONOSCOPY;  Surgeon: Malissa Hippo, MD;  Location: [facility]ITE;  Service: Endoscopy;  Laterality: N/A;  1030   CYSTOSCOPY WITH STENT PLACEMENT Left 05/25/2020   Procedure: CYSTOSCOPY WITH STENT PLACEMENT retrograde pylegram;  Surgeon: Crista Elliot, MD;  Location: [facility] ORS;  Service: Urology;  Laterality: Left;   NERVE SURGERY      Prior to Admission medications   Medication Sig Start Date End Date Taking? Authorizing Provider  hydrocortisone 2.5 % ointment Apply 1 application topically 2 (two) times daily as needed (rash). 10/08/21  Yes Babs Sciara, MD  metFORMIN (GLUCOPHAGE) 500 MG tablet Take 1 tablet (500 mg total) by mouth 2 (two) times daily with a meal. Patient taking differently: Take 250 mg by mouth 2 (two) times daily with a meal. 11/15/23  Yes Nida, Denman George, MD  Na Sulfate-K Sulfate-Mg Sulfate concentrate 17.5-3.13-1.6 GM/177ML SOLN As directed 01/06/24  Yes Lanelle Bal, DO  Accu-Chek Softclix Lancets lancets Check glucose 3x per day. 11/18/20   Laroy Apple M, DO  Continuous Glucose Receiver (DEXCOM G7 RECEIVER) DEVI Use to monitor glucose continuously as directed. 01/30/24   Roma Kayser, MD  Continuous Glucose Sensor (DEXCOM G7 SENSOR) MISC Inject 1 Application into the skin as directed. Change sensor every 10 days as directed.  11/16/23   Roma Kayser, MD  Continuous Glucose Sensor (DEXCOM G7 SENSOR) MISC Use to monitor glucose continuously as directed. Change sensor every 10 days. 01/30/24   Roma Kayser, MD  empagliflozin (JARDIANCE) 25 MG TABS tablet Take 1 tablet (25 mg total) by mouth daily before breakfast. 01/10/24   Nida, Denman George, MD  Insulin Pen Needle 29G X MISC Use to inject insulin daily 02/17/24   Roma Kayser, MD  losartan (COZAAR) 25 MG tablet TAKE 2 TABLETS BY MOUTH EVERY DAY 02/08/24   Everlene Other G, DO  rosuvastatin (CRESTOR) 10 MG tablet Take 1 tablet (10 mg total) by mouth daily. 11/21/23   Tommie Sams, DO  tirzepatide [facility]) 2.5 MG/0.5ML Pen Inject 2.5 mg into the skin once a week. 02/17/24   Roma Kayser, MD    Allergies as of 01/06/2024 - Review Complete 12/12/2023  Allergen Reaction Noted   Lisinopril Cough 05/15/2018   Penicillins Rash     Family History  Problem Relation Age of Onset   Thyroid disease Mother    Dementia Mother    Hypertension Father    Cancer Father    Heart attack Brother     Social History   Socioeconomic History   Marital status: Married    Spouse name: Not on file   Number of children: Not on file   Years of education: Not on file   Highest education level: Not on file  Occupational History  Not on file  Tobacco Use   Smoking status: Never   Smokeless tobacco: Never  Vaping Use   Vaping status: Never Used  Substance and Sexual Activity   Alcohol use: No   Drug use: No   Sexual activity: Yes    Birth control/protection: None  Other Topics Concern   Not on file  Social History Narrative   Not on file   Social Drivers of Health   Financial Resource Strain: Not on file  Food Insecurity: Not on file  Transportation Needs: Not on file  Physical Activity: Not on file  Stress: Not on file  Social Connections: Unknown (04/13/2022)   Received from [facility]   Social Network     Social Network: Not on file  Intimate Partner Violence: Unknown (03/05/2022)   Received from [facility], [facility]   HITS    Physically Hurt: Not on file    Insult or Talk Down To: Not on file    Threaten Physical Harm: Not on file    Scream or Curse: Not on file    Review of Systems: See HPI, otherwise negative ROS  Physical Exam: Vital signs in last 24 hours: Temp:  [97.9 F (36.6 C)] 97.9 F (36.6 C) (03/28 0749) Resp:  [12] 12 (03/28 0749) BP: (139)/(91) 139/91 (03/28 0749) SpO2:  [99 %] 99 % (03/28 0749) Weight:  [85 kg] 85 kg (03/28 0748)   General:   Alert,  Well-developed, well-nourished, pleasant and cooperative in NAD Head:  Normocephalic and atraumatic. Eyes:  Sclera clear, no icterus.   Conjunctiva pink. Ears:  Normal auditory acuity. Nose:  No deformity, discharge,  or lesions. Msk:  Symmetrical without gross deformities. Normal posture. Extremities:  Without clubbing or edema. Neurologic:  Alert and  oriented x4;  grossly normal neurologically. Skin:  Intact without significant lesions or rashes. Psych:  Alert and cooperative. Normal mood and affect.  Impression/Plan: Philip Caldwell is here for a colonoscopy to be performed for surveillance purposes, personal history of adenomatous colon polyps in 2016   The risks of the procedure including infection, bleed, or perforation as well as benefits, limitations, alternatives and imponderables have been reviewed with the patient. Questions have been answered. All parties agreeable.

## 2024-02-24 NOTE — Anesthesia Preprocedure Evaluation (Signed)
 Anesthesia Evaluation  Patient identified by MRN, date of birth, ID band Patient awake    Reviewed: Allergy & Precautions, H&P , NPO status , Patient's Chart, lab work & pertinent test results, reviewed documented beta blocker date and time   Airway Mallampati: II  TM Distance: >3 FB Neck ROM: full    Dental no notable dental hx.    Pulmonary neg pulmonary ROS   Pulmonary exam normal breath sounds clear to auscultation       Cardiovascular Exercise Tolerance: Good hypertension, + dysrhythmias Atrial Fibrillation  Rhythm:regular Rate:Normal     Neuro/Psych negative neurological ROS  negative psych ROS   GI/Hepatic negative GI ROS, Neg liver ROS,,,  Endo/Other  negative endocrine ROSdiabetes    Renal/GU Renal diseasenegative Renal ROS  negative genitourinary   Musculoskeletal   Abdominal   Peds  Hematology negative hematology ROS (+)   Anesthesia Other Findings   Reproductive/Obstetrics negative OB ROS                             Anesthesia Physical Anesthesia Plan  ASA: 3  Anesthesia Plan: General   Post-op Pain Management:    Induction:   PONV Risk Score and Plan: Propofol infusion  Airway Management Planned:   Additional Equipment:   Intra-op Plan:   Post-operative Plan:   Informed Consent: I have reviewed the patients History and Physical, chart, labs and discussed the procedure including the risks, benefits and alternatives for the proposed anesthesia with the patient or authorized representative who has indicated his/her understanding and acceptance.     Dental Advisory Given  Plan Discussed with: CRNA  Anesthesia Plan Comments:        Anesthesia Quick Evaluation

## 2024-02-24 NOTE — Transfer of Care (Signed)
 Immediate Anesthesia Transfer of Care Note  Patient: Philip Caldwell  Procedure(s) Performed: COLONOSCOPY WITH PROPOFOL POLYPECTOMY  Patient Location: Short Stay  Anesthesia Type:General  Level of Consciousness: awake, alert , and oriented  Airway & Oxygen Therapy: Patient Spontanous Breathing  Post-op Assessment: Report given to RN, Post -op Vital signs reviewed and stable, Patient moving all extremities X 4, and Patient able to stick tongue midline  Post vital signs: Reviewed and stable  Last Vitals:  Vitals Value Taken Time  BP 103/59 02/24/24 0834  Temp 36.5 C 02/24/24 0834  Pulse 77 02/24/24 0834  Resp 12 02/24/24 0834  SpO2 96 % 02/24/24 0834    Last Pain:  Vitals:   02/24/24 0834  TempSrc: Oral  PainSc: 0-No pain         Complications: No notable events documented.

## 2024-02-27 ENCOUNTER — Encounter (HOSPITAL_COMMUNITY): Payer: Self-pay | Admitting: Internal Medicine

## 2024-02-27 LAB — SURGICAL PATHOLOGY

## 2024-03-28 ENCOUNTER — Telehealth: Payer: Self-pay

## 2024-03-28 NOTE — Telephone Encounter (Signed)
 Pt called stating this morning at approximately 2:30am he experienced a hypoglycemic episode. Stated his glucose dropped to 65. Pt is taking Mounjaro2.5mg  SQ weekly, Jardiance  25mg  daily and Metformin  250mg  bid Pt stated he has had decreased appetite since taking Mounjaro but did eat dinner yesterday evening. States he did not take his evening dose of Metformin  last night, he also held his morning dose of Metformin  and Jardiance  this morning. Pt's Dexcom CGM data uploaded and evaluated by Whitney Reardon,FNP. Discussed pt's CGM data and medication with Whitney. Advised pt to be sure if his Dexcom alerts him to low glucose readings again to stick his finger to compare results also to cut his Jardiance  in half to 12.5mg  daily, continue Metformin  250mg  bid and Mounjaro 2.5mg  weekly per Hulon Magic, FNP.  Pt voiced understanding.

## 2024-05-24 LAB — COMPREHENSIVE METABOLIC PANEL WITH GFR
ALT: 11 IU/L (ref 0–44)
AST: 13 IU/L (ref 0–40)
Albumin: 4.1 g/dL (ref 3.8–4.9)
Alkaline Phosphatase: 82 IU/L (ref 44–121)
BUN/Creatinine Ratio: 15 (ref 9–20)
BUN: 21 mg/dL (ref 6–24)
Bilirubin Total: 0.2 mg/dL (ref 0.0–1.2)
CO2: 19 mmol/L — ABNORMAL LOW (ref 20–29)
Calcium: 9.4 mg/dL (ref 8.7–10.2)
Chloride: 104 mmol/L (ref 96–106)
Creatinine, Ser: 1.39 mg/dL — ABNORMAL HIGH (ref 0.76–1.27)
Globulin, Total: 2.7 g/dL (ref 1.5–4.5)
Glucose: 110 mg/dL — ABNORMAL HIGH (ref 70–99)
Potassium: 4.6 mmol/L (ref 3.5–5.2)
Sodium: 139 mmol/L (ref 134–144)
Total Protein: 6.8 g/dL (ref 6.0–8.5)
eGFR: 58 mL/min/{1.73_m2} — ABNORMAL LOW (ref >59)

## 2024-05-24 LAB — PHOSPHORUS: Phosphorus: 3.1 mg/dL (ref 2.8–4.1)

## 2024-05-24 LAB — MAGNESIUM: Magnesium: 2.1 mg/dL (ref 1.6–2.3)

## 2024-05-24 LAB — LIPID PANEL
Chol/HDL Ratio: 5.7 ratio — ABNORMAL HIGH (ref 0.0–5.0)
Cholesterol, Total: 244 mg/dL — ABNORMAL HIGH (ref 100–199)
HDL: 43 mg/dL (ref >39)
LDL Chol Calc (NIH): 150 mg/dL — ABNORMAL HIGH (ref 0–99)
Triglycerides: 275 mg/dL — ABNORMAL HIGH (ref 0–149)
VLDL Cholesterol Cal: 51 mg/dL — ABNORMAL HIGH (ref 5–40)

## 2024-05-24 LAB — PTH, INTACT AND CALCIUM: PTH: 50 pg/mL (ref 15–65)

## 2024-05-24 LAB — TSH: TSH: 2.42 u[IU]/mL (ref 0.450–4.500)

## 2024-05-24 LAB — VITAMIN D 25 HYDROXY (VIT D DEFICIENCY, FRACTURES): Vit D, 25-Hydroxy: 30 ng/mL (ref 30.0–100.0)

## 2024-05-24 LAB — T4, FREE: Free T4: 1.07 ng/dL (ref 0.82–1.77)

## 2024-06-02 ENCOUNTER — Other Ambulatory Visit: Payer: Self-pay | Admitting: "Endocrinology

## 2024-06-07 ENCOUNTER — Other Ambulatory Visit: Payer: Self-pay | Admitting: Family Medicine

## 2024-06-26 ENCOUNTER — Encounter: Payer: Self-pay | Admitting: "Endocrinology

## 2024-06-26 ENCOUNTER — Ambulatory Visit (INDEPENDENT_AMBULATORY_CARE_PROVIDER_SITE_OTHER): Admitting: "Endocrinology

## 2024-06-26 VITALS — BP 110/82 | HR 88 | Ht 69.0 in | Wt 185.4 lb

## 2024-06-26 DIAGNOSIS — E119 Type 2 diabetes mellitus without complications: Secondary | ICD-10-CM | POA: Diagnosis not present

## 2024-06-26 DIAGNOSIS — I10 Essential (primary) hypertension: Secondary | ICD-10-CM

## 2024-06-26 DIAGNOSIS — E1122 Type 2 diabetes mellitus with diabetic chronic kidney disease: Secondary | ICD-10-CM

## 2024-06-26 DIAGNOSIS — E782 Mixed hyperlipidemia: Secondary | ICD-10-CM | POA: Diagnosis not present

## 2024-06-26 DIAGNOSIS — Z794 Long term (current) use of insulin: Secondary | ICD-10-CM

## 2024-06-26 DIAGNOSIS — N1831 Chronic kidney disease, stage 3a: Secondary | ICD-10-CM

## 2024-06-26 LAB — POCT GLYCOSYLATED HEMOGLOBIN (HGB A1C): HbA1c, POC (controlled diabetic range): 7.2 % — AB (ref 0.0–7.0)

## 2024-06-26 MED ORDER — TIRZEPATIDE 5 MG/0.5ML ~~LOC~~ SOAJ
5.0000 mg | SUBCUTANEOUS | 0 refills | Status: AC
Start: 2024-06-26 — End: ?

## 2024-06-26 MED ORDER — LOSARTAN POTASSIUM 25 MG PO TABS: 25.0000 mg | ORAL_TABLET | Freq: Every day | ORAL | 1 refills | Status: DC

## 2024-06-26 NOTE — Patient Instructions (Signed)

## 2024-06-26 NOTE — Progress Notes (Signed)
 06/26/2024, 2:19 PM  Endocrinology follow-up note   Subjective:    Patient ID: Philip Caldwell, male    DOB: 1966/07/24.  Philip Caldwell is being seen in follow-up after he was seen in consultation for management of currently uncontrolled symptomatic diabetes requested by  Cook, [name], DO.   Past Medical History:  Diagnosis Date   A-fib [facility])    Arm pain, central    with intermittent numbness   Diabetes mellitus without complication (HCC)    Hypertension    Neck pain    Neuropathic pain 05/09/2023   Renal disorder    Tendonitis     Past Surgical History:  Procedure Laterality Date   COLONOSCOPY N/A 10/08/2015   Procedure: COLONOSCOPY;  Surgeon: Claudis RAYMOND Rivet, MD;  Location: [facility];  Service: Endoscopy;  Laterality: N/A;  1030   COLONOSCOPY WITH PROPOFOL  N/A 02/24/2024   Procedure: COLONOSCOPY WITH PROPOFOL ;  Surgeon: Cindie Carlin POUR, DO;  Location: [facility];  Service: Endoscopy;  Laterality: N/A;  10:00 am, asa 3   CYSTOSCOPY WITH STENT PLACEMENT Left 05/25/2020   Procedure: CYSTOSCOPY WITH STENT PLACEMENT retrograde pylegram;  Surgeon: Carolee Sherwood JONETTA DOUGLAS, MD;  Location: [facility];  Service: Urology;  Laterality: Left;   NERVE SURGERY     POLYPECTOMY  02/24/2024   Procedure: POLYPECTOMY;  Surgeon: Cindie Carlin POUR, DO;  Location: [facility];  Service: Endoscopy;;    Social History   Socioeconomic History   Marital status: Married    Spouse name: Not on file   Number of children: Not on file   Years of education: Not on file   Highest education level: Not on file  Occupational History   Not on file  Tobacco Use   Smoking status: Never   Smokeless tobacco: Never  Vaping Use   Vaping status: Never Used  Substance and Sexual Activity   Alcohol use: No   Drug use: No   Sexual activity: Yes    Birth control/protection: None  Other Topics Concern   Not on file  Social History  Narrative   Not on file   Social Drivers of Health   Financial Resource Strain: Not on file  Food Insecurity: Not on file  Transportation Needs: Not on file  Physical Activity: Not on file  Stress: Not on file  Social Connections: Unknown (04/13/2022)   Received from [facility]   Social Network    Social Network: Not on file    Family History  Problem Relation Age of Onset   Thyroid disease Mother    Dementia Mother    Hypertension Father    Cancer Father    Heart attack Brother     Outpatient Encounter Medications as of 06/26/2024  Medication Sig   metFORMIN  (GLUCOPHAGE ) 500 MG tablet Take 1 tablet (500 mg total) by mouth 2 (two) times daily with a meal.   tirzepatide  (MOUNJARO ) 5 MG/0.5ML Pen Inject 5 mg into the skin once a week.   Accu-Chek Softclix Lancets lancets Check glucose 3x per day.   Continuous Glucose Receiver (DEXCOM G7 RECEIVER) DEVI Use to monitor glucose continuously as directed.   Continuous  Glucose Sensor (DEXCOM G7 SENSOR) MISC Inject 1 Application into the skin as directed. Change sensor every 10 days as directed.   Continuous Glucose Sensor (DEXCOM G7 SENSOR) MISC Use to monitor glucose continuously as directed. Change sensor every 10 days.   Continuous Glucose Sensor (DEXCOM G7 SENSOR) MISC USE TO MONITOR GLUCOSE CONTINUOUSLY AS DIRECTED. CHANGE SENSOR EVERY 10 DAYS.   empagliflozin  (JARDIANCE ) 25 MG TABS tablet Take 1 tablet (25 mg total) by mouth daily before breakfast.   hydrocortisone  2.5 % ointment Apply 1 application topically 2 (two) times daily as needed (rash).   Insulin  Pen Needle 29G X MISC Use to inject insulin  daily   losartan  (COZAAR ) 25 MG tablet Take 1 tablet (25 mg total) by mouth daily.   rosuvastatin  (CRESTOR ) 10 MG tablet Take 1 tablet (10 mg total) by mouth daily.   [DISCONTINUED] losartan  (COZAAR ) 25 MG tablet TAKE 2 TABLETS BY MOUTH EVERY DAY   [DISCONTINUED] metFORMIN  (GLUCOPHAGE ) 1000 MG tablet TAKE 1 TABLET BY MOUTH TWICE  A DAY   [DISCONTINUED] tirzepatide  (MOUNJARO ) 2.5 MG/0.5ML Pen Inject 2.5 mg into the skin once a week.   No facility-administered encounter medications on file as of 06/26/2024.    ALLERGIES: Allergies  Allergen Reactions   Lisinopril  Cough   Penicillins Rash    Has patient had a PCN reaction causing immediate rash, facial/tongue/throat swelling, SOB or lightheadedness with hypotension: Yes Has patient had a PCN reaction causing severe rash involving mucus membranes or skin necrosis: No Has patient had a PCN reaction that required hospitalization No Has patient had a PCN reaction occurring within the last 10 years: No If all of the above answers are NO, then may proceed with Cephalosporin use.      VACCINATION STATUS: Immunization History  Administered Date(s) Administered   Influenza,inj,Quad PF,6+ Mos 10/03/2017, 11/06/2018, 09/07/2019   Moderna Sars-Covid-2 Vaccination 01/27/2020, 02/21/2020, 10/31/2020   Pneumococcal Polysaccharide-23 11/06/2018    Diabetes He presents for his follow-up diabetic visit. He has type 2 diabetes mellitus. Onset time: he was diagnosed at approx age of 58 years. His disease course has been improving (Patient missed his appointment since January 2023, returns with worsening glycemic profile.). There are no hypoglycemic associated symptoms. Pertinent negatives for hypoglycemia include no confusion, headaches, pallor or seizures. Associated symptoms include polydipsia and polyuria. Pertinent negatives for diabetes include no chest pain, no fatigue, no polyphagia and no weakness. There are no hypoglycemic complications. Symptoms are improving. Diabetic complications include nephropathy. Risk factors for coronary artery disease include dyslipidemia, diabetes mellitus, hypertension and male sex. His weight is fluctuating minimally. He is following a generally unhealthy diet. He has not had a previous visit with a dietitian. He participates in exercise  intermittently. His home blood glucose trend is decreasing steadily. His breakfast blood glucose range is generally 140-180 mg/dl. His lunch blood glucose range is generally 140-180 mg/dl. His dinner blood glucose range is generally 140-180 mg/dl. His bedtime blood glucose range is generally 140-180 mg/dl. His overall blood glucose range is 140-180 mg/dl. (He presents with his CGM device showing improved profile showing 73% time range, 25% in 1 hyperglycemia, 2% level 2 hyperglycemia.  He has no hypoglycemia.  His average blood glucose is 165 mg per DL for the most recent 2 weeks.  His point-of-care A1c is 7.2% improving progressively from 9.6%.       ) An ACE inhibitor/angiotensin II receptor blocker is being taken. Eye exam is current.  Hyperlipidemia This is a chronic problem. The current episode  started more than 1 year ago. The problem is uncontrolled. Exacerbating diseases include diabetes. Pertinent negatives include no chest pain, myalgias or shortness of breath. He is currently on no antihyperlipidemic treatment. Risk factors for coronary artery disease include dyslipidemia, diabetes mellitus, hypertension and male sex.  Hypertension This is a chronic problem. The current episode started more than 1 year ago. The problem is controlled. Pertinent negatives include no chest pain, headaches, neck pain, palpitations or shortness of breath. Risk factors for coronary artery disease include dyslipidemia, diabetes mellitus and male gender. Past treatments include angiotensin blockers.     Objective:       06/26/2024    1:02 PM 02/24/2024    8:34 AM 02/24/2024    7:49 AM  Vitals with BMI  Height 5' 9    Weight 185 lbs 6 oz    BMI 27.37    Systolic 110 103 860  Diastolic 82 59 91  Pulse 88 77     BP 110/82   Pulse 88   Ht 5' 9 (1.753 m)   Wt 185 lb 6.4 oz (84.1 kg)   BMI 27.38 kg/m   Wt Readings from Last 3 Encounters:  06/26/24 185 lb 6.4 oz (84.1 kg)  02/24/24 187 lb 6.3 oz (85  kg)  02/17/24 187 lb 6.4 oz (85 kg)       CMP ( most recent) CMP     Component Value Date/Time   NA 139 05/22/2024 0935   K 4.6 05/22/2024 0935   CL 104 05/22/2024 0935   CO2 19 (L) 05/22/2024 0935   GLUCOSE 110 (H) 05/22/2024 0935   GLUCOSE 127 (H) 02/22/2024 0857   BUN 21 05/22/2024 0935   CREATININE 1.39 (H) 05/22/2024 0935   CREATININE 1.22 09/09/2014 0910   CALCIUM  9.4 05/22/2024 0935   PROT 6.8 05/22/2024 0935   ALBUMIN 4.1 05/22/2024 0935   AST 13 05/22/2024 0935   ALT 11 05/22/2024 0935   ALKPHOS 82 05/22/2024 0935   BILITOT 0.2 05/22/2024 0935   GFRNONAA >60 02/22/2024 0857   GFRAA 57 (L) 11/18/2020 0830     Diabetic Labs (most recent): Lab Results  Component Value Date   HGBA1C 7.2 (A) 06/26/2024   HGBA1C 9.6 (H) 11/08/2023   HGBA1C 7.5 (H) 12/24/2022     Lipid Panel ( most recent) Lipid Panel     Component Value Date/Time   CHOL 244 (H) 05/22/2024 0935   TRIG 275 (H) 05/22/2024 0935   HDL 43 05/22/2024 0935   CHOLHDL 5.7 (H) 05/22/2024 0935   CHOLHDL 3.1 09/09/2014 0910   VLDL 17 09/09/2014 0910   LDLCALC 150 (H) 05/22/2024 0935   LABVLDL 51 (H) 05/22/2024 0935      Lab Results  Component Value Date   TSH 2.420 05/22/2024   TSH 2.440 12/10/2021   TSH 1.878 10/01/2014   FREET4 1.07 05/22/2024   FREET4 1.15 12/10/2021      Assessment & Plan:   1. Type 2 diabetes mellitus without complication, without long-term current use of insulin  (HCC)  - Philip Caldwell has currently uncontrolled symptomatic type 2 DM since  58 years of age.  He presents with his CGM device showing improved profile showing 73% time range, 25% in 1 hyperglycemia, 2% level 2 hyperglycemia.  He has no hypoglycemia.  His average blood glucose is 165 mg per DL for the most recent 2 weeks.  His point-of-care A1c is 7.2% improving progressively from 9.6%.     - Recent labs  reviewed. - I had a long discussion with him about the progressive nature of diabetes and the  pathology behind its complications. - His recent labs show increasing serum creatinine and estimated GFR at 58,  and  he  remains at a high risk for more acute and chronic complications which include CAD, CVA, CKD, retinopathy, and neuropathy. These are all discussed in detail with him.  - I have counseled him on diet  and weight management  by adopting a carbohydrate restricted/protein rich diet. Patient is encouraged to switch to  unprocessed or minimally processed     complex starch and increased protein intake (animal or plant source), fruits, and vegetables. -  he is advised to stick to a routine mealtimes to eat 3 meals  a day and avoid unnecessary snacks ( to snack only to correct hypoglycemia).   - he acknowledges that there is a room for improvement in his food and drink choices. - Suggestion is made for him to avoid simple carbohydrates  from his diet including Cakes, Sweet Desserts, Ice Cream, Soda (diet and regular), Sweet Tea, Candies, Chips, Cookies, Store Bought Juices, Alcohol , Artificial Sweeteners,  Coffee Creamer, and Sugar-free Products, Lemonade. This will help patient to have more stable blood glucose profile and potentially avoid unintended weight gain.  The following Lifestyle Medicine recommendations according to American College of Lifestyle Medicine  [facility]) were discussed and and offered to patient and he  agrees to start the journey:  A. Whole Foods, Plant-Based Nutrition comprising of fruits and vegetables, plant-based proteins, whole-grain carbohydrates was discussed in detail with the patient.   A list for source of those nutrients were also provided to the patient.  Patient will use only water  or unsweetened tea for hydration. B.  The need to stay away from risky substances including alcohol, smoking; obtaining 7 to 9 hours of restorative sleep, at least 150 minutes of moderate intensity exercise weekly, the importance of healthy social connections,  and stress  management techniques were discussed. C.  A full color page of  Calorie density of various food groups per pound showing examples of each food groups was provided to the patient.   - I have approached him with the following individualized plan to manage  his diabetes and patient agrees:   -He has not engaged in lifestyle medicine, however, responding to SGLT2 inhibitors intervention.   I advised him to continue Jardiance  25 mg p.o. daily at breakfast. -He is tolerating Mounjaro  at 2.5 mg, discussed and increased the dose to 5 mg subcutaneously weekly.  This medication will be advanced to the next higher dose as he tolerates.   - He is advised to continue metformin  500 mg p.o. twice daily, did not tolerate higher dose, and has now CKD stage 3a.   He is encouraged to utilize his freestyle libre 3 device continuously.   - he is encouraged to call clinic for blood glucose levels less than 70 or above 200 mg /dl. - Cardiorenal benefits of GLP-1 receptors agonists and SGLT2 inhibitors were discussed with him.   - Specific targets for  A1c;  LDL, HDL,  and Triglycerides were discussed with the patient.  2) Blood Pressure /Hypertension: His blood pressure is controlled to target.  he is allowed to lower his losartan  to 25 mg p.o. daily at breakfast.  3) Lipids/Hyperlipidemia: He did not take his Crestor , LDL increasing to 1 5457.  He is planning to restart Crestor  10 mg p.o. nightly.   Side effects  and precautions discussed with him.  His next labs will include fasting lipid panel.  4)  Weight/Diet:  Body mass index is 27.38 kg/m.  -     he is  a candidate for  some weight loss. I discussed with him the fact that loss of 5 - 10% of his  current body weight will have the most impact on his diabetes management.  Exercise, and detailed carbohydrates information provided  -  detailed on discharge instructions.  5) Chronic Care/Health Maintenance:  -he  is on ACEI/ARB  medications and  is encouraged  to initiate and continue to follow up with Ophthalmology, Dentist,  Podiatrist at least yearly or according to recommendations, and advised to   stay away from smoking. I have recommended yearly flu vaccine and pneumonia vaccine at least every 5 years; moderate intensity exercise for up to 150 minutes weekly; and  sleep for at least 7 hours a day.  - he is  advised to maintain close follow up with Cook, [name], DO for primary care needs, as well as his other providers for optimal and coordinated care.   I spent  41  minutes in the care of the patient today including review of labs from CMP, Lipids, Thyroid Function, Hematology (current and previous including abstractions from other facilities); face-to-face time discussing  his blood glucose readings/logs, discussing hypoglycemia and hyperglycemia episodes and symptoms, medications doses, his options of short and long term treatment based on the latest standards of care / guidelines;  discussion about incorporating lifestyle medicine;  and documenting the encounter. Risk reduction counseling performed per USPSTF guidelines to reduce  cardiovascular risk factors.     Please refer to Patient Instructions for Blood Glucose Monitoring and Insulin /Medications Dosing Guide  in media tab for additional information. Please  also refer to  Patient Self Inventory in the Media  tab for reviewed elements of pertinent patient history.  Philip Caldwell participated in the discussions, expressed understanding, and voiced agreement with the above plans.  All questions were answered to his satisfaction. he is encouraged to contact clinic should he have any questions or concerns prior to his return visit.  Follow up plan: - Return in about 6 months (around 12/27/2024) for F/U with Pre-visit Labs, Meter/CGM/Logs, A1c here.  Philip Earl, MD [facility] [address] [place], [place] [address] Phone: [phone]   Fax: [phone]    06/26/2024, 2:19 PM  This note was partially dictated with voice recognition software. Similar sounding words can be transcribed inadequately or may not  be corrected upon review.

## 2024-07-16 ENCOUNTER — Other Ambulatory Visit: Payer: Self-pay | Admitting: "Endocrinology

## 2024-08-15 ENCOUNTER — Other Ambulatory Visit: Payer: Self-pay | Admitting: Family Medicine

## 2024-08-15 DIAGNOSIS — N1831 Chronic kidney disease, stage 3a: Secondary | ICD-10-CM

## 2024-08-15 DIAGNOSIS — Z794 Long term (current) use of insulin: Secondary | ICD-10-CM

## 2024-08-15 DIAGNOSIS — I10 Essential (primary) hypertension: Secondary | ICD-10-CM

## 2024-09-13 ENCOUNTER — Telehealth: Payer: Self-pay | Admitting: "Endocrinology

## 2024-09-13 NOTE — Telephone Encounter (Signed)
 Patient needs a refill on his Mounjaro  but does not feel like he can increase due to the side effects of his nauseous, diarrhea. Please Advise. He is due for the next shot on Sunday then he will be out.

## 2024-09-14 ENCOUNTER — Telehealth: Payer: Self-pay | Admitting: "Endocrinology

## 2024-09-14 ENCOUNTER — Other Ambulatory Visit: Payer: Self-pay | Admitting: "Endocrinology

## 2024-09-14 MED ORDER — TIRZEPATIDE 2.5 MG/0.5ML ~~LOC~~ SOAJ: 2.5000 mg | SUBCUTANEOUS | 0 refills | Status: DC

## 2024-09-14 NOTE — Telephone Encounter (Signed)
 Pt wanted to discuss about CGM monitoring and then stated he would speak to provider in January

## 2024-09-14 NOTE — Telephone Encounter (Signed)
 Pt stopped by office and states never picked up the 5 mg, he would like to stay on the 2.5 and needs [facility] [place] so he can stay on the 2.5.  He is completely out of it.

## 2024-10-05 ENCOUNTER — Telehealth: Payer: Self-pay

## 2024-10-05 ENCOUNTER — Telehealth: Payer: Self-pay | Admitting: "Endocrinology

## 2024-10-05 ENCOUNTER — Other Ambulatory Visit (HOSPITAL_COMMUNITY): Payer: Self-pay

## 2024-10-05 NOTE — Telephone Encounter (Signed)
 Pharmacy Patient Advocate Encounter   Received notification from Pt Calls Messages that prior authorization for Dexcom G7 sensor is required/requested.   Insurance verification completed.   The patient is insured through [facility].   Per test claim: PA required; PA started via CoverMyMeds. KEY [id number] . Please see clinical question(s) below that I am not finding the answer to in their chart and advise.    Pa will not be approved if patient is not on insulin 

## 2024-10-05 NOTE — Telephone Encounter (Signed)
 Patient states his insurance is requiring a PA on his dexcom sensors

## 2024-10-09 NOTE — Telephone Encounter (Signed)
 Pt is not injecting insulin .

## 2024-10-10 NOTE — Telephone Encounter (Signed)
 Patient is not covered due to not injecting insulin . He does not want to stick his finger. I did ask him to call his insurance to see what meter is covered.

## 2024-10-15 ENCOUNTER — Other Ambulatory Visit: Payer: Self-pay

## 2024-10-15 DIAGNOSIS — E119 Type 2 diabetes mellitus without complications: Secondary | ICD-10-CM

## 2024-10-15 MED ORDER — ACCU-CHEK SOFTCLIX LANCETS MISC
2 refills | Status: AC
Start: 2024-10-15 — End: ?

## 2024-10-15 MED ORDER — ACCU-CHEK GUIDE TEST VI STRP: ORAL_STRIP | 2 refills | Status: AC

## 2024-10-19 ENCOUNTER — Encounter: Payer: Self-pay | Admitting: "Endocrinology

## 2024-10-22 ENCOUNTER — Other Ambulatory Visit: Payer: Self-pay

## 2024-10-22 DIAGNOSIS — E119 Type 2 diabetes mellitus without complications: Secondary | ICD-10-CM

## 2024-10-22 MED ORDER — DEXCOM G7 SENSOR MISC: 2 refills | Status: AC

## 2024-11-01 ENCOUNTER — Encounter: Payer: Self-pay | Admitting: Pharmacy Technician

## 2024-11-01 ENCOUNTER — Other Ambulatory Visit (HOSPITAL_COMMUNITY): Payer: Self-pay

## 2024-11-01 NOTE — Telephone Encounter (Signed)
 error

## 2024-11-26 ENCOUNTER — Emergency Department (HOSPITAL_COMMUNITY)

## 2024-11-26 ENCOUNTER — Other Ambulatory Visit: Payer: Self-pay

## 2024-11-26 ENCOUNTER — Other Ambulatory Visit: Payer: Self-pay | Admitting: "Endocrinology

## 2024-11-26 ENCOUNTER — Ambulatory Visit: Payer: Self-pay

## 2024-11-26 ENCOUNTER — Emergency Department (HOSPITAL_COMMUNITY)
Admission: EM | Admit: 2024-11-26 | Discharge: 2024-11-26 | Disposition: A | Discharge status: Home / Self Care | Attending: Emergency Medicine | Admitting: Emergency Medicine

## 2024-11-26 ENCOUNTER — Encounter (HOSPITAL_COMMUNITY): Payer: Self-pay | Admitting: *Deleted

## 2024-11-26 DIAGNOSIS — Z7984 Long term (current) use of oral hypoglycemic drugs: Secondary | ICD-10-CM | POA: Diagnosis not present

## 2024-11-26 DIAGNOSIS — R519 Headache, unspecified: Secondary | ICD-10-CM | POA: Diagnosis present

## 2024-11-26 DIAGNOSIS — Z794 Long term (current) use of insulin: Secondary | ICD-10-CM | POA: Diagnosis not present

## 2024-11-26 DIAGNOSIS — Z79899 Other long term (current) drug therapy: Secondary | ICD-10-CM | POA: Insufficient documentation

## 2024-11-26 DIAGNOSIS — N183 Chronic kidney disease, stage 3 unspecified: Secondary | ICD-10-CM | POA: Insufficient documentation

## 2024-11-26 DIAGNOSIS — G43809 Other migraine, not intractable, without status migrainosus: Secondary | ICD-10-CM | POA: Insufficient documentation

## 2024-11-26 DIAGNOSIS — E1122 Type 2 diabetes mellitus with diabetic chronic kidney disease: Secondary | ICD-10-CM | POA: Diagnosis not present

## 2024-11-26 DIAGNOSIS — I129 Hypertensive chronic kidney disease with stage 1 through stage 4 chronic kidney disease, or unspecified chronic kidney disease: Secondary | ICD-10-CM | POA: Insufficient documentation

## 2024-11-26 DIAGNOSIS — R030 Elevated blood-pressure reading, without diagnosis of hypertension: Secondary | ICD-10-CM

## 2024-11-26 DIAGNOSIS — I4891 Unspecified atrial fibrillation: Secondary | ICD-10-CM | POA: Insufficient documentation

## 2024-11-26 LAB — CBC WITH DIFFERENTIAL/PLATELET
Abs Immature Granulocytes: 0.03 K/uL (ref 0.00–0.07)
Basophils Absolute: 0 K/uL (ref 0.0–0.1)
Basophils Relative: 0 %
Eosinophils Absolute: 0.2 K/uL (ref 0.0–0.5)
Eosinophils Relative: 2 %
HCT: 48.8 % (ref 39.0–52.0)
Hemoglobin: 17.2 g/dL — ABNORMAL HIGH (ref 13.0–17.0)
Immature Granulocytes: 0 %
Lymphocytes Relative: 26 %
Lymphs Abs: 2.2 K/uL (ref 0.7–4.0)
MCH: 27.9 pg (ref 26.0–34.0)
MCHC: 35.2 g/dL (ref 30.0–36.0)
MCV: 79.1 fL — ABNORMAL LOW (ref 80.0–100.0)
Monocytes Absolute: 0.7 K/uL (ref 0.1–1.0)
Monocytes Relative: 8 %
Neutro Abs: 5.4 K/uL (ref 1.7–7.7)
Neutrophils Relative %: 64 %
Platelets: 330 K/uL (ref 150–400)
RBC: 6.17 MIL/uL — ABNORMAL HIGH (ref 4.22–5.81)
RDW: 15.4 % (ref 11.5–15.5)
WBC: 8.6 K/uL (ref 4.0–10.5)
nRBC: 0 % (ref 0.0–0.2)

## 2024-11-26 LAB — COMPREHENSIVE METABOLIC PANEL WITH GFR
ALT: 26 U/L (ref 0–44)
AST: 21 U/L (ref 15–41)
Albumin: 4.4 g/dL (ref 3.5–5.0)
Alkaline Phosphatase: 105 U/L (ref 38–126)
Anion gap: 14 (ref 5–15)
BUN: 13 mg/dL (ref 6–20)
CO2: 22 mmol/L (ref 22–32)
Calcium: 9.7 mg/dL (ref 8.9–10.3)
Chloride: 103 mmol/L (ref 98–111)
Creatinine, Ser: 1.35 mg/dL — ABNORMAL HIGH (ref 0.61–1.24)
GFR, Estimated: >60 mL/min
Glucose, Bld: 188 mg/dL — ABNORMAL HIGH (ref 70–99)
Potassium: 4.4 mmol/L (ref 3.5–5.1)
Sodium: 139 mmol/L (ref 135–145)
Total Bilirubin: 0.4 mg/dL (ref 0.0–1.2)
Total Protein: 7.6 g/dL (ref 6.5–8.1)

## 2024-11-26 MED ORDER — ACETAMINOPHEN 500 MG PO TABS
1000.0000 mg | ORAL_TABLET | Freq: Once | ORAL | Status: AC
  Administered 2024-11-26: 1000 mg via ORAL
  Filled 2024-11-26: qty 2

## 2024-11-26 MED ORDER — TIRZEPATIDE 5 MG/0.5ML ~~LOC~~ SOAJ: 5.0000 mg | SUBCUTANEOUS | 0 refills | Status: DC

## 2024-11-26 MED ORDER — DIPHENHYDRAMINE HCL 50 MG/ML IJ SOLN
25.0000 mg | Freq: Once | INTRAMUSCULAR | Status: AC
  Administered 2024-11-26: 25 mg via INTRAVENOUS
  Filled 2024-11-26: qty 1

## 2024-11-26 MED ORDER — SODIUM CHLORIDE 0.9 % IV BOLUS
500.0000 mL | Freq: Once | INTRAVENOUS | Status: AC
  Administered 2024-11-26: 500 mL via INTRAVENOUS

## 2024-11-26 MED ORDER — METOCLOPRAMIDE HCL 5 MG PO TABS: 5.0000 mg | ORAL_TABLET | Freq: Three times a day (TID) | ORAL | 0 refills | Status: AC | PRN

## 2024-11-26 MED ORDER — DEXAMETHASONE 4 MG PO TABS
8.0000 mg | ORAL_TABLET | Freq: Once | ORAL | Status: AC
  Administered 2024-11-26: 8 mg via ORAL
  Filled 2024-11-26: qty 2

## 2024-11-26 MED ORDER — MAGNESIUM SULFATE 2 GM/50ML IV SOLN
2.0000 g | Freq: Once | INTRAVENOUS | Status: AC
  Administered 2024-11-26: 2 g via INTRAVENOUS
  Filled 2024-11-26: qty 50

## 2024-11-26 MED ORDER — METOCLOPRAMIDE HCL 5 MG/ML IJ SOLN
5.0000 mg | Freq: Once | INTRAMUSCULAR | Status: AC
  Administered 2024-11-26: 5 mg via INTRAVENOUS
  Filled 2024-11-26: qty 2

## 2024-11-26 NOTE — ED Provider Notes (Signed)
 "  EMERGENCY DEPARTMENT AT [facility] Provider Note  CSN: [id number] Arrival date & time: 11/26/24 1310  Chief Complaint(s) Hypertension  HPI Philip Caldwell is a 58 y.o. male with past medical history as below, significant for arm pain with intermittent numbness, DM, hypertension, CKD stage III, A-fib who presents to the ED with complaint of headache  Patient reports that he woke up this morning around 9 AM, frontal, pressure, throbbing sensation.  No nausea or vomiting.  No vision or hearing changes.  No recent head trauma.  Began to have tingling sensation to his left arm over the past hour or so.  Have somewhat improved since the onset.  No weakness.  No gait disturbance.  No medication prior to arrival.  Denies similar headaches in the past.  Past Medical History Past Medical History:  Diagnosis Date   A-fib (HCC)    Arm pain, central    with intermittent numbness   Diabetes mellitus without complication (HCC)    Hypertension    Neck pain    Neuropathic pain 05/09/2023   Renal disorder    Tendonitis    Patient Active Problem List   Diagnosis Date Noted   Encounter for screening colonoscopy 11/21/2023   CKD (chronic kidney disease) stage 3, GFR 30-59 ml/min (HCC) 01/04/2022   Mixed hyperlipidemia 12/28/2021   Type 2 diabetes mellitus with diabetic chronic kidney disease (HCC) 11/06/2018   Essential hypertension, benign 10/14/2014   Home Medication(s) Prior to Admission medications  Medication Sig Start Date End Date Taking? Authorizing Provider  metoCLOPramide  (REGLAN ) 5 MG tablet Take 1 tablet (5 mg total) by mouth every 8 (eight) hours as needed (headache). 11/26/24  Yes Elnor Savant A, DO  Accu-Chek Softclix Lancets lancets Check glucose 3x per day. 10/15/24   Nida, Gebreselassie W, MD  Continuous Glucose Receiver (DEXCOM G7 RECEIVER) DEVI Use to monitor glucose continuously as directed. 01/30/24   Nida, Gebreselassie W, MD  Continuous Glucose Sensor  (DEXCOM G7 SENSOR) MISC Use to monitor glucose continuously as directed. Change sensor every 10 days. 10/22/24   Nida, Gebreselassie W, MD  glucose blood (ACCU-CHEK GUIDE TEST) test strip Use to check blood glucose three times daily as instructed 10/15/24   Nida, Gebreselassie W, MD  hydrocortisone  2.5 % ointment Apply 1 application topically 2 (two) times daily as needed (rash). 10/08/21   Alphonsa Glendia LABOR, MD  Insulin  Pen Needle 29G X MISC Use to inject insulin  daily 02/17/24   Nida, Gebreselassie W, MD  JARDIANCE  25 MG TABS tablet TAKE 1 TABLET BY MOUTH DAILY BEFORE BREAKFAST. 07/17/24   Nida, Gebreselassie W, MD  losartan  (COZAAR ) 25 MG tablet TAKE 2 TABLETS BY MOUTH EVERY DAY 08/15/24   Cook, [name], DO  metFORMIN  (GLUCOPHAGE ) 500 MG tablet Take 1 tablet (500 mg total) by mouth 2 (two) times daily with a meal. 11/15/23   Nida, Ethelle ORN, MD  rosuvastatin  (CRESTOR ) 10 MG tablet Take 1 tablet (10 mg total) by mouth daily. 11/21/23   Cook, [name], DO  tirzepatide  (MOUNJARO ) 5 MG/0.5ML Pen Inject 5 mg into the skin once a week. 11/26/24   Lenis Ethelle ORN, MD  Past Surgical History Past Surgical History:  Procedure Laterality Date   COLONOSCOPY N/A 10/08/2015   Procedure: COLONOSCOPY;  Surgeon: Claudis RAYMOND Rivet, MD;  Location: [facility];  Service: Endoscopy;  Laterality: N/A;  1030   COLONOSCOPY WITH PROPOFOL  N/A 02/24/2024   Procedure: COLONOSCOPY WITH PROPOFOL ;  Surgeon: Cindie Carlin POUR, DO;  Location: [facility];  Service: Endoscopy;  Laterality: N/A;  10:00 am, asa 3   CYSTOSCOPY WITH STENT PLACEMENT Left 05/25/2020   Procedure: CYSTOSCOPY WITH STENT PLACEMENT retrograde pylegram;  Surgeon: Carolee Sherwood JONETTA DOUGLAS, MD;  Location: [facility];  Service: Urology;  Laterality: Left;   NERVE SURGERY     POLYPECTOMY  02/24/2024   Procedure: POLYPECTOMY;   Surgeon: Cindie Carlin POUR, DO;  Location: [facility];  Service: Endoscopy;;   Family History Family History  Problem Relation Age of Onset   Thyroid disease Mother    Dementia Mother    Hypertension Father    Cancer Father    Heart attack Brother     Social History Social History[1] Allergies Lisinopril  and Penicillins  Review of Systems A thorough review of systems was obtained and all systems are negative except as noted in the HPI and PMH.   Physical Exam Vital Signs  I have reviewed the triage vital signs BP (!) 136/92 (BP Location: Right Arm)   Pulse 92   Temp 97.8 F (36.6 C) (Oral)   Resp 17   Ht 5' 9 (1.753 m)   Wt 81.6 kg   SpO2 99%   BMI 26.58 kg/m  Physical Exam Vitals and nursing note reviewed.  Constitutional:      General: He is not in acute distress.    Appearance: He is well-developed.  HENT:     Head: Normocephalic and atraumatic.     Right Ear: External ear normal.     Left Ear: External ear normal.     Mouth/Throat:     Mouth: Mucous membranes are moist.  Eyes:     General: No scleral icterus.    Extraocular Movements: Extraocular movements intact.     Pupils: Pupils are equal, round, and reactive to light.  Cardiovascular:     Rate and Rhythm: Normal rate and regular rhythm.     Pulses: Normal pulses.     Heart sounds: Normal heart sounds.  Pulmonary:     Effort: Pulmonary effort is normal. No respiratory distress.     Breath sounds: Normal breath sounds.  Abdominal:     General: Abdomen is flat.     Palpations: Abdomen is soft.     Tenderness: There is no abdominal tenderness.  Musculoskeletal:     Cervical back: No rigidity.     Right lower leg: No edema.     Left lower leg: No edema.  Skin:    General: Skin is warm and dry.     Capillary Refill: Capillary refill takes less than 2 seconds.  Neurological:     Mental Status: He is alert and oriented to person, place, and time.     GCS: GCS eye subscore is 4. GCS verbal  subscore is 5. GCS motor subscore is 6.     Cranial Nerves: Cranial nerves 2-12 are intact. No dysarthria or facial asymmetry.     Sensory: Sensation is intact. No sensory deficit.     Motor: Motor function is intact. No weakness or tremor.     Coordination: Coordination is intact. Finger-Nose-Finger Test normal.     Gait: Gait  is intact.     Comments: Strength 5/5 to BLUE/BLLE, equal and symmetric  Intermittent paresthesias to LUE, no weakness, no numbness   Psychiatric:        Mood and Affect: Mood normal.        Behavior: Behavior normal.     ED Results and Treatments Labs (all labs ordered are listed, but only abnormal results are displayed) Labs Reviewed  CBC WITH DIFFERENTIAL/PLATELET - Abnormal; Notable for the following components:      Result Value   RBC 6.17 (*)    Hemoglobin 17.2 (*)    MCV 79.1 (*)    All other components within normal limits  COMPREHENSIVE METABOLIC PANEL WITH GFR - Abnormal; Notable for the following components:   Glucose, Bld 188 (*)    Creatinine, Ser 1.35 (*)    All other components within normal limits                                                                                                                          Radiology CT Head Wo Contrast Result Date: 11/26/2024 EXAM: CT HEAD WITHOUT CONTRAST 11/26/2024 04:20:16 PM TECHNIQUE: CT of the head was performed without the administration of intravenous contrast. Automated exposure control, iterative reconstruction, and/or weight based adjustment of the mA/kV was utilized to reduce the radiation dose to as low as reasonably achievable. COMPARISON: None available. CLINICAL HISTORY: Headache, new onset (Age >= 51y) FINDINGS: BRAIN AND VENTRICLES: No acute hemorrhage. No evidence of acute infarct. No hydrocephalus. No extra-axial collection. No mass effect or midline shift. ORBITS: No acute abnormality. SINUSES: No acute abnormality. SOFT TISSUES AND SKULL: Well aerated mastoid air cells. No acute  soft tissue abnormality. No skull fracture. IMPRESSION: 1. No acute intracranial abnormality. Electronically signed by: Donnice Mania MD 11/26/2024 04:40 PM EST RP Workstation: [id number]    Pertinent labs & imaging results that were available during my care of the patient were reviewed by me and considered in my medical decision making (see MDM for details).  Medications Ordered in ED Medications  dexamethasone  (DECADRON ) tablet 8 mg (has no administration in time range)  metoCLOPramide  (REGLAN ) injection 5 mg (5 mg Intravenous Given 11/26/24 1459)  diphenhydrAMINE  (BENADRYL ) injection 25 mg (25 mg Intravenous Given 11/26/24 1458)  magnesium  sulfate IVPB 2 g 50 mL (0 g Intravenous Stopped 11/26/24 1601)  sodium chloride  0.9 % bolus 500 mL (0 mLs Intravenous Stopped 11/26/24 1656)  acetaminophen  (TYLENOL ) tablet 1,000 mg (1,000 mg Oral Given 11/26/24 1453)  Procedures Procedures  (including critical care time)  Medical Decision Making / ED Course    Medical Decision Making:    Philip Caldwell is a 58 y.o. male  with past medical history as below, significant for arm pain with intermittent numbness, DM, hypertension, CKD stage III, A-fib who presents to the ED with complaint of headache. The complaint involves an extensive differential diagnosis and also carries with it a high risk of complications and morbidity.  Serious etiology was considered. Ddx includes but is not limited to: Differential diagnosis includes but is not exclusive to subarachnoid hemorrhage, meningitis, encephalitis, previous head trauma, cavernous venous thrombosis, muscle tension headache, glaucoma, temporal arteritis, migraine or migraine equivalent, etc.   Complete initial physical exam performed, notably the patient was in no acute distress.    Reviewed and confirmed nursing  documentation for past medical history, family history, social history.  Vital signs reviewed.    Headache> - Headache woke him up from sleep around 9 AM this morning.  Intermittent tingling to his left arm, paresthesias.  No weakness. No speech or vision changes, no sensation change o/w to extremities - labs with mild dehydration/hemoconcentration.  Given IV fluids. - CTH stable - migraine cocktail> resolved followed cocktail  Elevated blood pressure reading> - Patient ports elevated blood pressure reading at home, has improved in the ER.  Likely secondary to headache.  No chest pain or syncope.  No neurologic deficits.  Doubt hypertensive emergency   Clinical Course as of 11/26/24 1751  Mon Nov 26, 2024  1642 Symptoms improved  [SG]  1656 Creatinine(!): 1.35 Similar to prior  [SG]  1745 Headache resolved, tingling sensation resolved Neuro non-focal  [SG]    Clinical Course User Index [SG] Elnor Jayson LABOR, DO    Patient presents with headache. Based on the patient's history and physical there is very low clinical suspicion for significant intracranial pathology. The headache was not sudden onset, not maximal at onset, there are no neurologic findings on exam, the patient does not have a fever, the patient does not have any jaw claudication, the patient does not endorse a clotting disorder, patient denies any trauma or eye pain and the headache is not associated with dizziness, weakness on one side of the body, diplopia, vertigo, slurred speech, or ataxia. Given the extremely low risk of these diagnoses further testing and evaluation for these possibilities does not appear to be indicated at this time.   5:51 PM:  I have discussed the diagnosis/risks/treatment options with the patient and family.  Evaluation and diagnostic testing in the emergency department does not suggest an emergent condition requiring admission or immediate intervention beyond what has been performed at this time.   They will follow up with PCP. We also discussed returning to the ED immediately if new or worsening sx occur. We discussed the sx which are most concerning (e.g., sudden worsening pain, fever, inability to tolerate by mouth) that necessitate immediate return.    The patient appears reasonably screened and/or stabilized for discharge and I doubt any other medical condition or other [facility] requiring further screening, evaluation, or treatment in the ED at this time prior to discharge.                 Additional history obtained: -Additional history obtained from family -External records from outside source obtained and reviewed including: Chart review including previous notes, labs, imaging, consultation notes including  Home medications, allergy list, primary care documentation   Lab Tests: -I ordered, reviewed,  and interpreted labs.   The pertinent results include:   Labs Reviewed  CBC WITH DIFFERENTIAL/PLATELET - Abnormal; Notable for the following components:      Result Value   RBC 6.17 (*)    Hemoglobin 17.2 (*)    MCV 79.1 (*)    All other components within normal limits  COMPREHENSIVE METABOLIC PANEL WITH GFR - Abnormal; Notable for the following components:   Glucose, Bld 188 (*)    Creatinine, Ser 1.35 (*)    All other components within normal limits    Notable for as above, stable  EKG   EKG Interpretation Date/Time:    Ventricular Rate:    PR Interval:    QRS Duration:    QT Interval:    QTC Calculation:   R Axis:      Text Interpretation:           Imaging Studies ordered: I ordered imaging studies including CT head I independently visualized the following imaging with scope of interpretation limited to determining acute life threatening conditions related to emergency care; findings noted above I agree with the radiologist interpretation If any imaging was obtained with contrast I closely monitored patient for any possible adverse reaction a/w  contrast administration in the emergency department   Medicines ordered and prescription drug management: Meds ordered this encounter  Medications   metoCLOPramide  (REGLAN ) injection 5 mg   diphenhydrAMINE  (BENADRYL ) injection 25 mg   magnesium  sulfate IVPB 2 g 50 mL   sodium chloride  0.9 % bolus 500 mL   acetaminophen  (TYLENOL ) tablet 1,000 mg   dexamethasone  (DECADRON ) tablet 8 mg   metoCLOPramide  (REGLAN ) 5 MG tablet    Sig: Take 1 tablet (5 mg total) by mouth every 8 (eight) hours as needed (headache).    Dispense:  12 tablet    Refill:  0    -I have reviewed the patients home medicines and have made adjustments as needed   Consultations Obtained: Not applicable  Cardiac Monitoring: Continuous pulse oximetry interpreted by myself, 99% on room air.    Social Determinants of Health:  Diagnosis or treatment significantly limited by social determinants of health: Not applicable   Reevaluation: After the interventions noted above, I reevaluated the patient and found that they have resolved  Co morbidities that complicate the patient evaluation  Past Medical History:  Diagnosis Date   A-fib (HCC)    Arm pain, central    with intermittent numbness   Diabetes mellitus without complication (HCC)    Hypertension    Neck pain    Neuropathic pain 05/09/2023   Renal disorder    Tendonitis       Dispostion: Disposition decision including need for hospitalization was considered, and patient discharged from emergency department.    Final Clinical Impression(s) / ED Diagnoses Final diagnoses:  Other migraine without status migrainosus, not intractable  Elevated blood pressure reading         [1]  Social History Tobacco Use   Smoking status: Never   Smokeless tobacco: Never  Vaping Use   Vaping status: Never Used  Substance Use Topics   Alcohol use: No   Drug use: No     Elnor Jayson LABOR, DO 11/26/24 1751  "

## 2024-11-26 NOTE — Telephone Encounter (Signed)
 FYI Only or Action Required?: FYI only for provider: Pt being evaluated at the ED now.  Patient was last seen in primary care on 11/21/2023 by Cook, [name], DO.  Called Nurse Triage reporting Headache.  Symptoms began today.  Interventions attempted: Nothing.  Symptoms are: gradually worsening.  Triage Disposition: Go to ED Now (Notify PCP)  Patient/caregiver understands and will follow disposition?: Yes  Reason for Disposition  [1] SEVERE headache (e.g., excruciating) AND [2] worst headache of life  Answer Assessment - Initial Assessment Questions Pt calling in with severe headache. Pt sitting in the ED, calling to see if he can be scheduled in the clinic bc pt did not want to wait. Writer was triaging the pt and pt was called back by the nurse in the ED. Pt disconnected call. Pt is being evaluated at the ED.   1. LOCATION: Where does it hurt?      headache 2. ONSET: When did the headache start? (e.g., minutes, hours, days)      This morning - 10AM 11/26/24 3. PATTERN: Does the pain come and go, or has it been constant since it started?     constant 4. SEVERITY: How bad is the pain? and What does it keep you from doing?  (e.g., Scale 1-10; mild, moderate, or severe)     10/10 5. RECURRENT SYMPTOM: Have you ever had headaches before? If Yes, ask: When was the last time? and What happened that time?      Denies  6. CAUSE: What do you think is causing the headache?     Pt thinks it is his BP  7. MIGRAINE: Have you been diagnosed with migraine headaches? If Yes, ask: Is this headache similar?      N/a  8. HEAD INJURY: Has there been any recent injury to your head?      denies 9. OTHER SYMPTOMS: Do you have any other symptoms? (e.g., fever, stiff neck, eye pain, sore throat, cold symptoms)     Eyes hurting, pain in the forehead  Protocols used: [facility]  Copied from CRM #[id number]. Topic: Clinical - Red Word Triage >> Nov 26, 2024  1:34 PM  Brittany M wrote: Red Word that prompted transfer to Nurse Triage: Blood pressure reading at 165/115 currently in the ER- he is being told to wait- he has been there for almost an hour.

## 2024-11-26 NOTE — Discharge Instructions (Addendum)
 It was a pleasure caring for you today in the emergency department.  Be sure to follow-up with your primary care provider in the next week or so for recheck your headache symptoms and also your blood pressure.  Recommend you follow a low-salt or DASH diet in the meantime as outlined in handout.  Please return to the emergency department for any worsening or worrisome symptoms including but not limited to: Severe headache, weakness or numbness to one-sided your body, vision changes, chest pain or difficulty breathing, etc.

## 2024-11-26 NOTE — ED Notes (Signed)
 Pt c/o left arm feeling numb for the past hour.

## 2024-11-26 NOTE — ED Triage Notes (Signed)
 Pt with elevated BP x 1,  165/115. Woke up with HA and HA has continued

## 2024-12-12 LAB — COMPREHENSIVE METABOLIC PANEL WITH GFR
ALT: 26 IU/L (ref 0–44)
AST: 22 IU/L (ref 0–40)
Albumin: 4.5 g/dL (ref 3.8–4.9)
Alkaline Phosphatase: 98 IU/L (ref 47–123)
BUN/Creatinine Ratio: 8 — ABNORMAL LOW (ref 10–24)
BUN: 12 mg/dL (ref 8–27)
Bilirubin Total: 0.5 mg/dL (ref 0.0–1.2)
CO2: 22 mmol/L (ref 20–29)
Calcium: 10.4 mg/dL — ABNORMAL HIGH (ref 8.6–10.2)
Chloride: 102 mmol/L (ref 96–106)
Creatinine, Ser: 1.58 mg/dL — ABNORMAL HIGH (ref 0.76–1.27)
Globulin, Total: 3 g/dL (ref 1.5–4.5)
Glucose: 150 mg/dL — ABNORMAL HIGH (ref 70–99)
Potassium: 5 mmol/L (ref 3.5–5.2)
Sodium: 138 mmol/L (ref 134–144)
Total Protein: 7.5 g/dL (ref 6.0–8.5)
eGFR: 50 mL/min/1.73 — ABNORMAL LOW

## 2024-12-12 LAB — FIB-4 W/REFLEX TO ELF
FIB-4 Index: 0.78 (ref 0.00–2.67)
Platelets: 333 x10E3/uL (ref 150–450)

## 2024-12-12 LAB — LIPID PANEL
Chol/HDL Ratio: 3 ratio (ref 0.0–5.0)
Cholesterol, Total: 152 mg/dL (ref 100–199)
HDL: 50 mg/dL
LDL Chol Calc (NIH): 65 mg/dL (ref 0–99)
Triglycerides: 228 mg/dL — ABNORMAL HIGH (ref 0–149)
VLDL Cholesterol Cal: 37 mg/dL (ref 5–40)

## 2024-12-26 ENCOUNTER — Encounter: Payer: Self-pay | Admitting: "Endocrinology

## 2024-12-26 ENCOUNTER — Ambulatory Visit (INDEPENDENT_AMBULATORY_CARE_PROVIDER_SITE_OTHER): Admitting: "Endocrinology

## 2024-12-26 VITALS — BP 118/78 | HR 84 | Ht 69.0 in | Wt 181.2 lb

## 2024-12-26 DIAGNOSIS — Z7984 Long term (current) use of oral hypoglycemic drugs: Secondary | ICD-10-CM

## 2024-12-26 DIAGNOSIS — N183 Chronic kidney disease, stage 3 unspecified: Secondary | ICD-10-CM

## 2024-12-26 DIAGNOSIS — E782 Mixed hyperlipidemia: Secondary | ICD-10-CM

## 2024-12-26 DIAGNOSIS — N1831 Chronic kidney disease, stage 3a: Secondary | ICD-10-CM | POA: Diagnosis not present

## 2024-12-26 DIAGNOSIS — I10 Essential (primary) hypertension: Secondary | ICD-10-CM

## 2024-12-26 DIAGNOSIS — E1122 Type 2 diabetes mellitus with diabetic chronic kidney disease: Secondary | ICD-10-CM

## 2024-12-26 LAB — POCT GLYCOSYLATED HEMOGLOBIN (HGB A1C): HbA1c, POC (controlled diabetic range): 9.2 % — AB (ref 0.0–7.0)

## 2024-12-26 MED ORDER — TIRZEPATIDE 7.5 MG/0.5ML ~~LOC~~ SOAJ: 7.5000 mg | SUBCUTANEOUS | 0 refills | Status: AC

## 2024-12-26 NOTE — Progress Notes (Signed)
 "                                                                            12/26/2024, 10:42 AM  Endocrinology follow-up note   Subjective:    Patient ID: Philip Caldwell, male    DOB: [birth year].  Philip Caldwell is being seen in follow-up after he was seen in consultation for management of currently uncontrolled symptomatic diabetes requested by  Cook, [name], DO.   Past Medical History:  Diagnosis Date   A-fib [facility])    Arm pain, central    with intermittent numbness   Diabetes mellitus without complication (HCC)    Hypertension    Neck pain    Neuropathic pain 05/09/2023   Renal disorder    Tendonitis     Past Surgical History:  Procedure Laterality Date   COLONOSCOPY N/A 10/08/2015   Procedure: COLONOSCOPY;  Surgeon: Claudis RAYMOND Rivet, MD;  Location: [facility];  Service: Endoscopy;  Laterality: N/A;  1030   COLONOSCOPY WITH PROPOFOL  N/A 02/24/2024   Procedure: COLONOSCOPY WITH PROPOFOL ;  Surgeon: Cindie Carlin POUR, DO;  Location: [facility];  Service: Endoscopy;  Laterality: N/A;  10:00 am, asa 3   CYSTOSCOPY WITH STENT PLACEMENT Left 05/25/2020   Procedure: CYSTOSCOPY WITH STENT PLACEMENT retrograde pylegram;  Surgeon: Carolee Sherwood JONETTA DOUGLAS, MD;  Location: [facility];  Service: Urology;  Laterality: Left;   NERVE SURGERY     POLYPECTOMY  02/24/2024   Procedure: POLYPECTOMY;  Surgeon: Cindie Carlin POUR, DO;  Location: [facility];  Service: Endoscopy;;    Social History   Socioeconomic History   Marital status: Married    Spouse name: Not on file   Number of children: Not on file   Years of education: Not on file   Highest education level: Not on file  Occupational History   Not on file  Tobacco Use   Smoking status: Never   Smokeless tobacco: Never  Vaping Use   Vaping status: Never Used  Substance and Sexual Activity   Alcohol use: No   Drug use: No   Sexual activity: Yes    Birth control/protection: None  Other Topics Concern   Not on file  Social History  Narrative   Not on file   Social Drivers of Health   Tobacco Use: Low Risk (12/26/2024)   Patient History    Smoking Tobacco Use: Never    Smokeless Tobacco Use: Never    Passive Exposure: Not on file  Financial Resource Strain: Not on file  Food Insecurity: Not on file  Transportation Needs: Not on file  Physical Activity: Not on file  Stress: Not on file  Social Connections: Unknown (04/13/2022)   Received from [facility]   Social Network    Social Network: Not on file  Depression (PHQ2-9): Low Risk (11/21/2023)   Depression (PHQ2-9)    PHQ-2 Score: 0  Alcohol Screen: Not on file  Housing: Not on file  Utilities: Not on file  Health Literacy: Not on file    Family History  Problem Relation Age of Onset   Thyroid disease Mother    Dementia Mother    Hypertension Father  Cancer Father    Heart attack Brother     Outpatient Encounter Medications as of 12/26/2024  Medication Sig   tirzepatide  (MOUNJARO ) 7.5 MG/0.5ML Pen Inject 7.5 mg into the skin once a week.   Accu-Chek Softclix Lancets lancets Check glucose 3x per day.   Continuous Glucose Receiver (DEXCOM G7 RECEIVER) DEVI Use to monitor glucose continuously as directed.   Continuous Glucose Sensor (DEXCOM G7 SENSOR) MISC Use to monitor glucose continuously as directed. Change sensor every 10 days.   glucose blood (ACCU-CHEK GUIDE TEST) test strip Use to check blood glucose three times daily as instructed   hydrocortisone  2.5 % ointment Apply 1 application topically 2 (two) times daily as needed (rash).   Insulin  Pen Needle 29G X MISC Use to inject insulin  daily   JARDIANCE  25 MG TABS tablet TAKE 1 TABLET BY MOUTH DAILY BEFORE BREAKFAST.   losartan  (COZAAR ) 25 MG tablet TAKE 2 TABLETS BY MOUTH EVERY DAY (Patient not taking: Reported on 12/26/2024)   metFORMIN  (GLUCOPHAGE ) 500 MG tablet Take 1 tablet (500 mg total) by mouth 2 (two) times daily with a meal.   metoCLOPramide  (REGLAN ) 5 MG tablet Take 1 tablet (5  mg total) by mouth every 8 (eight) hours as needed (headache).   rosuvastatin  (CRESTOR ) 10 MG tablet Take 1 tablet (10 mg total) by mouth daily.   [DISCONTINUED] tirzepatide  (MOUNJARO ) 5 MG/0.5ML Pen Inject 5 mg into the skin once a week.   No facility-administered encounter medications on file as of 12/26/2024.    ALLERGIES: Allergies  Allergen Reactions   Lisinopril  Cough   Penicillins Rash    Has patient had a PCN reaction causing immediate rash, facial/tongue/throat swelling, SOB or lightheadedness with hypotension: Yes Has patient had a PCN reaction causing severe rash involving mucus membranes or skin necrosis: No Has patient had a PCN reaction that required hospitalization No Has patient had a PCN reaction occurring within the last 10 years: No If all of the above answers are NO, then may proceed with Cephalosporin use.      VACCINATION STATUS: Immunization History  Administered Date(s) Administered   Influenza,inj,Quad PF,6+ Mos 10/03/2017, 11/06/2018, 09/07/2019   Moderna Sars-Covid-2 Vaccination 01/27/2020, 02/21/2020, 10/31/2020   Pneumococcal Polysaccharide-23 11/06/2018    Diabetes He presents for his follow-up diabetic visit. He has type 2 diabetes mellitus. Onset time: he was diagnosed at approx age of 29 years. His disease course has been worsening (Patient missed his appointment since January 2023, returns with worsening glycemic profile.). There are no hypoglycemic associated symptoms. Pertinent negatives for hypoglycemia include no confusion, headaches, pallor or seizures. Associated symptoms include polydipsia and polyuria. Pertinent negatives for diabetes include no chest pain, no fatigue, no polyphagia and no weakness. There are no hypoglycemic complications. Symptoms are worsening. Diabetic complications include nephropathy. Risk factors for coronary artery disease include dyslipidemia, diabetes mellitus, hypertension and male sex. His weight is decreasing  steadily. He is following a generally unhealthy diet. He has not had a previous visit with a dietitian. He participates in exercise intermittently. His home blood glucose trend is increasing steadily. His breakfast blood glucose range is generally 180-200 mg/dl. His lunch blood glucose range is generally 180-200 mg/dl. His dinner blood glucose range is generally 180-200 mg/dl. His bedtime blood glucose range is generally 180-200 mg/dl. His overall blood glucose range is 180-200 mg/dl. (He has lost coverage for continued CGM utility.  Until November 03, 2024 he had Dexcom which showed 35% time in range, 46% level 1 hyperglycemia, 19% level  2 hyperglycemia.  His point-of-care A1c is at 9.2% increasing from 7.2% during his last visit.  His meter averages 181 mg per DL for the most recent 14 days.   He has no hypoglycemia reported or documented.    ) An ACE inhibitor/angiotensin II receptor blocker is being taken. Eye exam is current.  Hyperlipidemia This is a chronic problem. The current episode started more than 1 year ago. The problem is uncontrolled. Exacerbating diseases include diabetes. Pertinent negatives include no chest pain, myalgias or shortness of breath. He is currently on no antihyperlipidemic treatment. Risk factors for coronary artery disease include dyslipidemia, diabetes mellitus, hypertension and male sex.  Hypertension This is a chronic problem. The current episode started more than 1 year ago. The problem is controlled. Pertinent negatives include no chest pain, headaches, neck pain, palpitations or shortness of breath. Risk factors for coronary artery disease include dyslipidemia, diabetes mellitus and male gender. Past treatments include angiotensin blockers.     Objective:       12/26/2024   10:17 AM 11/26/2024    6:00 PM 11/26/2024    1:44 PM  Vitals with BMI  Height 5' 9  5' 9  Weight 181 lbs 3 oz  180 lbs  BMI 26.75  26.57  Systolic 118 117 863  Diastolic 78 68 92   Pulse 84 88 92    BP 118/78   Pulse 84   Ht 5' 9 (1.753 m)   Wt 181 lb 3.2 oz (82.2 kg)   BMI 26.76 kg/m   Wt Readings from Last 3 Encounters:  12/26/24 181 lb 3.2 oz (82.2 kg)  11/26/24 180 lb (81.6 kg)  06/26/24 185 lb 6.4 oz (84.1 kg)       CMP ( most recent) CMP     Component Value Date/Time   NA 138 12/11/2024 1018   K 5.0 12/11/2024 1018   CL 102 12/11/2024 1018   CO2 22 12/11/2024 1018   GLUCOSE 150 (H) 12/11/2024 1018   GLUCOSE 188 (H) 11/26/2024 1432   BUN 12 12/11/2024 1018   CREATININE 1.58 (H) 12/11/2024 1018   CREATININE 1.22 09/09/2014 0910   CALCIUM  10.4 (H) 12/11/2024 1018   PROT 7.5 12/11/2024 1018   ALBUMIN 4.5 12/11/2024 1018   AST 22 12/11/2024 1018   ALT 26 12/11/2024 1018   ALKPHOS 98 12/11/2024 1018   BILITOT 0.5 12/11/2024 1018   GFRNONAA >60 11/26/2024 1432   GFRAA 57 (L) 11/18/2020 0830     Diabetic Labs (most recent): Lab Results  Component Value Date   HGBA1C 9.2 (A) 12/26/2024   HGBA1C 7.2 (A) 06/26/2024   HGBA1C 9.6 (H) 11/08/2023     Lipid Panel ( most recent) Lipid Panel     Component Value Date/Time   CHOL 152 12/11/2024 1018   TRIG 228 (H) 12/11/2024 1018   HDL 50 12/11/2024 1018   CHOLHDL 3.0 12/11/2024 1018   CHOLHDL 3.1 09/09/2014 0910   VLDL 17 09/09/2014 0910   LDLCALC 65 12/11/2024 1018   LABVLDL 37 12/11/2024 1018      Lab Results  Component Value Date   TSH 2.420 05/22/2024   TSH 2.440 12/10/2021   TSH 1.878 10/01/2014   FREET4 1.07 05/22/2024   FREET4 1.15 12/10/2021      Assessment & Plan:   1. 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  He has lost  coverage for continued CGM utility.  Until November 03, 2024 he had Dexcom which showed 35% time in range, 46% level 1 hyperglycemia, 19% level 2 hyperglycemia.  His point-of-care A1c is at 9.2% increasing from 7.2% during his last visit.  His meter averages  181 mg per DL for the most recent 14 days.   He has no hypoglycemia reported or documented.  - Recent labs reviewed. - I had a long discussion with him about the progressive nature of diabetes and the pathology behind its complications. - His recent labs show increasing serum creatinine and estimated GFR at 58,  and  he  remains at a high risk for more acute and chronic complications which include CAD, CVA, CKD, retinopathy, and neuropathy. These are all discussed in detail with him.  - I have counseled him on diet  and weight management  by adopting a carbohydrate restricted/protein rich diet. Patient is encouraged to switch to  unprocessed or minimally processed     complex starch and increased protein intake (animal or plant source), fruits, and vegetables. -  he is advised to stick to a routine mealtimes to eat 3 meals  a day and avoid unnecessary snacks ( to snack only to correct hypoglycemia).   - he acknowledges that there is a room for improvement in his food and drink choices. - Suggestion is made for him to avoid simple carbohydrates  from his diet including Cakes, Sweet Desserts, Ice Cream, Soda (diet and regular), Sweet Tea, Candies, Chips, Cookies, Store Bought Juices, Alcohol , Artificial Sweeteners,  Coffee Creamer, and Sugar-free Products, Lemonade. This will help patient to have more stable blood glucose profile and potentially avoid unintended weight gain.  The following Lifestyle Medicine recommendations according to American College of Lifestyle Medicine  [facility]) were discussed and and offered to patient and he  agrees to start the journey:  A. Whole Foods, Plant-Based Nutrition comprising of fruits and vegetables, plant-based proteins, whole-grain carbohydrates was discussed in detail with the patient.   A list for source of those nutrients were also provided to the patient.  Patient will use only water  or unsweetened tea for hydration. B.  The need to stay away from risky  substances including alcohol, smoking; obtaining 7 to 9 hours of restorative sleep, at least 150 minutes of moderate intensity exercise weekly, the importance of healthy social connections,  and stress management techniques were discussed. C.  A full color page of  Calorie density of various food groups per pound showing examples of each food groups was provided to the patient.   - I have approached him with the following individualized plan to manage  his diabetes and patient agrees:   -He has not engaged in lifestyle medicine, however, responding to SGLT2 inhibitors and GLP-1 receptor agonist intervention.   I advised him to continue Jardiance  25 mg p.o. daily at breakfast. -He is tolerating Mounjaro  at 5 mg subcutaneous weekly, discussed and increase his dose to 7.5 mg subcutaneously weekly.   This medication will be advanced to the next higher dose as he tolerates.   - He is advised to continue metformin  500 mg p.o. twice daily, did not tolerate higher dose, and has now CKD stage 3a.  -Since he lost coverage for CGM, advised to monitor blood glucose twice a day times daily before breakfast and at bedtime.  - he is encouraged to call clinic for blood glucose levels less than 70 or above 200 mg /dl. - Cardiorenal benefits of GLP-1 receptors  agonists and SGLT2 inhibitors were discussed with him.   - Specific targets for  A1c;  LDL, HDL,  and Triglycerides were discussed with the patient.  2) Blood Pressure /Hypertension: His blood pressure is controlled to target.  he is allowed to lower his losartan  to 25 mg p.o. daily at breakfast.  3) Lipids/Hyperlipidemia: He is taking his Crestor  this time with the improved lipid panel with LDL at 65 improving from 150.    Side effects and precautions discussed with him.   4)  Weight/Diet:  Body mass index is 26.76 kg/m.  -     he is  a candidate for  some weight loss. I discussed with him the fact that loss of 5 - 10% of his  current body weight will  have the most impact on his diabetes management.  Exercise, and detailed carbohydrates information provided  -  detailed on discharge instructions.  5) Chronic Care/Health Maintenance:  -he  is on ACEI/ARB  medications and  is encouraged to initiate and continue to follow up with Ophthalmology, Dentist,  Podiatrist at least yearly or according to recommendations, and advised to   stay away from smoking. I have recommended yearly flu vaccine and pneumonia vaccine at least every 5 years; moderate intensity exercise for up to 150 minutes weekly; and  sleep for at least 7 hours a day.  - he is  advised to maintain close follow up with Cook, [name], DO for primary care needs, as well as his other providers for optimal and coordinated care.   I spent  26  minutes in the care of the patient today including review of labs from CMP, Lipids, Thyroid Function, Hematology (current and previous including abstractions from other facilities); face-to-face time discussing  his blood glucose readings/logs, discussing hypoglycemia and hyperglycemia episodes and symptoms, medications doses, his options of short and long term treatment based on the latest standards of care / guidelines;  discussion about incorporating lifestyle medicine;  and documenting the encounter. Risk reduction counseling performed per USPSTF guidelines to reduce  cardiovascular risk factors.     Please refer to Patient Instructions for Blood Glucose Monitoring and Insulin /Medications Dosing Guide  in media tab for additional information. Please  also refer to  Patient Self Inventory in the Media  tab for reviewed elements of pertinent patient history.  Philip Caldwell participated in the discussions, expressed understanding, and voiced agreement with the above plans.  All questions were answered to his satisfaction. he is encouraged to contact clinic should he have any questions or concerns prior to his return visit. Dear Patient: Feel free to  review your progress notes.  If you are reviewing this progress note and have questions about the meaning of /or medical terms being used, please make a note and address it at your next follow-up appointment.  Medical notes are meant to be a communication tool between medical professionals and require medical terms to be used for efficiency and insurance approval.   Follow up plan: - Return in about 3 months (around 03/26/2025) for F/U with Pre-visit Labs, Meter/CGM/Logs, A1c here.  Philip Earl, MD [facility] [address] [place], [place] [address] Phone: [phone]  Fax: [phone]    12/26/2024, 10:42 AM  This note was partially dictated with voice recognition software. Similar sounding words can be transcribed inadequately or may not  be corrected upon review.   "

## 2024-12-26 NOTE — Patient Instructions (Signed)

## 2025-03-26 ENCOUNTER — Ambulatory Visit: Admitting: "Endocrinology
